# Patient Record
Sex: Female | Born: 1945
Health system: Southern US, Community
[De-identification: ages and names within clinical notes are randomized; demographics above are authoritative.]

## PROBLEM LIST (undated history)

## (undated) DIAGNOSIS — I10 Essential (primary) hypertension: Secondary | ICD-10-CM

## (undated) DIAGNOSIS — R42 Dizziness and giddiness: Secondary | ICD-10-CM

## (undated) DIAGNOSIS — K219 Gastro-esophageal reflux disease without esophagitis: Secondary | ICD-10-CM

## (undated) DIAGNOSIS — J45909 Unspecified asthma, uncomplicated: Secondary | ICD-10-CM

## (undated) DIAGNOSIS — E678 Other specified hyperalimentation: Secondary | ICD-10-CM

## (undated) DIAGNOSIS — Z8489 Family history of other specified conditions: Secondary | ICD-10-CM

## (undated) DIAGNOSIS — J449 Chronic obstructive pulmonary disease, unspecified: Secondary | ICD-10-CM

## (undated) DIAGNOSIS — M1610 Unilateral primary osteoarthritis, unspecified hip: Secondary | ICD-10-CM

## (undated) DIAGNOSIS — K900 Celiac disease: Secondary | ICD-10-CM

## (undated) DIAGNOSIS — J309 Allergic rhinitis, unspecified: Secondary | ICD-10-CM

## (undated) DIAGNOSIS — J328 Other chronic sinusitis: Secondary | ICD-10-CM

## (undated) DIAGNOSIS — M1990 Unspecified osteoarthritis, unspecified site: Secondary | ICD-10-CM

## (undated) DIAGNOSIS — M8580 Other specified disorders of bone density and structure, unspecified site: Secondary | ICD-10-CM

## (undated) DIAGNOSIS — J4489 Other specified chronic obstructive pulmonary disease: Secondary | ICD-10-CM

## (undated) HISTORY — DX: Other specified chronic obstructive pulmonary disease: J44.89

## (undated) HISTORY — DX: Other chronic sinusitis: J32.8

## (undated) HISTORY — PX: BREAST SURGERY: SHX581

## (undated) HISTORY — PX: DILATION AND CURETTAGE OF UTERUS: SHX78

## (undated) HISTORY — DX: Dizziness and giddiness: R42

## (undated) HISTORY — DX: Chronic obstructive pulmonary disease, unspecified: J44.9

## (undated) HISTORY — DX: Essential (primary) hypertension: I10

## (undated) HISTORY — DX: Other specified hyperalimentation: E67.8

## (undated) HISTORY — DX: Gastro-esophageal reflux disease without esophagitis: K21.9

## (undated) HISTORY — PX: TOTAL ABDOMINAL HYSTERECTOMY W/ BILATERAL SALPINGOOPHORECTOMY: SHX83

## (undated) HISTORY — DX: Allergic rhinitis, unspecified: J30.9

## (undated) HISTORY — PX: CATARACT EXTRACTION: SUR2

## (undated) HISTORY — DX: Other specified disorders of bone density and structure, unspecified site: M85.80

## (undated) HISTORY — DX: Unspecified asthma, uncomplicated: J45.909

## (undated) HISTORY — PX: BREAST BIOPSY: SHX20

## (undated) HISTORY — PX: EYE SURGERY: SHX253

---

## 1989-07-15 HISTORY — PX: ABDOMINAL HYSTERECTOMY: SHX81

## 1998-04-29 ENCOUNTER — Emergency Department (HOSPITAL_COMMUNITY): Admission: EM | Admit: 1998-04-29 | Discharge: 1998-04-29 | Payer: Self-pay | Admitting: Emergency Medicine

## 1998-11-05 ENCOUNTER — Emergency Department (HOSPITAL_COMMUNITY): Admission: EM | Admit: 1998-11-05 | Discharge: 1998-11-05 | Payer: Self-pay | Admitting: Emergency Medicine

## 1999-11-22 ENCOUNTER — Emergency Department (HOSPITAL_COMMUNITY): Admission: EM | Admit: 1999-11-22 | Discharge: 1999-11-22 | Payer: Self-pay | Admitting: Emergency Medicine

## 2000-01-14 ENCOUNTER — Encounter: Payer: Self-pay | Admitting: Internal Medicine

## 2000-01-14 ENCOUNTER — Ambulatory Visit (HOSPITAL_COMMUNITY): Admission: RE | Admit: 2000-01-14 | Discharge: 2000-01-14 | Payer: Self-pay | Admitting: Internal Medicine

## 2000-04-11 ENCOUNTER — Encounter: Admission: RE | Admit: 2000-04-11 | Discharge: 2000-04-11 | Payer: Self-pay | Admitting: Obstetrics and Gynecology

## 2000-04-11 ENCOUNTER — Encounter: Payer: Self-pay | Admitting: Obstetrics and Gynecology

## 2000-08-09 ENCOUNTER — Emergency Department (HOSPITAL_COMMUNITY): Admission: EM | Admit: 2000-08-09 | Discharge: 2000-08-09 | Payer: Self-pay | Admitting: Emergency Medicine

## 2000-12-03 ENCOUNTER — Ambulatory Visit (HOSPITAL_COMMUNITY): Admission: RE | Admit: 2000-12-03 | Discharge: 2000-12-04 | Payer: Self-pay | Admitting: Ophthalmology

## 2000-12-03 ENCOUNTER — Encounter: Payer: Self-pay | Admitting: Ophthalmology

## 2001-05-29 ENCOUNTER — Encounter: Admission: RE | Admit: 2001-05-29 | Discharge: 2001-05-29 | Payer: Self-pay | Admitting: Obstetrics and Gynecology

## 2001-05-29 ENCOUNTER — Encounter: Payer: Self-pay | Admitting: Obstetrics and Gynecology

## 2001-12-19 ENCOUNTER — Emergency Department (HOSPITAL_COMMUNITY): Admission: EM | Admit: 2001-12-19 | Discharge: 2001-12-19 | Payer: Self-pay | Admitting: *Deleted

## 2001-12-19 ENCOUNTER — Encounter: Payer: Self-pay | Admitting: Emergency Medicine

## 2002-08-27 ENCOUNTER — Encounter: Admission: RE | Admit: 2002-08-27 | Discharge: 2002-08-27 | Payer: Self-pay | Admitting: Obstetrics and Gynecology

## 2002-08-27 ENCOUNTER — Encounter: Payer: Self-pay | Admitting: Obstetrics and Gynecology

## 2003-06-16 ENCOUNTER — Emergency Department (HOSPITAL_COMMUNITY): Admission: EM | Admit: 2003-06-16 | Discharge: 2003-06-16 | Payer: Self-pay | Admitting: Emergency Medicine

## 2003-06-17 ENCOUNTER — Emergency Department (HOSPITAL_COMMUNITY): Admission: EM | Admit: 2003-06-17 | Discharge: 2003-06-17 | Payer: Self-pay | Admitting: Emergency Medicine

## 2003-12-16 ENCOUNTER — Encounter: Admission: RE | Admit: 2003-12-16 | Discharge: 2003-12-16 | Payer: Self-pay | Admitting: Obstetrics and Gynecology

## 2004-07-18 ENCOUNTER — Ambulatory Visit: Payer: Self-pay | Admitting: Internal Medicine

## 2004-08-01 ENCOUNTER — Ambulatory Visit: Payer: Self-pay | Admitting: Internal Medicine

## 2004-08-08 ENCOUNTER — Ambulatory Visit: Payer: Self-pay | Admitting: Internal Medicine

## 2004-08-15 ENCOUNTER — Ambulatory Visit: Payer: Self-pay | Admitting: Internal Medicine

## 2004-08-16 ENCOUNTER — Ambulatory Visit: Payer: Self-pay | Admitting: Internal Medicine

## 2004-08-28 ENCOUNTER — Ambulatory Visit: Payer: Self-pay | Admitting: Internal Medicine

## 2004-09-07 ENCOUNTER — Ambulatory Visit: Payer: Self-pay | Admitting: Internal Medicine

## 2004-09-21 ENCOUNTER — Ambulatory Visit: Payer: Self-pay | Admitting: Internal Medicine

## 2004-10-05 ENCOUNTER — Ambulatory Visit: Payer: Self-pay | Admitting: Internal Medicine

## 2004-10-12 ENCOUNTER — Ambulatory Visit: Payer: Self-pay | Admitting: Internal Medicine

## 2004-10-24 ENCOUNTER — Ambulatory Visit: Payer: Self-pay | Admitting: Internal Medicine

## 2004-11-01 ENCOUNTER — Ambulatory Visit: Payer: Self-pay | Admitting: Internal Medicine

## 2004-11-12 ENCOUNTER — Ambulatory Visit: Payer: Self-pay | Admitting: Internal Medicine

## 2004-11-14 ENCOUNTER — Ambulatory Visit: Payer: Self-pay | Admitting: Internal Medicine

## 2004-11-23 ENCOUNTER — Ambulatory Visit: Payer: Self-pay | Admitting: Internal Medicine

## 2004-11-29 ENCOUNTER — Ambulatory Visit: Payer: Self-pay | Admitting: Internal Medicine

## 2004-12-07 ENCOUNTER — Ambulatory Visit: Payer: Self-pay | Admitting: Internal Medicine

## 2004-12-20 ENCOUNTER — Ambulatory Visit: Payer: Self-pay | Admitting: Internal Medicine

## 2004-12-28 ENCOUNTER — Ambulatory Visit: Payer: Self-pay | Admitting: Internal Medicine

## 2004-12-31 ENCOUNTER — Ambulatory Visit: Payer: Self-pay | Admitting: Internal Medicine

## 2005-01-07 ENCOUNTER — Ambulatory Visit: Payer: Self-pay | Admitting: Internal Medicine

## 2005-01-09 ENCOUNTER — Encounter: Admission: RE | Admit: 2005-01-09 | Discharge: 2005-01-09 | Payer: Self-pay | Admitting: Obstetrics and Gynecology

## 2005-01-10 ENCOUNTER — Ambulatory Visit: Payer: Self-pay | Admitting: Internal Medicine

## 2005-01-29 ENCOUNTER — Ambulatory Visit: Payer: Self-pay | Admitting: Internal Medicine

## 2005-02-07 ENCOUNTER — Ambulatory Visit: Payer: Self-pay | Admitting: Internal Medicine

## 2005-02-11 ENCOUNTER — Ambulatory Visit: Payer: Self-pay | Admitting: Internal Medicine

## 2005-02-14 ENCOUNTER — Ambulatory Visit: Payer: Self-pay | Admitting: Internal Medicine

## 2005-02-22 ENCOUNTER — Emergency Department (HOSPITAL_COMMUNITY): Admission: EM | Admit: 2005-02-22 | Discharge: 2005-02-22 | Payer: Self-pay | Admitting: Emergency Medicine

## 2005-02-28 ENCOUNTER — Ambulatory Visit: Payer: Self-pay | Admitting: Internal Medicine

## 2005-03-07 ENCOUNTER — Ambulatory Visit: Payer: Self-pay | Admitting: Internal Medicine

## 2005-03-14 ENCOUNTER — Ambulatory Visit: Payer: Self-pay | Admitting: Internal Medicine

## 2005-03-22 ENCOUNTER — Ambulatory Visit: Payer: Self-pay | Admitting: Internal Medicine

## 2005-03-29 ENCOUNTER — Ambulatory Visit: Payer: Self-pay | Admitting: Internal Medicine

## 2005-04-12 ENCOUNTER — Ambulatory Visit: Payer: Self-pay | Admitting: Internal Medicine

## 2005-04-19 ENCOUNTER — Ambulatory Visit: Payer: Self-pay | Admitting: Internal Medicine

## 2005-04-23 ENCOUNTER — Ambulatory Visit: Payer: Self-pay | Admitting: Internal Medicine

## 2005-05-03 ENCOUNTER — Ambulatory Visit: Payer: Self-pay | Admitting: Internal Medicine

## 2005-05-06 ENCOUNTER — Ambulatory Visit: Payer: Self-pay | Admitting: Internal Medicine

## 2005-05-15 ENCOUNTER — Ambulatory Visit: Payer: Self-pay | Admitting: Internal Medicine

## 2005-05-22 ENCOUNTER — Emergency Department (HOSPITAL_COMMUNITY): Admission: EM | Admit: 2005-05-22 | Discharge: 2005-05-22 | Payer: Self-pay | Admitting: Emergency Medicine

## 2005-06-04 ENCOUNTER — Ambulatory Visit: Payer: Self-pay | Admitting: Internal Medicine

## 2005-06-14 ENCOUNTER — Ambulatory Visit: Payer: Self-pay | Admitting: Internal Medicine

## 2005-07-02 ENCOUNTER — Ambulatory Visit: Payer: Self-pay | Admitting: Internal Medicine

## 2005-07-29 ENCOUNTER — Ambulatory Visit: Payer: Self-pay | Admitting: Internal Medicine

## 2005-08-08 ENCOUNTER — Ambulatory Visit: Payer: Self-pay | Admitting: Internal Medicine

## 2005-08-16 ENCOUNTER — Ambulatory Visit: Payer: Self-pay | Admitting: Internal Medicine

## 2005-08-23 ENCOUNTER — Ambulatory Visit: Payer: Self-pay | Admitting: Internal Medicine

## 2005-09-20 ENCOUNTER — Ambulatory Visit: Payer: Self-pay | Admitting: Internal Medicine

## 2005-10-04 ENCOUNTER — Ambulatory Visit: Payer: Self-pay | Admitting: Internal Medicine

## 2005-10-11 ENCOUNTER — Ambulatory Visit: Payer: Self-pay | Admitting: Internal Medicine

## 2005-10-25 ENCOUNTER — Ambulatory Visit: Payer: Self-pay | Admitting: Internal Medicine

## 2005-11-08 ENCOUNTER — Ambulatory Visit: Payer: Self-pay | Admitting: Internal Medicine

## 2005-11-21 ENCOUNTER — Ambulatory Visit: Payer: Self-pay | Admitting: Internal Medicine

## 2005-12-05 ENCOUNTER — Ambulatory Visit: Payer: Self-pay | Admitting: Internal Medicine

## 2005-12-19 ENCOUNTER — Ambulatory Visit: Payer: Self-pay | Admitting: Internal Medicine

## 2005-12-27 ENCOUNTER — Ambulatory Visit: Payer: Self-pay | Admitting: Internal Medicine

## 2006-01-03 ENCOUNTER — Ambulatory Visit: Payer: Self-pay | Admitting: Internal Medicine

## 2006-01-10 ENCOUNTER — Ambulatory Visit: Payer: Self-pay | Admitting: Internal Medicine

## 2006-01-24 ENCOUNTER — Ambulatory Visit: Payer: Self-pay | Admitting: Internal Medicine

## 2006-01-30 ENCOUNTER — Encounter: Admission: RE | Admit: 2006-01-30 | Discharge: 2006-01-30 | Payer: Self-pay | Admitting: Obstetrics and Gynecology

## 2006-02-07 ENCOUNTER — Ambulatory Visit: Payer: Self-pay | Admitting: Internal Medicine

## 2006-02-14 ENCOUNTER — Ambulatory Visit: Payer: Self-pay | Admitting: Internal Medicine

## 2006-02-21 ENCOUNTER — Ambulatory Visit: Payer: Self-pay | Admitting: Internal Medicine

## 2006-02-28 ENCOUNTER — Ambulatory Visit: Payer: Self-pay | Admitting: Internal Medicine

## 2006-03-14 ENCOUNTER — Ambulatory Visit: Payer: Self-pay | Admitting: Internal Medicine

## 2006-03-19 ENCOUNTER — Ambulatory Visit: Payer: Self-pay | Admitting: Internal Medicine

## 2006-03-21 ENCOUNTER — Ambulatory Visit: Payer: Self-pay | Admitting: Internal Medicine

## 2006-04-04 ENCOUNTER — Ambulatory Visit: Payer: Self-pay | Admitting: Internal Medicine

## 2006-04-11 ENCOUNTER — Ambulatory Visit: Payer: Self-pay | Admitting: Internal Medicine

## 2006-04-25 ENCOUNTER — Ambulatory Visit: Payer: Self-pay | Admitting: Internal Medicine

## 2006-05-02 ENCOUNTER — Ambulatory Visit: Payer: Self-pay | Admitting: Internal Medicine

## 2006-05-16 ENCOUNTER — Ambulatory Visit: Payer: Self-pay | Admitting: Internal Medicine

## 2006-05-19 ENCOUNTER — Ambulatory Visit: Payer: Self-pay | Admitting: Internal Medicine

## 2006-05-22 ENCOUNTER — Ambulatory Visit: Payer: Self-pay | Admitting: Internal Medicine

## 2006-06-04 ENCOUNTER — Ambulatory Visit: Payer: Self-pay | Admitting: Internal Medicine

## 2006-06-20 ENCOUNTER — Ambulatory Visit: Payer: Self-pay | Admitting: Internal Medicine

## 2006-06-27 ENCOUNTER — Ambulatory Visit: Payer: Self-pay | Admitting: Internal Medicine

## 2006-07-04 ENCOUNTER — Ambulatory Visit: Payer: Self-pay | Admitting: Internal Medicine

## 2006-07-11 ENCOUNTER — Ambulatory Visit: Payer: Self-pay | Admitting: Internal Medicine

## 2006-07-18 ENCOUNTER — Ambulatory Visit: Payer: Self-pay | Admitting: Internal Medicine

## 2006-07-25 ENCOUNTER — Ambulatory Visit: Payer: Self-pay | Admitting: Internal Medicine

## 2006-08-12 ENCOUNTER — Ambulatory Visit: Payer: Self-pay | Admitting: Internal Medicine

## 2006-08-22 ENCOUNTER — Ambulatory Visit: Payer: Self-pay | Admitting: Internal Medicine

## 2006-08-29 ENCOUNTER — Ambulatory Visit: Payer: Self-pay | Admitting: Internal Medicine

## 2006-09-05 ENCOUNTER — Ambulatory Visit: Payer: Self-pay | Admitting: Internal Medicine

## 2006-09-12 ENCOUNTER — Ambulatory Visit: Payer: Self-pay | Admitting: Internal Medicine

## 2006-09-19 ENCOUNTER — Ambulatory Visit: Payer: Self-pay | Admitting: Internal Medicine

## 2006-09-26 ENCOUNTER — Ambulatory Visit: Payer: Self-pay | Admitting: Internal Medicine

## 2006-10-02 ENCOUNTER — Ambulatory Visit: Payer: Self-pay | Admitting: Internal Medicine

## 2006-10-10 ENCOUNTER — Ambulatory Visit: Payer: Self-pay | Admitting: Internal Medicine

## 2006-10-17 ENCOUNTER — Ambulatory Visit: Payer: Self-pay | Admitting: Internal Medicine

## 2006-10-24 ENCOUNTER — Ambulatory Visit: Payer: Self-pay | Admitting: Internal Medicine

## 2006-10-27 ENCOUNTER — Ambulatory Visit: Payer: Self-pay | Admitting: Internal Medicine

## 2006-10-31 ENCOUNTER — Ambulatory Visit: Payer: Self-pay | Admitting: Internal Medicine

## 2006-11-07 ENCOUNTER — Ambulatory Visit: Payer: Self-pay | Admitting: Internal Medicine

## 2006-11-14 ENCOUNTER — Ambulatory Visit: Payer: Self-pay | Admitting: Internal Medicine

## 2006-11-21 ENCOUNTER — Ambulatory Visit: Payer: Self-pay | Admitting: Internal Medicine

## 2006-11-28 ENCOUNTER — Ambulatory Visit: Payer: Self-pay | Admitting: Internal Medicine

## 2006-12-05 ENCOUNTER — Ambulatory Visit: Payer: Self-pay | Admitting: Internal Medicine

## 2006-12-12 ENCOUNTER — Ambulatory Visit: Payer: Self-pay | Admitting: Internal Medicine

## 2006-12-19 ENCOUNTER — Ambulatory Visit: Payer: Self-pay | Admitting: Internal Medicine

## 2006-12-26 ENCOUNTER — Ambulatory Visit: Payer: Self-pay | Admitting: Internal Medicine

## 2007-01-02 ENCOUNTER — Ambulatory Visit: Payer: Self-pay | Admitting: Internal Medicine

## 2007-01-03 ENCOUNTER — Emergency Department (HOSPITAL_COMMUNITY): Admission: EM | Admit: 2007-01-03 | Discharge: 2007-01-03 | Payer: Self-pay | Admitting: *Deleted

## 2007-01-09 ENCOUNTER — Ambulatory Visit: Payer: Self-pay | Admitting: Internal Medicine

## 2007-01-15 ENCOUNTER — Ambulatory Visit: Payer: Self-pay | Admitting: Internal Medicine

## 2007-01-19 ENCOUNTER — Ambulatory Visit: Payer: Self-pay | Admitting: Internal Medicine

## 2007-01-23 ENCOUNTER — Ambulatory Visit: Payer: Self-pay | Admitting: Internal Medicine

## 2007-01-26 ENCOUNTER — Ambulatory Visit: Payer: Self-pay | Admitting: Internal Medicine

## 2007-01-30 ENCOUNTER — Ambulatory Visit: Payer: Self-pay | Admitting: Internal Medicine

## 2007-02-06 ENCOUNTER — Ambulatory Visit: Payer: Self-pay | Admitting: Internal Medicine

## 2007-02-13 ENCOUNTER — Ambulatory Visit: Payer: Self-pay | Admitting: Internal Medicine

## 2007-02-20 ENCOUNTER — Ambulatory Visit: Payer: Self-pay | Admitting: Internal Medicine

## 2007-02-27 ENCOUNTER — Ambulatory Visit: Payer: Self-pay | Admitting: Internal Medicine

## 2007-03-06 ENCOUNTER — Encounter: Admission: RE | Admit: 2007-03-06 | Discharge: 2007-03-06 | Payer: Self-pay | Admitting: Obstetrics and Gynecology

## 2007-03-06 ENCOUNTER — Ambulatory Visit: Payer: Self-pay | Admitting: Internal Medicine

## 2007-03-13 ENCOUNTER — Ambulatory Visit: Payer: Self-pay | Admitting: Internal Medicine

## 2007-03-27 ENCOUNTER — Ambulatory Visit: Payer: Self-pay | Admitting: Internal Medicine

## 2007-04-03 ENCOUNTER — Ambulatory Visit: Payer: Self-pay | Admitting: Internal Medicine

## 2007-04-10 ENCOUNTER — Ambulatory Visit: Payer: Self-pay | Admitting: Internal Medicine

## 2007-04-17 ENCOUNTER — Ambulatory Visit: Payer: Self-pay | Admitting: Internal Medicine

## 2007-04-20 ENCOUNTER — Ambulatory Visit: Payer: Self-pay | Admitting: Internal Medicine

## 2007-04-24 ENCOUNTER — Ambulatory Visit: Payer: Self-pay | Admitting: Internal Medicine

## 2007-05-01 ENCOUNTER — Ambulatory Visit: Payer: Self-pay | Admitting: Internal Medicine

## 2007-05-05 ENCOUNTER — Ambulatory Visit: Payer: Self-pay | Admitting: Pulmonary Disease

## 2007-05-08 ENCOUNTER — Ambulatory Visit: Payer: Self-pay | Admitting: Internal Medicine

## 2007-05-15 ENCOUNTER — Ambulatory Visit: Payer: Self-pay | Admitting: Internal Medicine

## 2007-05-29 ENCOUNTER — Ambulatory Visit: Payer: Self-pay | Admitting: Internal Medicine

## 2007-06-03 ENCOUNTER — Ambulatory Visit: Payer: Self-pay | Admitting: Pulmonary Disease

## 2007-06-12 ENCOUNTER — Ambulatory Visit: Payer: Self-pay | Admitting: Internal Medicine

## 2007-06-19 ENCOUNTER — Ambulatory Visit: Payer: Self-pay | Admitting: Internal Medicine

## 2007-06-26 ENCOUNTER — Ambulatory Visit: Payer: Self-pay | Admitting: Internal Medicine

## 2007-06-29 ENCOUNTER — Ambulatory Visit: Payer: Self-pay | Admitting: Internal Medicine

## 2007-07-06 ENCOUNTER — Ambulatory Visit: Payer: Self-pay | Admitting: Internal Medicine

## 2007-07-17 ENCOUNTER — Ambulatory Visit: Payer: Self-pay | Admitting: Internal Medicine

## 2007-07-22 DIAGNOSIS — J4489 Other specified chronic obstructive pulmonary disease: Secondary | ICD-10-CM | POA: Insufficient documentation

## 2007-07-22 DIAGNOSIS — E678 Other specified hyperalimentation: Secondary | ICD-10-CM | POA: Insufficient documentation

## 2007-07-22 DIAGNOSIS — J328 Other chronic sinusitis: Secondary | ICD-10-CM | POA: Insufficient documentation

## 2007-07-22 DIAGNOSIS — J309 Allergic rhinitis, unspecified: Secondary | ICD-10-CM | POA: Insufficient documentation

## 2007-07-22 DIAGNOSIS — J449 Chronic obstructive pulmonary disease, unspecified: Secondary | ICD-10-CM | POA: Insufficient documentation

## 2007-07-22 DIAGNOSIS — L509 Urticaria, unspecified: Secondary | ICD-10-CM | POA: Insufficient documentation

## 2007-07-27 ENCOUNTER — Encounter: Payer: Self-pay | Admitting: Internal Medicine

## 2007-07-28 ENCOUNTER — Ambulatory Visit: Payer: Self-pay | Admitting: Internal Medicine

## 2007-08-14 ENCOUNTER — Ambulatory Visit: Payer: Self-pay | Admitting: Internal Medicine

## 2007-08-21 ENCOUNTER — Ambulatory Visit: Payer: Self-pay | Admitting: Internal Medicine

## 2007-08-25 ENCOUNTER — Encounter: Payer: Self-pay | Admitting: Internal Medicine

## 2007-08-28 ENCOUNTER — Ambulatory Visit: Payer: Self-pay | Admitting: Internal Medicine

## 2007-09-04 ENCOUNTER — Ambulatory Visit: Payer: Self-pay | Admitting: Internal Medicine

## 2007-09-11 ENCOUNTER — Ambulatory Visit: Payer: Self-pay | Admitting: Internal Medicine

## 2007-09-18 ENCOUNTER — Ambulatory Visit: Payer: Self-pay | Admitting: Internal Medicine

## 2007-09-25 ENCOUNTER — Ambulatory Visit: Payer: Self-pay | Admitting: Internal Medicine

## 2007-10-02 ENCOUNTER — Ambulatory Visit: Payer: Self-pay | Admitting: Internal Medicine

## 2007-10-09 ENCOUNTER — Ambulatory Visit: Payer: Self-pay | Admitting: Internal Medicine

## 2007-10-12 ENCOUNTER — Ambulatory Visit: Payer: Self-pay | Admitting: Internal Medicine

## 2007-10-16 ENCOUNTER — Ambulatory Visit: Payer: Self-pay | Admitting: Internal Medicine

## 2007-10-22 ENCOUNTER — Ambulatory Visit: Payer: Self-pay | Admitting: Internal Medicine

## 2007-10-30 ENCOUNTER — Ambulatory Visit: Payer: Self-pay | Admitting: Internal Medicine

## 2007-11-06 ENCOUNTER — Ambulatory Visit: Payer: Self-pay | Admitting: Internal Medicine

## 2007-11-13 ENCOUNTER — Ambulatory Visit: Payer: Self-pay | Admitting: Internal Medicine

## 2007-11-20 ENCOUNTER — Ambulatory Visit: Payer: Self-pay | Admitting: Internal Medicine

## 2007-11-27 ENCOUNTER — Ambulatory Visit: Payer: Self-pay | Admitting: Internal Medicine

## 2007-12-08 ENCOUNTER — Ambulatory Visit: Payer: Self-pay | Admitting: Internal Medicine

## 2007-12-17 ENCOUNTER — Ambulatory Visit: Payer: Self-pay | Admitting: Internal Medicine

## 2007-12-18 ENCOUNTER — Ambulatory Visit: Payer: Self-pay | Admitting: Internal Medicine

## 2007-12-24 ENCOUNTER — Ambulatory Visit: Payer: Self-pay | Admitting: Internal Medicine

## 2007-12-31 ENCOUNTER — Ambulatory Visit: Payer: Self-pay | Admitting: Internal Medicine

## 2008-01-08 ENCOUNTER — Ambulatory Visit: Payer: Self-pay | Admitting: Internal Medicine

## 2008-01-13 ENCOUNTER — Ambulatory Visit: Payer: Self-pay | Admitting: Internal Medicine

## 2008-01-22 ENCOUNTER — Ambulatory Visit: Payer: Self-pay | Admitting: Internal Medicine

## 2008-01-29 ENCOUNTER — Ambulatory Visit: Payer: Self-pay | Admitting: Internal Medicine

## 2008-02-03 ENCOUNTER — Ambulatory Visit: Payer: Self-pay | Admitting: Internal Medicine

## 2008-02-03 DIAGNOSIS — J019 Acute sinusitis, unspecified: Secondary | ICD-10-CM | POA: Insufficient documentation

## 2008-02-05 ENCOUNTER — Ambulatory Visit: Payer: Self-pay | Admitting: Internal Medicine

## 2008-02-12 ENCOUNTER — Ambulatory Visit: Payer: Self-pay | Admitting: Internal Medicine

## 2008-02-19 ENCOUNTER — Ambulatory Visit: Payer: Self-pay | Admitting: Internal Medicine

## 2008-02-26 ENCOUNTER — Ambulatory Visit: Payer: Self-pay | Admitting: Internal Medicine

## 2008-03-04 ENCOUNTER — Ambulatory Visit: Payer: Self-pay | Admitting: Internal Medicine

## 2008-03-11 ENCOUNTER — Ambulatory Visit: Payer: Self-pay | Admitting: Internal Medicine

## 2008-03-18 ENCOUNTER — Ambulatory Visit: Payer: Self-pay | Admitting: Internal Medicine

## 2008-03-25 ENCOUNTER — Ambulatory Visit: Payer: Self-pay | Admitting: Internal Medicine

## 2008-04-01 ENCOUNTER — Ambulatory Visit: Payer: Self-pay | Admitting: Internal Medicine

## 2008-04-08 ENCOUNTER — Ambulatory Visit: Payer: Self-pay | Admitting: Internal Medicine

## 2008-04-08 ENCOUNTER — Encounter: Admission: RE | Admit: 2008-04-08 | Discharge: 2008-04-08 | Payer: Self-pay | Admitting: Obstetrics and Gynecology

## 2008-04-15 ENCOUNTER — Ambulatory Visit: Payer: Self-pay | Admitting: Internal Medicine

## 2008-04-29 ENCOUNTER — Ambulatory Visit: Payer: Self-pay | Admitting: Internal Medicine

## 2008-05-06 ENCOUNTER — Ambulatory Visit: Payer: Self-pay | Admitting: Internal Medicine

## 2008-05-20 ENCOUNTER — Ambulatory Visit: Payer: Self-pay | Admitting: Internal Medicine

## 2008-05-27 ENCOUNTER — Ambulatory Visit: Payer: Self-pay | Admitting: Internal Medicine

## 2008-05-31 ENCOUNTER — Ambulatory Visit: Payer: Self-pay | Admitting: Internal Medicine

## 2008-06-03 ENCOUNTER — Ambulatory Visit: Payer: Self-pay | Admitting: Internal Medicine

## 2008-06-17 ENCOUNTER — Ambulatory Visit: Payer: Self-pay | Admitting: Internal Medicine

## 2008-06-24 ENCOUNTER — Ambulatory Visit: Payer: Self-pay | Admitting: Internal Medicine

## 2008-07-01 ENCOUNTER — Ambulatory Visit: Payer: Self-pay | Admitting: Internal Medicine

## 2008-07-07 ENCOUNTER — Ambulatory Visit: Payer: Self-pay | Admitting: Internal Medicine

## 2008-07-14 ENCOUNTER — Ambulatory Visit: Payer: Self-pay | Admitting: Internal Medicine

## 2008-07-22 ENCOUNTER — Ambulatory Visit: Payer: Self-pay | Admitting: Internal Medicine

## 2008-07-25 ENCOUNTER — Ambulatory Visit: Payer: Self-pay | Admitting: Internal Medicine

## 2008-07-29 ENCOUNTER — Ambulatory Visit: Payer: Self-pay | Admitting: Internal Medicine

## 2008-08-05 ENCOUNTER — Ambulatory Visit: Payer: Self-pay | Admitting: Internal Medicine

## 2008-08-12 ENCOUNTER — Ambulatory Visit: Payer: Self-pay | Admitting: Internal Medicine

## 2008-08-24 ENCOUNTER — Ambulatory Visit: Payer: Self-pay | Admitting: Internal Medicine

## 2008-09-02 ENCOUNTER — Ambulatory Visit: Payer: Self-pay | Admitting: Internal Medicine

## 2008-09-09 ENCOUNTER — Ambulatory Visit: Payer: Self-pay | Admitting: Internal Medicine

## 2008-09-16 ENCOUNTER — Ambulatory Visit: Payer: Self-pay | Admitting: Internal Medicine

## 2008-09-23 ENCOUNTER — Ambulatory Visit: Payer: Self-pay | Admitting: Internal Medicine

## 2008-09-30 ENCOUNTER — Ambulatory Visit: Payer: Self-pay | Admitting: Internal Medicine

## 2008-10-04 ENCOUNTER — Emergency Department (HOSPITAL_COMMUNITY): Admission: EM | Admit: 2008-10-04 | Discharge: 2008-10-04 | Payer: Self-pay | Admitting: Emergency Medicine

## 2008-10-07 ENCOUNTER — Ambulatory Visit: Payer: Self-pay | Admitting: Internal Medicine

## 2008-10-13 ENCOUNTER — Ambulatory Visit: Payer: Self-pay | Admitting: Internal Medicine

## 2008-10-17 ENCOUNTER — Ambulatory Visit: Payer: Self-pay | Admitting: Internal Medicine

## 2008-10-21 ENCOUNTER — Telehealth (INDEPENDENT_AMBULATORY_CARE_PROVIDER_SITE_OTHER): Payer: Self-pay | Admitting: *Deleted

## 2008-10-21 ENCOUNTER — Ambulatory Visit: Payer: Self-pay | Admitting: Internal Medicine

## 2008-10-28 ENCOUNTER — Ambulatory Visit: Payer: Self-pay | Admitting: Internal Medicine

## 2008-11-04 ENCOUNTER — Ambulatory Visit: Payer: Self-pay | Admitting: Internal Medicine

## 2008-11-11 ENCOUNTER — Ambulatory Visit: Payer: Self-pay | Admitting: Internal Medicine

## 2008-11-17 ENCOUNTER — Ambulatory Visit: Payer: Self-pay | Admitting: Internal Medicine

## 2008-11-24 ENCOUNTER — Ambulatory Visit: Payer: Self-pay | Admitting: Internal Medicine

## 2008-12-01 ENCOUNTER — Ambulatory Visit: Payer: Self-pay | Admitting: Internal Medicine

## 2008-12-16 ENCOUNTER — Ambulatory Visit: Payer: Self-pay | Admitting: Internal Medicine

## 2008-12-23 ENCOUNTER — Ambulatory Visit: Payer: Self-pay | Admitting: Internal Medicine

## 2009-01-06 ENCOUNTER — Ambulatory Visit: Payer: Self-pay | Admitting: Internal Medicine

## 2009-01-12 ENCOUNTER — Ambulatory Visit: Payer: Self-pay | Admitting: Internal Medicine

## 2009-01-19 ENCOUNTER — Ambulatory Visit: Payer: Self-pay | Admitting: Internal Medicine

## 2009-01-27 ENCOUNTER — Ambulatory Visit: Payer: Self-pay | Admitting: Internal Medicine

## 2009-02-02 ENCOUNTER — Ambulatory Visit: Payer: Self-pay | Admitting: Internal Medicine

## 2009-02-03 ENCOUNTER — Ambulatory Visit: Payer: Self-pay | Admitting: Internal Medicine

## 2009-02-10 ENCOUNTER — Ambulatory Visit: Payer: Self-pay | Admitting: Internal Medicine

## 2009-02-17 ENCOUNTER — Ambulatory Visit: Payer: Self-pay | Admitting: Internal Medicine

## 2009-03-03 ENCOUNTER — Ambulatory Visit: Payer: Self-pay | Admitting: Internal Medicine

## 2009-03-10 ENCOUNTER — Telehealth: Payer: Self-pay | Admitting: Internal Medicine

## 2009-03-10 ENCOUNTER — Ambulatory Visit: Payer: Self-pay | Admitting: Internal Medicine

## 2009-03-16 ENCOUNTER — Telehealth: Payer: Self-pay | Admitting: Internal Medicine

## 2009-03-17 ENCOUNTER — Ambulatory Visit: Payer: Self-pay | Admitting: Internal Medicine

## 2009-03-24 ENCOUNTER — Ambulatory Visit: Payer: Self-pay | Admitting: Internal Medicine

## 2009-03-31 ENCOUNTER — Ambulatory Visit: Payer: Self-pay | Admitting: Internal Medicine

## 2009-04-07 ENCOUNTER — Ambulatory Visit: Payer: Self-pay | Admitting: Internal Medicine

## 2009-04-14 ENCOUNTER — Ambulatory Visit: Payer: Self-pay | Admitting: Internal Medicine

## 2009-04-17 ENCOUNTER — Ambulatory Visit: Payer: Self-pay | Admitting: Internal Medicine

## 2009-04-21 ENCOUNTER — Ambulatory Visit: Payer: Self-pay | Admitting: Internal Medicine

## 2009-04-28 ENCOUNTER — Ambulatory Visit: Payer: Self-pay | Admitting: Internal Medicine

## 2009-05-05 ENCOUNTER — Ambulatory Visit: Payer: Self-pay | Admitting: Internal Medicine

## 2009-05-05 ENCOUNTER — Encounter: Admission: RE | Admit: 2009-05-05 | Discharge: 2009-05-05 | Payer: Self-pay | Admitting: Obstetrics and Gynecology

## 2009-05-12 ENCOUNTER — Ambulatory Visit: Payer: Self-pay | Admitting: Internal Medicine

## 2009-05-25 ENCOUNTER — Ambulatory Visit: Payer: Self-pay | Admitting: Internal Medicine

## 2009-06-02 ENCOUNTER — Ambulatory Visit: Payer: Self-pay | Admitting: Internal Medicine

## 2009-06-09 ENCOUNTER — Ambulatory Visit: Payer: Self-pay | Admitting: Internal Medicine

## 2009-06-16 ENCOUNTER — Ambulatory Visit: Payer: Self-pay | Admitting: Internal Medicine

## 2009-06-23 ENCOUNTER — Ambulatory Visit: Payer: Self-pay | Admitting: Internal Medicine

## 2009-06-30 ENCOUNTER — Ambulatory Visit: Payer: Self-pay | Admitting: Internal Medicine

## 2009-07-06 ENCOUNTER — Ambulatory Visit: Payer: Self-pay | Admitting: Internal Medicine

## 2009-07-13 ENCOUNTER — Ambulatory Visit: Payer: Self-pay | Admitting: Internal Medicine

## 2009-07-21 ENCOUNTER — Ambulatory Visit: Payer: Self-pay | Admitting: Internal Medicine

## 2009-07-24 ENCOUNTER — Ambulatory Visit: Payer: Self-pay | Admitting: Internal Medicine

## 2009-07-28 ENCOUNTER — Ambulatory Visit: Payer: Self-pay | Admitting: Internal Medicine

## 2009-08-04 ENCOUNTER — Ambulatory Visit: Payer: Self-pay | Admitting: Internal Medicine

## 2009-08-11 ENCOUNTER — Ambulatory Visit: Payer: Self-pay | Admitting: Internal Medicine

## 2009-08-12 ENCOUNTER — Emergency Department (HOSPITAL_COMMUNITY): Admission: EM | Admit: 2009-08-12 | Discharge: 2009-08-12 | Payer: Self-pay | Admitting: Emergency Medicine

## 2009-08-18 ENCOUNTER — Ambulatory Visit: Payer: Self-pay | Admitting: Internal Medicine

## 2009-08-21 ENCOUNTER — Ambulatory Visit: Payer: Self-pay | Admitting: Internal Medicine

## 2009-08-21 DIAGNOSIS — K219 Gastro-esophageal reflux disease without esophagitis: Secondary | ICD-10-CM | POA: Insufficient documentation

## 2009-08-25 ENCOUNTER — Ambulatory Visit: Payer: Self-pay | Admitting: Internal Medicine

## 2009-09-01 ENCOUNTER — Ambulatory Visit: Payer: Self-pay | Admitting: Internal Medicine

## 2009-09-18 ENCOUNTER — Ambulatory Visit: Payer: Self-pay | Admitting: Internal Medicine

## 2009-09-20 ENCOUNTER — Encounter: Admission: RE | Admit: 2009-09-20 | Discharge: 2009-09-20 | Payer: Self-pay | Admitting: Internal Medicine

## 2009-09-25 ENCOUNTER — Ambulatory Visit: Payer: Self-pay | Admitting: Internal Medicine

## 2009-10-04 ENCOUNTER — Ambulatory Visit: Payer: Self-pay | Admitting: Internal Medicine

## 2009-10-13 ENCOUNTER — Ambulatory Visit: Payer: Self-pay | Admitting: Internal Medicine

## 2009-10-23 ENCOUNTER — Ambulatory Visit: Payer: Self-pay | Admitting: Internal Medicine

## 2009-11-01 ENCOUNTER — Ambulatory Visit: Payer: Self-pay | Admitting: Internal Medicine

## 2009-11-10 ENCOUNTER — Ambulatory Visit: Payer: Self-pay | Admitting: Internal Medicine

## 2009-11-16 ENCOUNTER — Ambulatory Visit: Payer: Self-pay | Admitting: Internal Medicine

## 2009-11-24 ENCOUNTER — Ambulatory Visit: Payer: Self-pay | Admitting: Internal Medicine

## 2009-12-01 ENCOUNTER — Ambulatory Visit: Payer: Self-pay | Admitting: Internal Medicine

## 2009-12-08 ENCOUNTER — Ambulatory Visit: Payer: Self-pay | Admitting: Internal Medicine

## 2009-12-18 ENCOUNTER — Ambulatory Visit: Payer: Self-pay | Admitting: Internal Medicine

## 2009-12-19 ENCOUNTER — Ambulatory Visit: Payer: Self-pay | Admitting: Internal Medicine

## 2009-12-27 ENCOUNTER — Ambulatory Visit: Payer: Self-pay | Admitting: Internal Medicine

## 2010-01-04 ENCOUNTER — Ambulatory Visit: Payer: Self-pay | Admitting: Internal Medicine

## 2010-01-11 ENCOUNTER — Ambulatory Visit: Payer: Self-pay | Admitting: Internal Medicine

## 2010-01-19 ENCOUNTER — Ambulatory Visit: Payer: Self-pay | Admitting: Internal Medicine

## 2010-01-22 ENCOUNTER — Ambulatory Visit: Payer: Self-pay | Admitting: Internal Medicine

## 2010-02-01 ENCOUNTER — Ambulatory Visit: Payer: Self-pay | Admitting: Internal Medicine

## 2010-02-09 ENCOUNTER — Ambulatory Visit: Payer: Self-pay | Admitting: Internal Medicine

## 2010-02-16 ENCOUNTER — Ambulatory Visit: Payer: Self-pay | Admitting: Internal Medicine

## 2010-02-23 ENCOUNTER — Ambulatory Visit: Payer: Self-pay | Admitting: Internal Medicine

## 2010-03-02 ENCOUNTER — Ambulatory Visit: Payer: Self-pay | Admitting: Internal Medicine

## 2010-03-09 ENCOUNTER — Ambulatory Visit: Payer: Self-pay | Admitting: Internal Medicine

## 2010-03-16 ENCOUNTER — Ambulatory Visit: Payer: Self-pay | Admitting: Internal Medicine

## 2010-03-23 ENCOUNTER — Ambulatory Visit: Payer: Self-pay | Admitting: Internal Medicine

## 2010-03-30 ENCOUNTER — Ambulatory Visit: Payer: Self-pay | Admitting: Internal Medicine

## 2010-04-11 ENCOUNTER — Ambulatory Visit: Payer: Self-pay | Admitting: Internal Medicine

## 2010-04-20 ENCOUNTER — Ambulatory Visit: Payer: Self-pay | Admitting: Internal Medicine

## 2010-04-27 ENCOUNTER — Ambulatory Visit: Payer: Self-pay | Admitting: Internal Medicine

## 2010-05-04 ENCOUNTER — Ambulatory Visit: Payer: Self-pay | Admitting: Internal Medicine

## 2010-05-11 ENCOUNTER — Ambulatory Visit: Payer: Self-pay | Admitting: Internal Medicine

## 2010-05-18 ENCOUNTER — Ambulatory Visit: Payer: Self-pay | Admitting: Internal Medicine

## 2010-05-25 ENCOUNTER — Ambulatory Visit: Payer: Self-pay | Admitting: Internal Medicine

## 2010-06-01 ENCOUNTER — Ambulatory Visit: Payer: Self-pay | Admitting: Internal Medicine

## 2010-06-08 ENCOUNTER — Ambulatory Visit: Payer: Self-pay | Admitting: Internal Medicine

## 2010-06-15 ENCOUNTER — Ambulatory Visit: Payer: Self-pay | Admitting: Internal Medicine

## 2010-06-19 ENCOUNTER — Telehealth: Payer: Self-pay | Admitting: Internal Medicine

## 2010-06-22 ENCOUNTER — Ambulatory Visit: Payer: Self-pay | Admitting: Internal Medicine

## 2010-06-29 ENCOUNTER — Encounter
Admission: RE | Admit: 2010-06-29 | Discharge: 2010-06-29 | Payer: Self-pay | Source: Home / Self Care | Attending: Obstetrics and Gynecology | Admitting: Obstetrics and Gynecology

## 2010-06-29 ENCOUNTER — Ambulatory Visit: Payer: Self-pay | Admitting: Internal Medicine

## 2010-07-13 ENCOUNTER — Ambulatory Visit: Payer: Self-pay | Admitting: Internal Medicine

## 2010-07-24 ENCOUNTER — Ambulatory Visit: Admit: 2010-07-24 | Payer: Self-pay | Admitting: Internal Medicine

## 2010-07-30 ENCOUNTER — Ambulatory Visit: Payer: Self-pay | Admitting: Internal Medicine

## 2010-08-03 ENCOUNTER — Ambulatory Visit: Payer: Self-pay | Admitting: Internal Medicine

## 2010-08-08 ENCOUNTER — Ambulatory Visit: Payer: Self-pay | Admitting: Internal Medicine

## 2010-08-09 ENCOUNTER — Ambulatory Visit: Payer: Self-pay | Admitting: Internal Medicine

## 2010-08-13 ENCOUNTER — Emergency Department (HOSPITAL_COMMUNITY)
Admission: EM | Admit: 2010-08-13 | Discharge: 2010-08-13 | Payer: Self-pay | Source: Home / Self Care | Admitting: Emergency Medicine

## 2010-08-14 NOTE — Assessment & Plan Note (Signed)
Summary: 4 month return/mhh   Primary Provider/Referring Provider:  Kellie Shropshire  CC:  4 month follow up visit-? Sinus Infection.Marland Kitchen  History of Present Illness: Nov 17, 2008--Presents for an acute office visit.  c/o cough-yellow x 1 day, sob, wheezing, felt like running fever last pm,right ear pain today, wheezing. seen by primary 4/22, rx amoxciillin 500mg  2 by mouth two times a day x 14 d, clarithromycin two times a day for 2 weeks.  Some improvement but still coughing and intermittent wheezing. Denies chest pain, orthopnea, hemoptysis, fever, n/v/d, edema, headache.   2008/12/22- Allergic rhinitis, asthma/ copd Treated last week for broncitis that began with itching. On pred taper she opened up and got a lot of yellow brown mucus from head and chest. Now still week and  cough stil productive, but doing very much better. CXR- bronchitis changes. Reviewed. Mainly still notices sinus pressure. Note BP -just switched from Diovan to Benicar HCT. Likes zyrtec but Singulair no help.  April 17, 2009- Allergic rhinitis, asthma/ copd Two weeks of "allergy" meaning watery eyes and nose with itching. Denies postnasal drip, sore throat, cough or wheeze. No hives in a long time. Zyrtec helps but makes her drowsy. Dropped off singulair.  August 21, 2009- Allergic rhinitis, chronic sinusitis, asthma/ copd Went to UC last week for sinusitis. Given meloxicam for associated shoulder pain- used as needed. Given amoxacillin 500 three times a day x 10 days which she stopped because of upset stomach. Feels "water flowing in back of ear". We had directed to ENT in the past but she had not wanted surgery. She tolerated amoxacillin in past by combining it then with lansoprazole.  Current Medications (verified): 1)  Proair Hfa 108 (90 Base) Mcg/act  Aers (Albuterol Sulfate) .... 2 Puffs Four Times A Day As Needed 2)  Allergy Vaccine A 1:500(0.3);  / B 1:50,000, 0.3 Gh .... Once Weekly 3)  Zyrtec Allergy 10 Mg  Tabs (Cetirizine Hcl) .... Take 1 Tablet By Mouth Once A Day 4)  Estradiol 1 Mg  Tabs (Estradiol) .... Take 1 Tablet By Mouth Once A Day 5)  Benicar Hct 40-25 Mg Tabs (Olmesartan Medoxomil-Hctz) .Marland Kitchen.. 1 Once Daily 6)  Tylenol 325 Mg Tabs (Acetaminophen) .... As Needed By Mouth  Allergies (verified): 1)  ! * Shellfish  Past History:  Past Medical History: Last updated: 02/03/2008 URTICARIA (ICD-708.9) OBESITY HYPOVENTILATION SYNDROME (ICD-278.8) RHINOSINUSITIS, CHRONIC (ICD-473.8) ALLERGIC RHINITIS (ICD-477.9) ASTHMA (ICD-493.90) COPD (ICD-496)    Past Surgical History: Last updated: 12-22-2008 T A H and B S O  Family History: Last updated: 12/22/2008 Mother- CHF Father- died pancreatic cancer  Social History: Last updated: 22-Dec-2008 Patient never smoked.  Married Retired Warehouse manager work  Risk Factors: Smoking Status: never (2008/12/22)  Review of Systems      See HPI  The patient denies anorexia, fever, weight loss, weight gain, vision loss, decreased hearing, hoarseness, chest pain, syncope, dyspnea on exertion, peripheral edema, prolonged cough, headaches, hemoptysis, abdominal pain, and severe indigestion/heartburn.    Vital Signs:  Patient profile:   65 year old female Height:      62 inches Weight:      233.50 pounds BMI:     42.86 O2 Sat:      98 % on Room air Pulse rate:   82 / minute BP sitting:   122 / 78  (left arm) Cuff size:   large  Vitals Entered By: Reynaldo Minium CMA (August 21, 2009 9:47 AM)  O2 Flow:  Room air  Physical Exam  Additional Exam:  General: A/Ox3; pleasant and cooperative, NAD, overweight SKIN: no rash, lesions NODES: no lymphadenopathy HEENT: [place]/AT, EOM- WNL, Conjuctivae- clear, Chronic periorbital edema TM-WNL with nonocclusive cerumen on left., Nose- clear, Throat- clear and wnl,, turbinate edema again noted. Mucosa is healthy and pink., no drainage or polyps. NECK: Supple w/ fair ROM, JVD- none, normal carotid impulses  w/o bruits Thyroid- CHEST: coarse BS no wheeze, unlabored HEART: RRR, no m/g/r heard ABDOMEN: soft. XLK:GMWN, nl pulses, no edema  NEURO: Grossly intact to observation      Impression & Recommendations:  Problem # 1:  RHINOSINUSITIS, CHRONIC (ICD-473.8) Acute exacerbation of chronic sinusitis. Since she has paid for the amoxacillin, I will restart lansoprazole while she takes it in hopes that will permit completion without new purchase. She has a neti pot and is encouraged to use it now.  Problem # 2:  GERD (ICD-530.81)  She is actively aware of acid indigestion, which would make her less tolerant of antibiotic GI issues, and will justify treatment in its own right as well. Her updated medication list for this problem includes:    Lansoprazole 30 Mg Cpdr (Lansoprazole) .Marland Kitchen... 1 daily to prevent acid indigestion  Medications Added to Medication List This Visit: 1)  Lansoprazole 30 Mg Cpdr (Lansoprazole) .Marland Kitchen.. 1 daily to prevent acid indigestion  Other Orders: Est. Patient Level III (02725)  Patient Instructions: 1)  Please schedule a follow-up appointment in 3 months. 2)  OK to use th Mobic/ meloxicam as needed for muscle pain. A heating pad may help. 3)  Use your Neti pot 4)  Try using Prevacid/ lansoprazole to prevent heartburn/ acid indigestion. Also see if it makes it easier to restart your amoxacillin, so we can treat the sinusitis. 5)  lansoprazole script sent to drug store. Prescriptions: LANSOPRAZOLE 30 MG CPDR (LANSOPRAZOLE) 1 daily to prevent acid indigestion  #30 x prn   Entered and Authorized by:   Waymon Budge MD   Signed by:   Waymon Budge MD on 08/21/2009   Method used:   [facility]        [facility] [phone]* (retail)       [address]       [place], [place]  [address]       Ph: [phone] or [phone]       Fax: [phone]   RxID:   [id number]

## 2010-08-14 NOTE — Assessment & Plan Note (Signed)
Summary: ROV/APC   Visit Type:  Follow-up  Chief Complaint:  follow up.  History of Present Illness: Current Problems:  URTICARIA (ICD-708.9) OBESITY HYPOVENTILATION SYNDROME (ICD-278.8) RHINOSINUSITIS, CHRONIC (ICD-473.8) ALLERGIC RHINITIS (ICD-477.9) ASTHMA (ICD-493.90) COPD (ICD-496)  Mrs. Bonnie Ellison returns reporting that she has had a very good winter.  Her last office visit with me was in July of 2008.  She continues allergy vaccine, but because of local reactions has held with vial A at 1: 5000 and a vial B at 1:50,000.  I will discuss the dosing with the allergy lab.  She rarely needs albuterol.  She feels she needs to continue with fexofenadine saying that over-the-counter antihistamines have not worked well for her.  Now she complains that pollen is clogging her throat. She does not like nasal sprays,because the sensation in her throat is unpleasant. she would like to try again with Singulair.        Current Allergies (reviewed today): ! * SHELLFISH     Review of Systems      See HPI   Vital Signs:  Patient Profile:   65 Years Old Female Weight:      240.38 pounds O2 Sat:      95 % O2 treatment:    Room Air Pulse rate:   97 / minute BP sitting:   122 / 80  (left arm) Cuff size:   large  Vitals Entered By: Darra Lis RMA (October 16, 2007 10:23 AM)             Is Patient Diabetic? No Comments Medications reviewed with patient ..................................................................Marland KitchenDarra Lis RMA  October 16, 2007 10:30 AM      Physical Exam  General:     obese.   Eyes:     chronic periorbital edema Ears:     TMs intact and clear with normal canals Nose:     turbinate edema Mouth:     no deformity or lesions,Melampatti Class IV.   Lungs:     clear bilaterally to auscultation and percussion Heart:     regular rate and rhythm, S1, S2 without murmurs, rubs, gallops, or clicks     Problem # 1:  RHINOSINUSITIS, CHRONIC  (ICD-473.8) Doubt bacterial infection now, but CT has previously shown chronic changes. The following medications were removed from the medication list:    Nasonex 50 Mcg/act Susp (Mometasone furoate) .Marland Kitchen... As needed  Her updated medication list for this problem includes:    Fexofenadine Hcl 180 Mg Tabs (Fexofenadine hcl) ..... Once daily as needed  Orders: Est. Patient Level III (86578)   Problem # 2:  ALLERGIC RHINITIS (ICD-477.9) Seasonal exacerbation. I will let her stop the Nasonex because she actively dislikes nasal sprays. We will retry Singulair, continue fexofenadine and try Sudafed PE. The following medications were removed from the medication list:    Nasonex 50 Mcg/act Susp (Mometasone furoate) .Marland Kitchen... As needed  Her updated medication list for this problem includes:    Fexofenadine Hcl 180 Mg Tabs (Fexofenadine hcl) ..... Once daily as needed  Orders: Est. Patient Level III (46962)   Problem # 3:  ASTHMA (ICD-493.90)  Her updated medication list for this problem includes:    Albuterol 90 Mcg/act Aers (Albuterol) .Marland Kitchen... As needed    Singulair 10 Mg Tabs (Montelukast sodium) .Marland Kitchen... 1 daily  Orders: Est. Patient Level III (95284)   Problem # 4:  COPD (ICD-496)  Her updated medication list for this problem includes:    Albuterol 90 Mcg/act Aers (Albuterol) .Marland KitchenMarland KitchenMarland KitchenMarland Kitchen  As needed    Singulair 10 Mg Tabs (Montelukast sodium) .Marland Kitchen... 1 daily   Medications Added to Medication List This Visit: 1)  Proair Hfa 108 (90 Base) Mcg/act Aers (Albuterol sulfate) .... 2 puffs four times a day as needed 2)  Allergy Shots 1:50  .... Once weekly 3)  Benicar Hct 40-25 Mg Tabs (Olmesartan medoxomil-hctz) .... Take 1 tablet by mouth once a day 4)  Estradiol 1 Mg Tabs (Estradiol) .... Take 1 tablet by mouth once a day 5)  Singulair 10 Mg Tabs (Montelukast sodium) .Marland Kitchen.. 1 daily   Patient Instructions: 1)  Please schedule a follow-up appointment in 6 months. 2)  I will check on your allergy  vaccine strength with the allergy lab. 3)  OK to stop Nasonex 4)  Try sample Singulair 10 mg for one  7 day pack. if it seems to help get your printed prescription filled. 5)  Try adding Sudafed-PE, at least in the mornings- nonprescription    Prescriptions: PROAIR HFA 108 (90 BASE) MCG/ACT  AERS (ALBUTEROL SULFATE) 2 puffs four times a day as needed  #1 x 6   Entered by:   Reynaldo Minium CMA   Authorized by:   Waymon Budge MD   Signed by:   Reynaldo Minium CMA on 10/16/2007   Method used:   [facility]ron[facility] ...       [facility] [address] [phone]*       [address]       [place], [place]  [address]       Ph: [phone] or [phone]       Fax: [phone]   RxID:   [id number] SINGULAIR 10 MG  TABS (MONTELUKAST SODIUM) 1 daily  #30 x prn   Entered and Authorized by:   Waymon Budge MD   Signed by:   Waymon Budge MD on 10/16/2007   Method used:   Print then Give to Patient   RxID:   [id number]  ]

## 2010-08-14 NOTE — Progress Notes (Signed)
Summary: Zyrtec not working well  Phone Note Programmer, applications: T.Scott Details for Reason: Needs another antihistmine. Summary of Call: Mrs. Kinnaird came in today saying zyrtec which you currently have her on makes her sleepy & nervous. She is asking for suggestions as to what she can try. [facility] [phone] Initial call taken by: Dimas Millin,  March 10, 2009 4:49 PM  Follow-up for Phone Call        Please suggest she either take Zyrtec in late evening, or try otc Alavert/ loratadine. Let me know if that doesn't work. Follow-up by: Waymon Budge MD,  March 10, 2009 5:05 PM  Additional Follow-up for Phone Call Additional follow up Details #1::        called and spoke with pt and she is aware to take the zyrtec late in the evening or she can try alavert/loratadine per CY.  pt will call back to let CY know how she is doing on this. Marijo File CMA  March 10, 2009 5:10 PM

## 2010-08-14 NOTE — Assessment & Plan Note (Signed)
Summary: FLU SHOT/MHH   Allergies: 1)  ! * Shellfish   Other Orders: Flu Vaccine 65yrs + (36644) Administration Flu vaccine - MCR (I3474)     Flu Vaccine Consent Questions     Do you have a history of severe allergic reactions to this vaccine? no    Any prior history of allergic reactions to egg and/or gelatin? no    Do you have a sensitivity to the preservative Thimersol? no    Do you have a past history of Guillan-Barre Syndrome? no    Do you currently have an acute febrile illness? no    Have you ever had a severe reaction to latex? no    Vaccine information given and explained to patient? yes    Are you currently pregnant? no    Lot Number:[id number]   Exp Date:01/11/2010   Site Given  Right Deltoid IM Clarise Cruz (AAMA)  April 14, 2009 5:22 PM

## 2010-08-14 NOTE — Assessment & Plan Note (Signed)
Summary: sinus infection////kp   Chief Complaint:  c/o sinus drainage and headache.  History of Present Illness: Bonnie Ellison has hx of allergic rhinitis, mild asthma.  She continues allergy vaccine, but because of local reactions has held with vial A at 1: 5000 and a vial B at 1:50,000.   She rarely needs albuterol.    02/03/08 presents for sinus pain, pressure, nasal discharge, hard to get anything out. Denies chest pain, dyspnea, orthopnea, hemoptysis, fever, n/v/d, edema.       Updated Prior Medication List: PROAIR HFA 108 (90 BASE) MCG/ACT  AERS (ALBUTEROL SULFATE) 2 puffs four times a day as needed * ALLERGY VACCINE A 1:5000, 0.3/ B 1:50,000, 0.5 once weekly BENICAR HCT 40-25 MG  TABS (OLMESARTAN MEDOXOMIL-HCTZ) Take 1 tablet by mouth once a day ESTRADIOL 1 MG  TABS (ESTRADIOL) Take 1 tablet by mouth once a day NEXIUM 40 MG  PACK (ESOMEPRAZOLE MAGNESIUM) Take 1 tablet by mouth once a day SINGULAIR 10 MG  TABS (MONTELUKAST SODIUM) 1 daily  Current Allergies (reviewed today): ! * SHELLFISH  Past Medical History:    Reviewed history and no changes required:       URTICARIA (ICD-708.9)       OBESITY HYPOVENTILATION SYNDROME (ICD-278.8)       RHINOSINUSITIS, CHRONIC (ICD-473.8)       ALLERGIC RHINITIS (ICD-477.9)       ASTHMA (ICD-493.90)       COPD (ICD-496)           Family History:    Reviewed history and no changes required:  Social History:    Reviewed history and no changes required:   Risk Factors: Tobacco use:  never   Review of Systems      See HPI   Vital Signs:  Patient Profile:   65 Years Old Female Weight:      231 pounds O2 Sat:      98 % O2 treatment:    Room Air Temp:     97.3 degrees F oral Pulse rate:   90 / minute BP sitting:   124 / 80  (left arm) Cuff size:   regular  Vitals Entered By: Boone Master CNA (February 03, 2008 11:25 AM)             Is Patient Diabetic? No Comments Medications reviewed with patient Boone Master CNA  February 03, 2008 11:25 AM      Physical Exam  GENERAL:  A/Ox3; pleasant & cooperative.NAD HEENT:  [place]/AT, EOM-wnl, PERRLA, EACs-clear, TMs-wnl, NOSE-red swollen max sinus tenderness NECK:  Supple w/ fair ROM; no JVD; normal carotid impulses w/o bruits; no thyromegaly or nodules palpated; no lymphadenopathy. CHEST:  Clear to P & A; w/o, wheezes/ rales/ or rhonchi. HEART:  RRR, no m/r/g  heard ABDOMEN:  Soft & nt; nml bowel sounds; no organomegaly or masses detected. EXT: Warm bilat,  no calf pain, edema, clubbing, pulses intact Skin: no rash/lesion       Impression & Recommendations:  Problem # 1:  SINUSITIS, ACUTE (ICD-461.9) Augmentin 875mg  two times a day x 10 days with food Mucinex by mouth two times a day  Saline nasal 2 puffs four times a day rinse nasal passages Afrin nasal 2 puffs two times a day for 5 day Please contact office for sooner follow up if symptoms do not improve or worsen  follow up Dr. Maple Hudson as scheduled.  Her updated medication list for this problem includes:    Augmentin 875-125 Mg Tabs (Amoxicillin-pot  clavulanate) .Marland Kitchen... 1 by mouth two times a day Instructed on treatment. Call if symptoms persist or worsen.  Orders: Est. Patient Level III (04540)   Medications Added to Medication List This Visit: 1)  Allergy Vaccine A 1:5000, 0.3/ B 1:50,000, 0.5  .... Once weekly 2)  Nexium 40 Mg Pack (Esomeprazole magnesium) .... Take 1 tablet by mouth once a day 3)  Augmentin 875-125 Mg Tabs (Amoxicillin-pot clavulanate) .Marland Kitchen.. 1 by mouth two times a day  Complete Medication List: 1)  Proair Hfa 108 (90 Base) Mcg/act Aers (Albuterol sulfate) .... 2 puffs four times a day as needed 2)  Allergy Vaccine A 1:5000, 0.3/ B 1:50,000, 0.5  .... Once weekly 3)  Benicar Hct 40-25 Mg Tabs (Olmesartan medoxomil-hctz) .... Take 1 tablet by mouth once a day 4)  Estradiol 1 Mg Tabs (Estradiol) .... Take 1 tablet by mouth once a day 5)  Nexium 40 Mg Pack (Esomeprazole magnesium) .... Take  1 tablet by mouth once a day 6)  Singulair 10 Mg Tabs (Montelukast sodium) .Marland Kitchen.. 1 daily 7)  Augmentin 875-125 Mg Tabs (Amoxicillin-pot clavulanate) .Marland Kitchen.. 1 by mouth two times a day   Patient Instructions: 1)  Augmentin 875mg  two times a day x 10 days with food 2)  Mucinex by mouth two times a day  3)  Saline nasal 2 puffs four times a day rinse nasal passages 4)  Afrin nasal 2 puffs two times a day for 5 day 5)  Please contact office for sooner follow up if symptoms do not improve or worsen  6)  follow up Dr. Maple Hudson as scheduled.    Prescriptions: AUGMENTIN 875-125 MG  TABS (AMOXICILLIN-POT CLAVULANATE) 1 by mouth two times a day  #20 x 20   Entered and Authorized by:   Rubye Oaks NP   Signed by:   Eri Platten NP on 02/03/2008   Method used:   Electronically sent to ...       [facility] [address] [phone]*       [address]       [place], [place]  [address]       Ph: [phone] or [phone]       Fax: [phone]   RxID:   [id number]  ]

## 2010-08-14 NOTE — Assessment & Plan Note (Signed)
Summary: 3 months/apc   Primary Provider/Referring Provider:  Kellie Shropshire  CC:  3 month follow up visit.  History of Present Illness: 15-Dec-2008- Allergic rhinitis, asthma/ copd Treated last week for broncitis that began with itching. On pred taper she opened up and got a lot of yellow brown mucus from head and chest. Now still week and  cough stil productive, but doing very much better. CXR- bronchitis changes. Reviewed. Mainly still notices sinus pressure. Note BP -just switched from Diovan to Benicar HCT. Likes zyrtec but Singulair no help.  April 17, 2009- Allergic rhinitis, asthma/ copd Two weeks of "allergy" meaning watery eyes and nose with itching. Denies postnasal drip, sore throat, cough or wheeze. No hives in a long time. Zyrtec helps but makes her drowsy. Dropped off singulair.  August 21, 2009- Allergic rhinitis, chronic sinusitis, asthma/ copd Went to UC last week for sinusitis. Given meloxicam for associated shoulder pain- used as needed. Given amoxacillin 500 three times a day x 10 days which she stopped because of upset stomach. Feels "water flowing in back of ear". We had directed to ENT in the past but she had not wanted surgery.   December 19, 2009- Allergic rhinosinusitis, Chronic sinusitis, asthma/ COPD Says she has done very well througnh Winter and Spring.  Her allergy vaccine vial B was reduced to 0.1 ml. 1:50,000 to avoid local reactions. Occasional occipital headache but little sneezing or blowing. Zyrtec at bedtime. Chest ok- occasional  phlegm with morning cough- clearing throat. Denies purulent, blood, chest pain or palpitation and denies dyspnea. No hives in along time.  Asthma History    Asthma Control Assessment:    Age range: 12+ years    Symptoms: 0-2 days/week    Nighttime Awakenings: 0-2/month    Interferes w/ normal activity: no limitations    SABA use (not for EIB): 0-2 days/week    Asthma Control Assessment: Well Controlled   Preventive  Screening-Counseling & Management  Alcohol-Tobacco     Smoking Status: never  Current Medications (verified): 1)  Proair Hfa 108 (90 Base) Mcg/act  Aers (Albuterol Sulfate) .... 2 Puffs Four Times A Day As Needed 2)  Allergy Vaccine A 1:500(0.3);  / B 1:50,000, 0.3 Gh .... Once Weekly 3)  Zyrtec Allergy 10 Mg Tabs (Cetirizine Hcl) .... Take 1 Tablet By Mouth Once A Day 4)  Estradiol 1 Mg  Tabs (Estradiol) .... Take 1 Tablet By Mouth Once A Day 5)  Benicar Hct 40-25 Mg Tabs (Olmesartan Medoxomil-Hctz) .Marland Kitchen.. 1 Once Daily 6)  Tylenol 325 Mg Tabs (Acetaminophen) .... As Needed By Mouth 7)  Prilosec Otc 20 Mg Tbec (Omeprazole Magnesium) .... Take 1 By Mouth Once Daily  Allergies (verified): 1)  ! * Shellfish  Past History:  Past Medical History: Last updated: 02/03/2008 URTICARIA (ICD-708.9) OBESITY HYPOVENTILATION SYNDROME (ICD-278.8) RHINOSINUSITIS, CHRONIC (ICD-473.8) ALLERGIC RHINITIS (ICD-477.9) ASTHMA (ICD-493.90) COPD (ICD-496)    Past Surgical History: Last updated: Dec 15, 2008 T A H and B S O  Family History: Last updated: December 15, 2008 Mother- CHF Father- died pancreatic cancer  Social History: Last updated: 15-Dec-2008 Patient never smoked.  Married Retired Warehouse manager work  Risk Factors: Smoking Status: never (12/19/2009)  Review of Systems      See HPI       The patient complains of productive cough, headaches, and nasal congestion/difficulty breathing through nose.  The patient denies shortness of breath with activity, shortness of breath at rest, non-productive cough, coughing up blood, chest pain, irregular heartbeats, acid heartburn,  indigestion, loss of appetite, weight change, abdominal pain, difficulty swallowing, sore throat, tooth/dental problems, and sneezing.    Vital Signs:  Patient profile:   65 year old female Height:      62 inches Weight:      235 pounds BMI:     43.14 O2 Sat:      98 % on Room air Pulse rate:   79 / minute BP sitting:   128  / 68  (left arm) Cuff size:   large  Vitals Entered By: Reynaldo Minium CMA (December 19, 2009 2:34 PM)  O2 Flow:  Room air  Physical Exam  Additional Exam:  General: A/Ox3; pleasant and cooperative, NAD, overweight SKIN: no rash, lesions NODES: no lymphadenopathy HEENT: [place]/AT, EOM- WNL, Conjuctivae- clear, Chronic periorbital edema TM-WNL ., Nose- clear, Throat- clear and wnl,, turbinate edema again noted. Mucosa is healthy and pink., no drainage or polyps. NECK: Supple w/ fair ROM, JVD- none, normal carotid impulses w/o bruits Thyroid- CHEST:Clear to P&A HEART: RRR, no m/g/r heard ABDOMEN: soft. WJX:BJYN, nl pulses, no edema  NEURO: Grossly intact to observation      Impression & Recommendations:  Problem # 1:  RHINOSINUSITIS, CHRONIC (ICD-473.8) She had not wanted surgery. Currently not bothering her. She describes occipital  headaches which are not from sinus discomfort.  Problem # 2:  ALLERGIC RHINITIS (ICD-477.9)  Continues allergy vaccine but never able to build fully- remains very sensitive. Considering if she coud be a Xolair candidate. Her updated medication list for this problem includes:    Zyrtec Allergy 10 Mg Tabs (Cetirizine hcl) .Marland Kitchen... Take 1 tablet by mouth once a day  Problem # 3:  ASTHMA (ICD-493.90) We discussed meds and the utility of an Epipen- has never felt that she was in a situation to need it.  Medications Added to Medication List This Visit: 1)  Prilosec Otc 20 Mg Tbec (Omeprazole magnesium) .... Take 1 by mouth once daily 2)  Epipen 0.3 Mg/0.32ml Devi (Epinephrine) .... For severe allergic reaction  Other Orders: Est. Patient Level IV (82956)  Patient Instructions: 1)  Please schedule a follow-up appointment in 6 months. 2)  Script for Epipen in case of severe allergic reaction as discussed 3)  Refill for Proair Prescriptions: PROAIR HFA 108 (90 BASE) MCG/ACT  AERS (ALBUTEROL SULFATE) 2 puffs four times a day as needed  #1 x 6   Entered and  Authorized by:   Waymon Budge MD   Signed by:   Waymon Budge MD on 12/19/2009   Method used:   Print then Give to Patient   RxID:   [id number] EPIPEN 0.3 MG/0.3ML DEVI (EPINEPHRINE) For severe allergic reaction  #1 x prn   Entered and Authorized by:   Waymon Budge MD   Signed by:   Waymon Budge MD on 12/19/2009   Method used:   Print then Give to Patient   RxID:   [id number]

## 2010-08-14 NOTE — Assessment & Plan Note (Signed)
Summary: f/u 4 months///kp   Primary Provider/Referring Provider:  Kellie Shropshire  CC:  4 month follow up visit.  History of Present Illness: 05/31/08- Allergic rhinitis, asthma/copd, rhinosinusitis C/O headaches, odor from nose, head congestion. Ears ok. denies discharge, ear discomfort, cough or wheeze, fever or sorethroat. Zyrtec some help.  Nov 17, 2008--Presents for an acute office visit.  c/o cough-yellow x 1 day, sob, wheezing, felt like running fever last pm,right ear pain today, wheezing. seen by primary 4/22, rx amoxciillin 500mg  2 by mouth two times a day x 14 d, clarithromycin two times a day for 2 weeks.  Some improvement but still coughing and intermittent wheezing. Denies chest pain, orthopnea, hemoptysis, fever, n/v/d, edema, headache.   2008/11/28- Allergic rhinitis, asthma/ copd Treated last week for broncitis that began with itching. On pred taper she opened up and got a lot of yellow brown mucus from head and chest. Now still week and  cough stil productive, but doing very much better. CXR- bronchitis changes. Reviewed. Mainly still notices sinus pressure. Note BP -just switched from Diovan to Benicar HCT. Likes zyrtec but Singulair no help.  April 17, 2009- Allergic rhinitis, asthma/ copd Two weeks of "allergy" meaning watery eyes and nose with itching. Denies postnasal drip, sore throat, cough or wheeze. No hives in a long time. Zyrtec helps but makes her drowsy. Dropped off singulair.   Current Medications (verified): 1)  Proair Hfa 108 (90 Base) Mcg/act  Aers (Albuterol Sulfate) .... 2 Puffs Four Times A Day As Needed 2)  Allergy Vaccine A 1:500(0.3);  / B 1:50,000, 0.3 Gh .... Once Weekly 3)  Singulair 10 Mg  Tabs (Montelukast Sodium) .Marland Kitchen.. 1 Daily 4)  Zyrtec Allergy 10 Mg Tabs (Cetirizine Hcl) .... Take 1 Tablet By Mouth Once A Day 5)  Estradiol 1 Mg  Tabs (Estradiol) .... Take 1 Tablet By Mouth Once A Day 6)  Benicar Hct 40-25 Mg Tabs (Olmesartan Medoxomil-Hctz)  .Marland Kitchen.. 1 Once Daily 7)  Tylenol 325 Mg Tabs (Acetaminophen) .... As Needed By Mouth  Allergies (verified): 1)  ! * Shellfish  Past History:  Past Medical History: Last updated: 02/03/2008 URTICARIA (ICD-708.9) OBESITY HYPOVENTILATION SYNDROME (ICD-278.8) RHINOSINUSITIS, CHRONIC (ICD-473.8) ALLERGIC RHINITIS (ICD-477.9) ASTHMA (ICD-493.90) COPD (ICD-496)    Past Surgical History: Last updated: 2008/11/28 T A H and B S O  Family History: Last updated: 11/28/2008 Mother- CHF Father- died pancreatic cancer  Social History: Last updated: 11/28/08 Patient never smoked.  Married Retired Warehouse manager work  Risk Factors: Smoking Status: never (Nov 28, 2008)  Review of Systems      See HPI       The patient complains of indigestion, nasal congestion/difficulty breathing through nose, and sneezing.  The patient denies shortness of breath with activity, shortness of breath at rest, productive cough, non-productive cough, coughing up blood, chest pain, irregular heartbeats, acid heartburn, loss of appetite, weight change, abdominal pain, difficulty swallowing, sore throat, tooth/dental problems, headaches, ear ache, hand/feet swelling, joint stiffness or pain, rash, change in color of mucus, and fever.         Occasional heartburn  Vital Signs:  Patient profile:   65 year old female Height:      62 inches (157.48 cm) Weight:      236.25 pounds (107.39 kg) BMI:     43.37 O2 Sat:      97 % on Room air Pulse rate:   71 / minute BP sitting:   124 / 72  (left arm) Cuff size:  large  Vitals Entered By: Reynaldo Minium CMA (April 17, 2009 11:23 AM)  O2 Flow:  Room air  Physical Exam  Additional Exam:  General: A/Ox3; pleasant and cooperative, NAD, overweight SKIN: no rash, lesions NODES: no lymphadenopathy HEENT: [place]/AT, EOM- WNL, Conjuctivae- clear, Chronic periorbital edema TM-WNL, Nose- clear, Throat- clear and wnl,, turbinate edema again noted. Mucosa is healthy and pink., no  drainage or polyps. NECK: Supple w/ fair ROM, JVD- none, normal carotid impulses w/o bruits Thyroid- CHEST: coarse BS w/ few faint exp wheezing.  HEART: RRR, no m/g/r heard ABDOMEN: soft. ZOX:WRUE, nl pulses, no edema  NEURO: Grossly intact to observation      Impression & Recommendations:  Problem # 1:  ALLERGIC RHINITIS (ICD-477.9)  Seasonal exacerbation. She continues Zyrtec and allergy vaccine at reduced doses as tolerated. Will try sample antihistamine nasal spray. Her updated medication list for this problem includes:    Zyrtec Allergy 10 Mg Tabs (Cetirizine hcl) .Marland Kitchen... Take 1 tablet by mouth once a day  Problem # 2:  ASTHMA (ICD-493.90) Good control for now. She has had flu vax.  Other Orders: Est. Patient Level III (45409)  Patient Instructions: 1)  Please schedule a follow-up appointment in 4 mont 2)  Sample Astepro antihistamine nasal spray: 1-2 puffs each nostril, up to twice daily when needed.

## 2010-08-14 NOTE — Miscellaneous (Signed)
Summary: Injection Record / [facility]    Injection Record / [facility]    Imported By: Lennie Odor 03/16/2010 10:34:17  _____________________________________________________________________  External Attachment:    Type:   Image     Comment:   External Document

## 2010-08-14 NOTE — Progress Notes (Signed)
Summary: ALLERGY  Phone Note Other Incoming   Caller: T.Scott Details for Reason: Update Summary of Call: Bonnie Ellison was able to build to 1:500 at 0.3 (vial A) & 1:50,000 at 0.3 (vial B). Initial call taken by: Dimas Millin,  March 16, 2009 5:09 PM  Follow-up for Phone Call        Vaccine dosing updated on med list Follow-up by: Waymon Budge MD,  March 29, 2009 9:16 PM    New/Updated Medications: * ALLERGY VACCINE A 1:500(0.3);  / B 1:50,000, 0.3 GH once weekly

## 2010-08-14 NOTE — Progress Notes (Signed)
Summary: nos appt  Phone Note Call from Patient   Caller: [email or web address]  Call For: young Summary of Call: [facility]sc nos from 12/5 to 1/10. Initial call taken by: Darletta Moll,  June 19, 2010 9:26 AM

## 2010-08-14 NOTE — Assessment & Plan Note (Signed)
Summary: follow up/ mbw   PCP:  Kellie Shropshire  Chief Complaint:  follow up/problems with sinuses x 2-3 wks.  History of Present Illness: Current Problems:  SINUSITIS, ACUTE (ICD-461.9) URTICARIA (ICD-708.9) OBESITY HYPOVENTILATION SYNDROME (ICD-278.8) RHINOSINUSITIS, CHRONIC (ICD-473.8) ALLERGIC RHINITIS (ICD-477.9) ASTHMA (ICD-493.90) COPD (ICD-496)  From 02/03/08- TP Mrs. Fotheringham has hx of allergic rhinitis, mild asthma.  She continues allergy vaccine, but because of local reactions has held with vial A at 1: 5000 and a vial B at 1:50,000.   She rarely needs albuterol.    02/03/08 presents for sinus pain, pressure, nasal discharge, hard to get anything out. Denies chest pain, dyspnea, orthopnea, hemoptysis, fever, n/v/d, edema.   05/31/08- Allergic rhinitis, asthma/copd, rhinosinusitis C/O headaches, odor from nose, head congestion. Ears ok. denies discharge, ear discomfort, cough or wheeze, fever or sorethroat. Zyrtec some help.        Updated Prior Medication List: PROAIR HFA 108 (90 BASE) MCG/ACT  AERS (ALBUTEROL SULFATE) 2 puffs four times a day as needed * ALLERGY VACCINE A 1:5000, 0.3/ B 1:50,000, 0.5 once weekly BENICAR HCT 40-25 MG  TABS (OLMESARTAN MEDOXOMIL-HCTZ) Take 1 tablet by mouth once a day ESTRADIOL 1 MG  TABS (ESTRADIOL) Take 1 tablet by mouth once a day NEXIUM 40 MG  PACK (ESOMEPRAZOLE MAGNESIUM) Take 1 tablet by mouth once a day SINGULAIR 10 MG  TABS (MONTELUKAST SODIUM) 1 daily ZYRTEC ALLERGY 10 MG TABS (CETIRIZINE HCL) Take 1 tablet by mouth once a day  Current Allergies (reviewed today): ! * SHELLFISH  Past Medical History:    Reviewed history from 02/03/2008 and no changes required:       URTICARIA (ICD-708.9)       OBESITY HYPOVENTILATION SYNDROME (ICD-278.8)       RHINOSINUSITIS, CHRONIC (ICD-473.8)       ALLERGIC RHINITIS (ICD-477.9)       ASTHMA (ICD-493.90)       COPD (ICD-496)             Review of Systems      See HPI   Vital  Signs:  Patient Profile:   65 Years Old Female Weight:      233 pounds O2 Sat:      97 % O2 treatment:    Room Air Pulse rate:   89 / minute BP sitting:   118 / 80  (left arm) Cuff size:   regular  Vitals Entered By: Reynaldo Minium CMA (May 31, 2008 3:53 PM)                 Physical Exam  General: A/Ox3; pleasant and cooperative, NAD, overweight SKIN: no rash, lesions NODES: no lymphadenopathy HEENT: [place]/AT, EOM- WNL, Conjuctivae- clear, PERRLA, TM-WNL, Nose- clear, Throat- clear and wnl, periorbital edema and dark circles, turbinate edema, no drainage or polyps. NECK: Supple w/ fair ROM, JVD- none, normal carotid impulses w/o bruits Thyroid- normal to palpation CHEST: Clear to P&A, diminished, without wheeze or cough HEART: RRR, no m/g/r heard ABDOMEN: Soft and nl; nml bowel sounds; no organomegaly or masses noted EAV:WUJW, nl pulses, no edema  NEURO: Grossly intact to observation         Problem # 1:  RHINOSINUSITIS, CHRONIC (ICD-473.8) Presumptive sinusitis. if not responsive, may need CT. The following medications were removed from the medication list:    Augmentin 875-125 Mg Tabs (Amoxicillin-pot clavulanate) .Marland Kitchen... 1 by mouth two times a day  Her updated medication list for this problem includes:    Zyrtec Allergy 10 Mg Tabs (  Cetirizine hcl) .Marland Kitchen... Take 1 tablet by mouth once a day    Cefdinir 300 Mg Caps (Cefdinir) .Marland Kitchen... 2 daily x 10 days   Problem # 2:  ASTHMA (ICD-493.90) Assessment: Improved Continue allergy vaccine. Her updated medication list for this problem includes:    Proair Hfa 108 (90 Base) Mcg/act Aers (Albuterol sulfate) .Marland Kitchen... 2 puffs four times a day as needed    Singulair 10 Mg Tabs (Montelukast sodium) .Marland Kitchen... 1 daily   Medications Added to Medication List This Visit: 1)  Zyrtec Allergy 10 Mg Tabs (Cetirizine hcl) .... Take 1 tablet by mouth once a day 2)  Cefdinir 300 Mg Caps (Cefdinir) .... 2 daily x 10 days   Patient  Instructions: 1)  Please schedule a follow-up appointment in 6 months. 2)  Script for cefdinir antibiotic sent to your drug store 3)  Try Neti pot when your sinuses act up. 4)  Keep on with your allergy shots   Prescriptions: CEFDINIR 300 MG CAPS (CEFDINIR) 2 daily x 10 days  #20 x 0   Entered and Authorized by:   Waymon Budge MD   Signed by:   Waymon Budge MD on 05/31/2008   Method used:   [facility]        [facility]  [address] [phone]* (retail)       [address]       [place], [place]  [address]       Ph: [phone] or [phone]       Fax: [phone]   RxID:   [id number]  ]

## 2010-08-14 NOTE — Miscellaneous (Signed)
Summary: Injection Record/[facility]  Injection Record/[facility]   Imported By: Sherian Rein 12/05/2009 11:20:40  _____________________________________________________________________  External Attachment:    Type:   Image     Comment:   External Document

## 2010-08-14 NOTE — Assessment & Plan Note (Signed)
Summary: f/u 1 wk///kp   Primary Provider/Referring Provider:  Kellie Shropshire  CC:  1 wk followup.  Pt states that wheezing and chest tightness has resolved.  States that she still feels fatigued.  No other complaints today.Marland Kitchen  History of Present Illness: From 02/03/08- [name] has hx of allergic rhinitis, mild asthma.  She continues allergy vaccine, but because of local reactions has held with vial A at 1: 5000 and a vial B at 1:50,000.   She rarely needs albuterol.    02/03/08 presents for sinus pain, pressure, nasal discharge, hard to get anything out. Denies chest pain, dyspnea, orthopnea, hemoptysis, fever, n/v/d, edema.   05/31/08- Allergic rhinitis, asthma/copd, rhinosinusitis C/O headaches, odor from nose, head congestion. Ears ok. denies discharge, ear discomfort, cough or wheeze, fever or sorethroat. Zyrtec some help.  Nov 17, 2008--Presents for an acute office visit.  c/o cough-yellow x 1 day, sob, wheezing, felt like running fever last pm,right ear pain today, wheezing. seen by primary 4/22, rx amoxciillin 500mg  2 by mouth two times a day x 14 d, clarithromycin two times a day for 2 weeks.  Some improvement but still coughing and intermittent wheezing. Denies chest pain, orthopnea, hemoptysis, fever, n/v/d, edema, headache.   11/24/08- Allergic rhinitis, asthma/ copd Treated last week for broncitis that began with itching. On pred taper she opened up and got a lot of yellow brown mucus from head and chest. Now still week and  cough stil productive, but doing very much better. CXR- bronchitis changes. Reviewed. Mainly still notices sinus pressure. Note BP -just switched from Diovan to Benicar HCT. Likes zyrtec but Singulair no help.   Preventive Screening-Counseling & Management     Smoking Status: never  Current Medications (verified): 1)  Proair Hfa 108 (90 Base) Mcg/act  Aers (Albuterol Sulfate) .... 2 Puffs Four Times A Day As Needed 2)  Allergy Vaccine A 1:5000, 0.3/ B  1:50,000, 0.5 .... Once Weekly 3)  Singulair 10 Mg  Tabs (Montelukast Sodium) .Marland Kitchen.. 1 Daily 4)  Zyrtec Allergy 10 Mg Tabs (Cetirizine Hcl) .... Take 1 Tablet By Mouth Once A Day 5)  Estradiol 1 Mg  Tabs (Estradiol) .... Take 1 Tablet By Mouth Once A Day 6)  Benicar Hct 40-25 Mg Tabs (Olmesartan Medoxomil-Hctz) .Marland Kitchen.. 1 Once Daily 7)  Tylenol 325 Mg Tabs (Acetaminophen) .... As Needed By Mouth 8)  Tussionex Pennkinetic Er 8-10 Mg/23ml Lqcr (Chlorpheniramine-Hydrocodone) .Marland Kitchen.. 1 Tsp Two Times A Day As Needed Cough 9)  Prednisone 10 Mg Tabs (Prednisone) .... 4 Tabs For 2 Days, Then 3 Tabs For 2 Days, 2 Tabs For 2 Days, Then 1 Tab For 2 Days, Then Stop  Allergies (verified): 1)  ! * Shellfish  Past History:  Risk Factors:    Alcohol Use: N/A    >5 drinks/d w/in last 3 months: N/A    Caffeine Use: N/A    Diet: N/A    Exercise: N/A  Risk Factors:    Smoking Status: never (11/24/2008)    Packs/Day: N/A    Cigars/wk: N/A    Pipe Use/wk: N/A    Cans of tobacco/wk: N/A    Passive Smoke Exposure: N/A  Past medical, surgical, family and social histories (including risk factors) reviewed for relevance to current acute and chronic problems.  Past Medical History:    Reviewed history from 02/03/2008 and no changes required:    URTICARIA (ICD-708.9)    OBESITY HYPOVENTILATION SYNDROME (ICD-278.8)    RHINOSINUSITIS, CHRONIC (ICD-473.8)  ALLERGIC RHINITIS (ICD-477.9)    ASTHMA (ICD-493.90)    COPD (ICD-496)       Past Surgical History:    T A H and B S O  Family History:    Reviewed history and no changes required:       Mother- CHF       Father- died pancreatic cancer  Social History:    Reviewed history and no changes required:       Patient never smoked.        Married       Retired Warehouse manager work  Review of Systems      See HPI       The patient complains of dyspnea on exertion and prolonged cough.  The patient denies fever, weight loss, hoarseness, chest pain, syncope,  peripheral edema, headaches, hemoptysis, abdominal pain, and severe indigestion/heartburn.    Vital Signs:  Patient profile:   65 year old female Weight:      229.38 pounds BMI:     42.11 Pulse rate:   94 / minute BP sitting:   162 / 98  (left arm)  Vitals Entered By: Vernie Murders (Nov 24, 2008 10:22 AM)  O2 Sat on room air at rest %:  97  Physical Exam  Additional Exam:  General: A/Ox3; pleasant and cooperative, NAD, overweight SKIN: no rash, lesions NODES: no lymphadenopathy HEENT: /AT, EOM- WNL, Conjuctivae- clear, Chronic periorbital edema TM-WNL, Nose- clear, Throat- clear and wnl,, turbinate edema, no drainage or polyps. NECK: Supple w/ fair ROM, JVD- none, normal carotid impulses w/o bruits Thyroid- CHEST: coarse BS w/ few faint exp wheezing.  HEART: RRR, no m/g/r heard ABDOMEN:  ZOX:WRUE, nl pulses, no edema  NEURO: Grossly intact to observation      CXR  Procedure date:  11/17/2008  Findings:       CHEST - 2 VIEW   Comparison: Chest x-ray of 01/03/2007   Findings: No active lung disease. There is peribronchial thickening present.  The heart is mildly enlarged.  There are degenerative changes throughout the thoracic spine.   IMPRESSION: No pneumonia.  Mild peribronchial thickening.  Borderline cardiomegaly.   Read By:  Juline Patch,  M.D.     Released By:  Juline Patch,  M.D.   Impression & Recommendations:  Problem # 1:  RHINOSINUSITIS, CHRONIC (ICD-473.8) Improved but not fully resolved. We discussed extension of prednisone taper. She almost always has bags under her eyes and we have felt she has chronic sinus disease. Surgical discussions have been done in the past.   Problem # 2:  ASTHMA (ICD-493.90) Resolving bronchitis. Once this event is resolved she may do failrly well through the summer based on past experience.  Medications Added to Medication List This Visit: 1)  Benicar Hct 40-25 Mg Tabs (Olmesartan medoxomil-hctz) .Marland Kitchen.. 1 once  daily 2)  Prednisone 10 Mg Tabs (Prednisone) .Marland Kitchen.. 1 daily for 7 days.  Other Orders: Est. Patient Level III (45409)  Patient Instructions: 1)  Please schedule a follow-up appointment in 4 months. 2)  Continue prednisone 10 mg daily for an extra 7 days. 3)  handicapped parking. Prescriptions: PREDNISONE 10 MG TABS (PREDNISONE) 1 daily for 7 days.  #7 x 0   Entered and Authorized by:   Waymon Budge MD   Signed by:   Waymon Budge MD on 11/24/2008   Method used:   [facility]        [facility] [facility] [address] [phone]* (retail)  [address]       [place], [place]  [address]       Ph: [phone] or [phone]       Fax: [phone]   RxID:   [id number]    CXR  Procedure date:  11/17/2008  Findings:       CHEST - 2 VIEW   Comparison: Chest x-ray of 01/03/2007   Findings: No active lung disease. There is peribronchial thickening present.  The heart is mildly enlarged.  There are degenerative changes throughout the thoracic spine.   IMPRESSION: No pneumonia.  Mild peribronchial thickening.  Borderline cardiomegaly.   Read By:  Juline Patch,  M.D.     Released By:  Juline Patch,  M.D.

## 2010-08-14 NOTE — Assessment & Plan Note (Signed)
Summary: flu shot/mhh  Nurse Visit   Allergies: 1)  ! * Shellfish  Immunizations Administered:  Influenza Vaccine # 1:    Vaccine Type: Fluvax 3+    Site: left deltoid    Mfr: GlaxoSmithKline    Dose: 0.5 ml    Route: IM    Given by: Tammy Tanuj Mullens    Exp. Date: 01/12/2011    Lot #: [id number]    VIS given: 02/06/10 version given June 18, 2010.  Flu Vaccine Consent Questions:    Do you have a history of severe allergic reactions to this vaccine? no    Any prior history of allergic reactions to egg and/or gelatin? no    Do you have a sensitivity to the preservative Thimersol? no    Do you have a past history of Guillan-Barre Syndrome? no    Do you currently have an acute febrile illness? no    Have you ever had a severe reaction to latex? no    Vaccine information given and explained to patient? yes    Are you currently pregnant? no  Orders Added: 1)  Flu Vaccine 67yrs + [90658] 2)  Admin 1st Vaccine [14782]

## 2010-08-14 NOTE — Miscellaneous (Signed)
Summary: Injection Record / [facility]    Injection Record / [facility]    Imported By: Lennie Odor 12/21/2009 17:24:30  _____________________________________________________________________  External Attachment:    Type:   Image     Comment:   External Document

## 2010-08-14 NOTE — Miscellaneous (Signed)
Summary: CANCELLED APPT-NO [facility]  Clinical Lists Changes  Pt cancelled 08-21-07 appt with Dr. Maple Hudson; no [facility] made. ..................................................................Marland KitchenReynaldo Minium CMA  August 25, 2007 8:23 AM

## 2010-08-14 NOTE — Miscellaneous (Signed)
Summary: CANCELLED APPT-[facility]  Clinical Lists Changes  Pt cancelled appt 07-23-07  with Dr. Maple Hudson and [facility] for 08-21-07. ..................................................................Marland KitchenReynaldo Ellison CMA  July 27, 2007 12:05 PM

## 2010-08-14 NOTE — Miscellaneous (Signed)
Summary: Injection Record / [facility]    Injection Record / [facility]    Imported By: Lennie Odor 12/05/2009 15:02:49  _____________________________________________________________________  External Attachment:    Type:   Image     Comment:   External Document

## 2010-08-14 NOTE — Miscellaneous (Signed)
Summary: Injection Record / [facility]    Injection Record / [facility]    Imported By: Lennie Odor 05/22/2010 14:56:33  _____________________________________________________________________  External Attachment:    Type:   Image     Comment:   External Document

## 2010-08-14 NOTE — Assessment & Plan Note (Signed)
Summary: tightness in chest head and ear ache/   Primary Provider/Referring Provider:  Kellie Shropshire  CC:  cough-yellow x 1 day, sob, wheezing, felt like running fever last pm, and right ear pain today.  History of Present Illness: Current Problems:  SINUSITIS, ACUTE (ICD-461.9) URTICARIA (ICD-708.9) OBESITY HYPOVENTILATION SYNDROME (ICD-278.8) RHINOSINUSITIS, CHRONIC (ICD-473.8) ALLERGIC RHINITIS (ICD-477.9) ASTHMA (ICD-493.90) COPD (ICD-496)  From 02/03/08- [name] has hx of allergic rhinitis, mild asthma.  She continues allergy vaccine, but because of local reactions has held with vial A at 1: 5000 and a vial B at 1:50,000.   She rarely needs albuterol.    02/03/08 presents for sinus pain, pressure, nasal discharge, hard to get anything out. Denies chest pain, dyspnea, orthopnea, hemoptysis, fever, n/v/d, edema.   05/31/08- Allergic rhinitis, asthma/copd, rhinosinusitis C/O headaches, odor from nose, head congestion. Ears ok. denies discharge, ear discomfort, cough or wheeze, fever or sorethroat. Zyrtec some help.  Nov 17, 2008--Presents for an acute office visit.  c/o cough-yellow x 1 day, sob, wheezing, felt like running fever last pm,right ear pain today, wheezing. seen by primary 4/22, rx amoxciillin 500mg  2 by mouth two times a day x 14 d, clarithromycin two times a day for 2 weeks.  Some improvement but still coughing and intermittent wheezing. Denies chest pain, orthopnea, hemoptysis, fever, n/v/d, edema, headache.    Current Medications (verified): 1)  Proair Hfa 108 (90 Base) Mcg/act  Aers (Albuterol Sulfate) .... 2 Puffs Four Times A Day As Needed 2)  Allergy Vaccine A 1:5000, 0.3/ B 1:50,000, 0.5 .... Once Weekly 3)  Singulair 10 Mg  Tabs (Montelukast Sodium) .Marland Kitchen.. 1 Daily 4)  Zyrtec Allergy 10 Mg Tabs (Cetirizine Hcl) .... Take 1 Tablet By Mouth Once A Day 5)  Estradiol 1 Mg  Tabs (Estradiol) .... Take 1 Tablet By Mouth Once A Day 6)  Diovan Hct 80-12.5 Mg Tabs  (Valsartan-Hydrochlorothiazide) .... Take 1 Tablet By Mouth Once A Day 7)  Amoxicillin 500 Mg Caps (Amoxicillin) .... Take 2 Tablets Two Times A Day By Mouth 8)  Clarithromycin 500 Mg Tabs (Clarithromycin) .... Take 1 Tablet Every 12 Hours 9)  Lansoprazole 30 Mg Cpdr (Lansoprazole) .... Take 1 Tablet By Mouth Every 12 Hours 10)  Tylenol 325 Mg Tabs (Acetaminophen) .... As Needed By Mouth  Allergies (verified): 1)  ! * Shellfish  Past History:  Past Medical History:    URTICARIA (ICD-708.9)    OBESITY HYPOVENTILATION SYNDROME (ICD-278.8)    RHINOSINUSITIS, CHRONIC (ICD-473.8)    ALLERGIC RHINITIS (ICD-477.9)    ASTHMA (ICD-493.90)    COPD (ICD-496)      (02/03/2008)  Risk Factors:    Alcohol Use: N/A    >5 drinks/d w/in last 3 months: N/A    Caffeine Use: N/A    Diet: N/A    Exercise: N/A  Risk Factors:    Smoking Status: never (07/22/2007)    Packs/Day: N/A    Cigars/wk: N/A    Pipe Use/wk: N/A    Cans of tobacco/wk: N/A    Passive Smoke Exposure: N/A  Review of Systems      See HPI  Vital Signs:  Patient profile:   65 year old female Height:      62 inches Weight:      233.38 pounds BMI:     42.84 O2 Sat:      94 % Temp:     98.1 degrees F oral Pulse rate:   87 / minute BP sitting:   148 /  80  (left arm) Cuff size:   large  Vitals Entered By: Elray Buba RN (Nov 17, 2008 11:30 AM)  O2 Sat on room air at rest %:  94 CC: cough-yellow x 1 day, sob, wheezing, felt like running fever last pm,right ear pain today Is Patient Diabetic? No Comments Medications reviewed with patient  Elray Buba RN  Nov 17, 2008 11:35 AM  Xopenex 1.25mg  per neb. given. Pt. tolerated well.   Physical Exam  Additional Exam:  General: A/Ox3; pleasant and cooperative, NAD, overweight SKIN: no rash, lesions NODES: no lymphadenopathy HEENT: [place]/AT, EOM- WNL, Conjuctivae- clear,   TM-WNL, Nose- clear, Throat- clear and wnl,, turbinate edema, no drainage or polyps. NECK: Supple w/  fair ROM, JVD- none, normal carotid impulses w/o bruits Thyroid- normal to palpation CHEST: coarse BS w/ few faint exp wheezing.  HEART: RRR, no m/g/r heard ABDOMEN: Soft and nl; nml bowel sounds; no organomegaly or masses noted ZOX:WRUE, nl pulses, no edema  NEURO: Grossly intact to observation      Impression & Recommendations:  Problem # 1:  ASTHMA (ICD-493.90) Xray pending.  Deop medrol IM injection 120mg  IM today in office. Xopenex neb x 1 .  Exacerbation-= slow to resolve REC: finish antibiotics.  Prednisone taper over next week.  Mucinex DM two times a day as needed cough/congestion Tussionex 1 tsp two times a day as needed cough, may make sleepy Increase fluids.  Please contact office for sooner follow up if symptoms do not improve or worsen  follow up 1 week Dr. Maple Hudson or Ediberto Sens NP    Orders: Nebulizer Tx (45409) Depo- Medrol 80mg  (J1040) Depo- Medrol 40mg  (J1030) Admin of Therapeutic Inj  intramuscular or subcutaneous (81191) T-2 View CXR, Same Day (71020.5TC) Est. Patient Level IV (47829)  The following medications were removed from the medication list:    Cefdinir 300 Mg Caps (Cefdinir) .Marland Kitchen... 2 daily x 10 days Her updated medication list for this problem includes:    Amoxicillin 500 Mg Caps (Amoxicillin) .Marland Kitchen... Take 2 tablets two times a day by mouth    Clarithromycin 500 Mg Tabs (Clarithromycin) .Marland Kitchen... Take 1 tablet every 12 hours    Tussionex Pennkinetic Er 8-10 Mg/54ml Lqcr (Chlorpheniramine-hydrocodone) .Marland Kitchen... 1 tsp two times a day as needed cough  Medications Added to Medication List This Visit: 1)  Diovan Hct 80-12.5 Mg Tabs (Valsartan-hydrochlorothiazide) .... Take 1 tablet by mouth once a day 2)  Amoxicillin 500 Mg Caps (Amoxicillin) .... Take 2 tablets two times a day by mouth 3)  Clarithromycin 500 Mg Tabs (Clarithromycin) .... Take 1 tablet every 12 hours 4)  Lansoprazole 30 Mg Cpdr (Lansoprazole) .... Take 1 tablet by mouth every 12 hours 5)  Tylenol  325 Mg Tabs (Acetaminophen) .... As needed by mouth 6)  Tussionex Pennkinetic Er 8-10 Mg/26ml Lqcr (Chlorpheniramine-hydrocodone) .Marland Kitchen.. 1 tsp two times a day as needed cough 7)  Prednisone 10 Mg Tabs (Prednisone) .... 4 tabs for 2 days, then 3 tabs for 2 days, 2 tabs for 2 days, then 1 tab for 2 days, then stop  Complete Medication List: 1)  Proair Hfa 108 (90 Base) Mcg/act Aers (Albuterol sulfate) .... 2 puffs four times a day as needed 2)  Allergy Vaccine A 1:5000, 0.3/ B 1:50,000, 0.5  .... Once weekly 3)  Singulair 10 Mg Tabs (Montelukast sodium) .Marland Kitchen.. 1 daily 4)  Zyrtec Allergy 10 Mg Tabs (Cetirizine hcl) .... Take 1 tablet by mouth once a day 5)  Estradiol 1 Mg Tabs (  Estradiol) .... Take 1 tablet by mouth once a day 6)  Diovan Hct 80-12.5 Mg Tabs (Valsartan-hydrochlorothiazide) .... Take 1 tablet by mouth once a day 7)  Amoxicillin 500 Mg Caps (Amoxicillin) .... Take 2 tablets two times a day by mouth 8)  Clarithromycin 500 Mg Tabs (Clarithromycin) .... Take 1 tablet every 12 hours 9)  Lansoprazole 30 Mg Cpdr (Lansoprazole) .... Take 1 tablet by mouth every 12 hours 10)  Tylenol 325 Mg Tabs (Acetaminophen) .... As needed by mouth 11)  Tussionex Pennkinetic Er 8-10 Mg/65ml Lqcr (Chlorpheniramine-hydrocodone) .Marland Kitchen.. 1 tsp two times a day as needed cough 12)  Prednisone 10 Mg Tabs (Prednisone) .... 4 tabs for 2 days, then 3 tabs for 2 days, 2 tabs for 2 days, then 1 tab for 2 days, then stop  Patient Instructions: 1)  finish antibiotics.  2)  Prednisone taper over next week.  3)  Mucinex DM two times a day as needed cough/congestion 4)  Tussionex 1 tsp two times a day as needed cough, may make sleepy 5)  Increase fluids.  6)  Please contact office for sooner follow up if symptoms do not improve or worsen  7)  follow up 1 week Dr. Maple Hudson or Sully Manzi NP Prescriptions: PREDNISONE 10 MG TABS (PREDNISONE) 4 tabs for 2 days, then 3 tabs for 2 days, 2 tabs for 2 days, then 1 tab for 2 days, then stop   #20 x 0   Entered and Authorized by:   Rubye Oaks NP   Signed by:   Rubye Oaks NP on 11/17/2008   Method used:   [facility]        [facility] [address] [phone][facility] (retail)       [address]       [place], [place]  [address]       Ph: [phone] or [phone]       Fax: [phone]   RxID:   [id number] Bonnie Ellison ER 8-10 MG/5ML LQCR (CHLORPHENIRAMINE-HYDROCODONE) 1 tsp two times a day as needed cough  #4oz x 0   Entered and Authorized by:   Rubye Oaks NP   Signed by:   Charlestine Rookstool NP on 11/17/2008   Method used:   Print then Give to Patient   RxID:   [id number]    Medication Administration  Injection # 1:    Medication: Depo- Medrol 80mg     Diagnosis: ASTHMA (ICD-493.90)    Route: IM    Site: LUOQ gluteus    Exp Date: 03/11    Lot #: [id number]    Mfr: Teva    Given by: Elray Buba RN (Nov 17, 2008 12:09 PM)  Injection # 2:    Medication: Depo- Medrol 40mg     Diagnosis: ASTHMA (ICD-493.90)    Route: IM    Site: LUOQ gluteus    Exp Date: 03/11    Lot #: [id number]    Mfr: Teva  Orders Added: 1)  Nebulizer Tx [15176] 2)  Depo- Medrol 80mg  [J1040] 3)  Depo- Medrol 40mg  [J1030] 4)  Admin of Therapeutic Inj  intramuscular or subcutaneous [96372] 5)  T-2 View CXR, Same Day [71020.5TC] 6)  Est. Patient Level IV [16073]

## 2010-08-14 NOTE — Progress Notes (Signed)
Summary: cough  Phone Note Call from Patient   Caller: Patient Call For: young Summary of Call: pt c/o non-productive cough x 3 days. has used singulair, zyrtec and nasonex (but says her sinus are clear). no fever/ chills or other complaints[facility] [address] cell [phone] or home #  Initial call taken by: Tivis Ringer,  October 21, 2008 12:18 PM  Follow-up for Phone Call        Called spoke with pt.  Pt states she has no signs or symptoms of infection, just dry cough x 3 days.  Pt has not tried any OTC cough syrups.  Advised to try OTC Delsym as directed on bottle.  Pt is TCB if no better.  Follow-up by: Cloyde Reams RN,  October 21, 2008 12:42 PM

## 2010-08-16 NOTE — Miscellaneous (Signed)
Summary: Injection record  Injection record   Imported By: Lester Robertson 07/27/2010 11:57:22  _____________________________________________________________________  External Attachment:    Type:   Image     Comment:   External Document

## 2010-08-17 DIAGNOSIS — J301 Allergic rhinitis due to pollen: Secondary | ICD-10-CM

## 2010-08-24 DIAGNOSIS — J301 Allergic rhinitis due to pollen: Secondary | ICD-10-CM

## 2010-08-27 ENCOUNTER — Ambulatory Visit (INDEPENDENT_AMBULATORY_CARE_PROVIDER_SITE_OTHER): Payer: BC Managed Care – PPO | Admitting: Internal Medicine

## 2010-08-27 ENCOUNTER — Encounter: Payer: Self-pay | Admitting: Internal Medicine

## 2010-08-27 DIAGNOSIS — J328 Other chronic sinusitis: Secondary | ICD-10-CM

## 2010-08-27 DIAGNOSIS — J309 Allergic rhinitis, unspecified: Secondary | ICD-10-CM

## 2010-08-27 DIAGNOSIS — K219 Gastro-esophageal reflux disease without esophagitis: Secondary | ICD-10-CM

## 2010-08-27 DIAGNOSIS — J45909 Unspecified asthma, uncomplicated: Secondary | ICD-10-CM

## 2010-08-31 ENCOUNTER — Telehealth (INDEPENDENT_AMBULATORY_CARE_PROVIDER_SITE_OTHER): Payer: Self-pay | Admitting: *Deleted

## 2010-09-05 NOTE — Assessment & Plan Note (Signed)
Summary: rov/jd   Primary Provider/Referring Provider:  Kellie Shropshire  CC:  Follow up. Pt states breathing is doing well overall.  Prod cough with clear mucus.  Denies SOB, wheezing, and chest tightness.  scratchy thraoat and sinus pressure - onset last night..  History of Present Illness: August 21, 2009- Allergic rhinitis, chronic sinusitis, asthma/ copd Went to UC last week for sinusitis. Given meloxicam for associated shoulder pain- used as needed. Given amoxacillin 500 three times a day x 10 days which she stopped because of upset stomach. Feels "water flowing in back of ear". We had directed to ENT in the past but she had not wanted surgery.   December 19, 2009- Allergic rhinosinusitis, Chronic sinusitis, asthma/ COPD Says she has done very well througnh Winter and Spring.  Her allergy vaccine vial B was reduced to 0.1 ml. 1:50,000 to avoid local reactions. Occasional occipital headache but little sneezing or blowing. Zyrtec at bedtime. Chest ok- occasional  phlegm with morning cough- clearing throat. Denies purulent, blood, chest pain or palpitation and denies dyspnea. No hives in along time.  August 27, 2010- Allergic rhinosinusitis, Chronic sinusitis, asthma/ COPD Nurse-CC: Follow up. Pt states breathing is doing well overall.  Prod cough with clear mucus.  Denies SOB, wheezing, chest tightness.  scratchy throat and sinus pressure - onset last night. Allergy vaccine- Dropped vaccine back to 1:50,000 which she tolerates w/o local reaction.  Has needed Proair more, but less than once/ week and usually then not more than once daily. Not using a maintenance inhaler. Wakes at night clear liquid in throat- denies heart burn. Irregular w/ Prilosec.      Preventive Screening-Counseling & Management  Alcohol-Tobacco     Smoking Status: never  Current Medications (verified): 1)  Proair Hfa 108 (90 Base) Mcg/act  Aers (Albuterol Sulfate) .... 2 Puffs Four Times A Day As Needed 2)   Allergy Vaccine A 1:500(0.3);  / B 1:50,000, 0.3 Gh .... Once Weekly 3)  Zyrtec Allergy 10 Mg Tabs (Cetirizine Hcl) .... Take 1 Tablet By Mouth Once A Day As Needed 4)  Estradiol 1 Mg  Tabs (Estradiol) .... Take 1 Tablet By Mouth Once A Day 5)  Benicar Hct 40-25 Mg Tabs (Olmesartan Medoxomil-Hctz) .Marland Kitchen.. 1 Once Daily 6)  Tylenol 325 Mg Tabs (Acetaminophen) .... As Needed By Mouth 7)  Prilosec Otc 20 Mg Tbec (Omeprazole Magnesium) .... Take 1 By Mouth Once Daily 8)  Epipen 0.3 Mg/0.75ml Devi (Epinephrine) .... For Severe Allergic Reaction  Allergies (verified): 1)  ! * Shellfish  Past History:  Past Surgical History: Last updated: 2008/12/24 T A H and B S O  Family History: Last updated: 12-24-2008 Mother- CHF Father- died pancreatic cancer  Social History: Last updated: 2008-12-24 Patient never smoked.  Married Retired Warehouse manager work  Risk Factors: Smoking Status: never (08/27/2010)  Past Medical History: URTICARIA (ICD-708.9) OBESITY HYPOVENTILATION SYNDROME (ICD-278.8) RHINOSINUSITIS, CHRONIC (ICD-473.8) ALLERGIC RHINITIS (ICD-477.9) ASTHMA (ICD-493.90) COPD (ICD-496) GERD    Review of Systems      See HPI       The patient complains of shortness of breath with activity and productive cough.  The patient denies shortness of breath at rest, non-productive cough, coughing up blood, chest pain, irregular heartbeats, acid heartburn, indigestion, loss of appetite, weight change, abdominal pain, difficulty swallowing, sore throat, tooth/dental problems, headaches, nasal congestion/difficulty breathing through nose, and sneezing.    Vital Signs:  Patient profile:   65 year old female Height:  63 inches Weight:      228.13 pounds BMI:     40.56 O2 Sat:      97 % on Room air Pulse rate:   91 / minute BP sitting:   122 / 74  (right arm) Cuff size:   regular  Vitals Entered By: Gweneth Dimitri RN (August 27, 2010 10:25 AM)  O2 Flow:  Room air CC: Follow up. Pt  states breathing is doing well overall.  Prod cough with clear mucus.  Denies SOB, wheezing, chest tightness.  scratchy thraoat and sinus pressure - onset last night. Comments Medications reviewed with patient Daytime contact number verified with patient. Gweneth Dimitri RN  August 27, 2010 10:25 AM    Physical Exam  Additional Exam:  General: A/Ox3; pleasant and cooperative, NAD, overweight SKIN: no rash, lesions NODES: no lymphadenopathy HEENT: [place]/AT, EOM- WNL, Conjuctivae- clear, Chronic periorbital edema TM-WNL ., Nose- clear, Throat- clear and wnl,, turbinate edema again noted. Mucosa is healthy and pink., no drainage or polyps. NECK: Supple w/ fair ROM, JVD- none, normal carotid impulses w/o bruits Thyroid- CHEST:Clear to P&A. Dry cough HEART: RRR, no m/g/r heard ABDOMEN: soft. UYQ:IHKV, nl pulses, no edema  NEURO: Grossly intact to observation      Impression & Recommendations:  Problem # 1:  RHINOSINUSITIS, CHRONIC (ICD-473.8) She describes itching in throat and drainage. At least some of this is chronic sinutistis and allergy .We continue to emphasize saline rinse, antihistamines, nasal steroid.   Problem # 2:  ASTHMA (ICD-493.90) Fairly good control with infrequent use of her rescue inhaler. She isn't reaching threshold for use of a maintenance drug now.   Problem # 3:  GERD (ICD-530.81)  I cautioned and educated on reflux vs postnasal drip. We discussed choice of antihistamines and she will try change to Allegra/.  Her updated medication list for this problem includes:    Prilosec Otc 20 Mg Tbec (Omeprazole magnesium) .Marland Kitchen... Take 1 by mouth once daily  Medications Added to Medication List This Visit: 1)  Zyrtec Allergy 10 Mg Tabs (Cetirizine hcl) .... Take 1 tablet by mouth once a day as needed 2)  Fexofenadine Hcl 180 Mg Tabs (Fexofenadine hcl) .Marland Kitchen.. 1 daily as needed for allergy  Other Orders: Est. Patient Level III (42595)  Patient Instructions: 1)  Please  schedule a follow-up appointment in 4 months. 2)  Try using Allegra 180/ fexofenadine 180 instead of Zyrtec as an antihistamine.  3)  Continue allergy vaccine. I can discuss your dosing with the allergy Lab.

## 2010-09-05 NOTE — Miscellaneous (Signed)
Summary: Injection Financial risk analyst   Imported By: Sherian Rein 08/29/2010 14:59:13  _____________________________________________________________________  External Attachment:    Type:   Image     Comment:   External Document

## 2010-09-05 NOTE — Progress Notes (Signed)
Summary: prescription for cough and chest congestion  Phone Note Call from Patient Call back at Home Phone [phone]   Caller: Patient Call For: YOUNG Summary of Call: Patient phoned wanted to know [facility] [address] [facility] and [facility]. She is coughing up a yellowish phlegm. Patient can be reached at [phone] Initial call taken by: Vedia Coffer,  August 31, 2010 11:22 AM  Follow-up for Phone Call        called and spoke with pt  and she is requesting cough meds and abx to clear this chest congestion with yellow sputum up and help with her sore chest----pt denied any fever/chills.  please advise. Randell Loop [facility]  August 31, 2010 12:55 PM   allergies:   shellfish  Additional Follow-up for Phone Call Additional follow up Details #1::        rx sent to pharmacy ok per Dr Maple Hudson.  Left msg for pt .Marland KitchenKandice Hams [facility]  August 31, 2010 2:46 PM  Additional Follow-up by: Kandice Hams CMA,  August 31, 2010 2:46 PM    New/Updated Medications: HYDROMET 5-1.5 MG/5ML SYRP (HYDROCODONE-HOMATROPINE) 1 tsp every 6 hours as needed for cough BIAXIN 500 MG TABS (CLARITHROMYCIN) 1 by mouth two times a day after meals Prescriptions: BIAXIN 500 MG TABS (CLARITHROMYCIN) 1 by mouth two times a day after meals  #14 x 0   Entered by:   Kandice Hams CMA   Authorized by:   Waymon Budge MD   Signed by:   Kandice Hams CMA on 08/31/2010   Method used:   [facility]ed to ...       [facility] [address] [phone]* (retail)       [address]       [place], [place]  [address]       Ph: [phone] or [phone]       Fax: [phone]   RxID:   [id number] HYDROMET 5-1.5 MG/5ML SYRP (HYDROCODONE-HOMATROPINE) 1 tsp every 6 hours as needed for cough  #227ml x 0   Entered by:   Kandice Hams CMA   Authorized by:   Waymon Budge MD   Signed by:   Kandice Hams CMA on 08/31/2010   Method used:   Telephoned to ...       [facility] [address] [phone]* (retail)       [address]       [place], [place]  [address]       Ph: [phone] or [phone]       Fax: [phone]   RxID:   [id number]

## 2010-09-07 ENCOUNTER — Ambulatory Visit (INDEPENDENT_AMBULATORY_CARE_PROVIDER_SITE_OTHER): Payer: BC Managed Care – PPO

## 2010-09-07 DIAGNOSIS — J301 Allergic rhinitis due to pollen: Secondary | ICD-10-CM

## 2010-09-14 ENCOUNTER — Encounter: Payer: Self-pay | Admitting: Internal Medicine

## 2010-09-14 ENCOUNTER — Ambulatory Visit (INDEPENDENT_AMBULATORY_CARE_PROVIDER_SITE_OTHER): Payer: BC Managed Care – PPO

## 2010-09-14 DIAGNOSIS — J301 Allergic rhinitis due to pollen: Secondary | ICD-10-CM

## 2010-09-14 DIAGNOSIS — J302 Other seasonal allergic rhinitis: Secondary | ICD-10-CM | POA: Insufficient documentation

## 2010-09-14 DIAGNOSIS — J3089 Other allergic rhinitis: Secondary | ICD-10-CM | POA: Insufficient documentation

## 2010-09-20 NOTE — Assessment & Plan Note (Signed)
Summary: allergy/cb  Nurse Visit   Allergies: 1)  ! * Shellfish  Orders Added: 1)  Allergy Injection (1) [04540]

## 2010-09-21 ENCOUNTER — Ambulatory Visit (INDEPENDENT_AMBULATORY_CARE_PROVIDER_SITE_OTHER): Payer: BC Managed Care – PPO

## 2010-09-21 ENCOUNTER — Encounter: Payer: Self-pay | Admitting: Internal Medicine

## 2010-09-21 DIAGNOSIS — J301 Allergic rhinitis due to pollen: Secondary | ICD-10-CM

## 2010-09-25 NOTE — Assessment & Plan Note (Signed)
Summary: allergy/cb  Nurse Visit   Allergies: 1)  ! * Shellfish  Orders Added: 1)  Allergy Injection (1) [95115] 

## 2010-09-28 ENCOUNTER — Ambulatory Visit (INDEPENDENT_AMBULATORY_CARE_PROVIDER_SITE_OTHER): Payer: BC Managed Care – PPO

## 2010-09-28 ENCOUNTER — Encounter: Payer: Self-pay | Admitting: Internal Medicine

## 2010-09-28 DIAGNOSIS — J301 Allergic rhinitis due to pollen: Secondary | ICD-10-CM

## 2010-10-02 NOTE — Assessment & Plan Note (Signed)
Summary: allergy/cb  Nurse Visit   Allergies: 1)  ! * Shellfish  Orders Added: 1)  Allergy Injection (1) [95115] 

## 2010-10-05 ENCOUNTER — Ambulatory Visit (INDEPENDENT_AMBULATORY_CARE_PROVIDER_SITE_OTHER): Payer: BC Managed Care – PPO

## 2010-10-05 DIAGNOSIS — J301 Allergic rhinitis due to pollen: Secondary | ICD-10-CM

## 2010-10-10 ENCOUNTER — Ambulatory Visit (INDEPENDENT_AMBULATORY_CARE_PROVIDER_SITE_OTHER): Payer: BC Managed Care – PPO

## 2010-10-10 DIAGNOSIS — J301 Allergic rhinitis due to pollen: Secondary | ICD-10-CM

## 2010-10-17 ENCOUNTER — Ambulatory Visit (INDEPENDENT_AMBULATORY_CARE_PROVIDER_SITE_OTHER): Payer: BC Managed Care – PPO

## 2010-10-17 DIAGNOSIS — J301 Allergic rhinitis due to pollen: Secondary | ICD-10-CM

## 2010-10-29 ENCOUNTER — Ambulatory Visit (INDEPENDENT_AMBULATORY_CARE_PROVIDER_SITE_OTHER): Payer: BC Managed Care – PPO

## 2010-10-29 DIAGNOSIS — J309 Allergic rhinitis, unspecified: Secondary | ICD-10-CM

## 2010-11-09 ENCOUNTER — Ambulatory Visit (INDEPENDENT_AMBULATORY_CARE_PROVIDER_SITE_OTHER): Payer: BC Managed Care – PPO

## 2010-11-09 DIAGNOSIS — J309 Allergic rhinitis, unspecified: Secondary | ICD-10-CM

## 2010-11-16 ENCOUNTER — Ambulatory Visit (INDEPENDENT_AMBULATORY_CARE_PROVIDER_SITE_OTHER): Payer: BC Managed Care – PPO

## 2010-11-16 DIAGNOSIS — J309 Allergic rhinitis, unspecified: Secondary | ICD-10-CM

## 2010-11-23 ENCOUNTER — Ambulatory Visit (INDEPENDENT_AMBULATORY_CARE_PROVIDER_SITE_OTHER): Payer: BC Managed Care – PPO

## 2010-11-23 DIAGNOSIS — J309 Allergic rhinitis, unspecified: Secondary | ICD-10-CM

## 2010-11-27 NOTE — Assessment & Plan Note (Signed)
 HEALTHCARE                             PULMONARY OFFICE NOTE   Bonnie Ellison, Bonnie Ellison                    MRN:          [id number]  DATE:01/30/2007                            DOB:          [birth year]    PROBLEMS:  1. Asthma/chronic obstructive pulmonary disease.  2. Allergic rhinitis.  3. Chronic rhinosinusitis.  4. Obesity hypoventilation syndrome.  5. Urticaria.   HISTORY:  She feels that she is doing fairly well now, but had to go to  the emergency room for a flare of asthma and chest congestion a few  weeks ago. She was discharged from there on prednisone. They did a chest  x-ray. She said that she got sick about midnight. Allegra D works well  for her, but it is too expensive, and she is out of albuterol. I have  talked with her again about medication compliance. I think that she  tends to wait and hope that she will be okay rather than sticking with  maintenance medications.   MEDICATIONS:  1. Allergy vaccine continues at highest tolerated dose.  2. Benicar HCT.  3. Estradiol.  4. Albuterol rescue inhaler.  5. Nasonex.   ALLERGIES:  Drug intolerant to no medicines, but SHELLFISH allergy is  present.   OBJECTIVE:  Weight 236 pounds, blood pressure 118/80, pulse 86, room air  saturation 99%. She is less dark under the eyes and less edematous under  the eyes than I have seen her at times. Nasal congestion is not really  significant now. There is no post-nasal drainage or adenopathy. Heart  sounds are regular without murmur. Lung fields sound clear.   IMPRESSION:  Chronic allergic rhinitis, asthma/chronic obstructive  pulmonary disease, history of urticaria needing to avoid ACE inhibitors.   PLAN:  1. Try Loratadine as available over-the-counter.  2. We refilled albuterol as a rescue inhaler and added Asmanex 1 puff      b.i.d. with instructions.  3. Schedule return 6 months, earlier p.r.n.     Clinton D. Maple Hudson, MD, Tonny Bollman, FACP  Electronically Signed    CDY/MedQ  DD: 02/07/2007  DT: 02/09/2007  Job #: [id number]   cc:   Merlene Laughter. Renae Gloss, M.D.

## 2010-11-30 NOTE — Assessment & Plan Note (Signed)
[facility]                               PULMONARY OFFICE NOTE   Bonnie Ellison, Bonnie Ellison                    MRN:          [id number]  DATE:05/22/2006                            DOB:          08-02-1945    PROBLEM:  1. Asthma/chronic obstructive pulmonary disease.  2. Allergic rhinitis.  3. Chronic rhinosinusitis.  4. Obesity-hyperventilation syndrome.  5. Urticaria.   HISTORY:  For the past week, she has had some soreness or ache at the right  side of the neck with frontal headache, ears ringing but no sore throat,  fever or nodes.  She has noted a little rash on her ear and felt mildly  unsteady or queasy.  Chest has felt okay.  There has been no sneezing,  obvious drainage, purulent discharge or adenopathy.  She continues allergy  vaccine here, with Vial A at 1:50, Vial B at 1:50,000.  Higher doses cause  itching but she has tolerated this well.   MEDICATIONS:  1. Allergy vaccine.  2. Allegra 180-D 24 hour.  3. Benicar HCT.  4. Estradiol.  5. Aciphex.  6. Mobic.  7. Nasonex.  8. Albuterol HFA rescue inhaler.   ALLERGIES:  No drug intolerance but allergic to SHELLFISH.   OBJECTIVE:  Weight 226 pounds, blood pressure 116/90, pulse regular 84, room  air saturation 96%.  There are a couple of areas of a little bit of  thickening on the right earlobe without ulceration or vesicle, otherwise I  do not see anything on the skin and I would not notice this if she had not  pointed it out.  Canal seems clear.  Her chronic periorbital edema does not  look as bad as it has at times.  There is some nasal mucosa edema, no  conjunctival injection and no obvious postnasal drainage.  Her chest is  quiet and unlabored.  Heart sounds are regular and normal.   IMPRESSION:  1. Chronic allergic rhinitis and rhinosinusitis.  2. Stable asthma with COPD.  3. Right head and neck pain and possible rash, uncertain etiology.  It      really does not look like  shingles at this point, although we discussed      the possibility.  I suggest that she treat it as musculoskeletal pain,      watching for clues that would suggest a sinusitis, shingles, or      something else developing.   PLAN:  1. Flu vaccine.  2. Nasal nebulizer with Depo-Medrol 80 mg IM.  3. Nasonex refilled.  4. Augmentin 875 mg b.i.d. for 7 days to hold in case symptoms of a more      obvious sinus infection develop.  5. Schedule return 6 months but earlier p.r.n.     Clinton D. Maple Hudson, MD, Tonny Bollman, FACP  Electronically Signed    CDY/MedQ  DD: 05/25/2006  DT: 05/25/2006  Job #: [id number]   cc:   Merlene Laughter. Renae Gloss, M.D.

## 2010-11-30 NOTE — Assessment & Plan Note (Signed)
[facility]                             PULMONARY OFFICE NOTE   Bonnie Ellison, Bonnie Ellison                    MRN:          [id number]  DATE:07/25/2006                            DOB:          [birth year]    PULMONARY/ALLERGY FOLLOWUP   PROBLEM:  1. Asthma/chronic obstructive pulmonary disease.  2. Allergic rhinitis.  3. Chronic rhinosinusitis.  4. Obesity hypoventilation syndrome.  5. Urticaria.   HISTORY:  She is currently doing very well.  She had a cough and  remembers the cough syrup helped with this with a cold in November, but  that has completely resolved.  Dr. Terri Piedra has given her a steroid cream  for eczema on her right ear.  She has some sense of pressure inside the  right ear without difficulty hearing.  She is not having sinus pressure,  headache, cough, or wheeze.   MEDICATIONS:  1. Allergy vaccine continues.  2. Allegra 180 mg - D every 24 hours.  3. Benicar hydrochlorothiazide.  4. Estradiol.  5. AcipHex.  6. P.r.n. use of Mobic, Nasonex, and an albuterol inhaler.   No medication intolerance, but allergic to SHELLFISH.   OBJECTIVE:  Weight 228 pounds, BP 130/84, pulse regular 67, room air  saturation 97%.  She is obese.  She has chronic periorbital edema bilaterally, but it is  less pronounced than usual.  LUNGS:  Sound clear.  There is no post-nasal drainage.  HEART:  Sounds are regular without murmur.  No adenopathy.   IMPRESSION:  1. Chronic allergic rhinitis and rhinosinusitis.  2. Asthma with chronic obstructive pulmonary disease component is      stable (never smoked).  3. Eczema.  4. New problem with eustachian dysfunction.   PLAN:  She will continue allergy vaccine and her routine meds.  We  discussed regular use of Allegra 180-D.  Sample Nasonex to use up at 2  sprays each nostril daily.  AB Otic ear drops 2 in affected ear b.i.d.  p.r.n.     Clinton D. Maple Hudson, MD, Tonny Bollman, FACP  Electronically Signed    CDY/MedQ  DD: 07/27/2006  DT: 07/27/2006  Job #: [id number]   cc:   Merlene Laughter. Renae Gloss, M.D.

## 2010-11-30 NOTE — Op Note (Signed)
[place]  Patient:    Bonnie Ellison, Bonnie Ellison                    MRN: [id number] Proc. Date: 12/03/00 Adm. Date:  [id number] Disc. Date: [id number] Attending:  Ernesto Rutherford                           Operative Report  PREOPERATIVE DIAGNOSIS:  Macular hole, stage 3 - OD.  POSTOPERATIVE DIAGNOSIS:  Macular hole, stage 3 - OD.  PROCEDURES: 1. Posterior vitrectomy, membrane peel - internal limiting membrane using    indocyanine green (ICG) guidance. 2. Injection of vitreous substitute - SF6 20% - OD.  SURGEON:  Ernesto Rutherford, M.D.  ANESTHESIA:  General endotracheal anesthesia.  INDICATION FOR PROCEDURE:  The patient is a 65 year old woman with significant vision loss hampering her activities of daily living on the basis of idiopathic stage 3 macular hole.  This is an attempt to close the macular hole to allow for the body healing processes to allow for visual acuity improvement.  Patient understands the risks of anesthesia, including the rare occurrence of death, loss of the eye, including  hemorrhage, infection, scarring, need for further surgery, no change in vision, loss of vision, and progression of disease despite intervention.  Appropriate signed consent was obtained.  DESCRIPTION OF PROCEDURE:  The patient was taken to the operating room.  In the operating room, general endotracheal anesthesia was administered without difficulty.  The right periocular region was sterilely prepped and draped in usual ophthalmic fashion.  Lid speculum applied.  Conjunctival peritomies fashioned temporally and superonasally.  A 4 mm infusion was secured 3.5 mm posterior to the limbus in the inferotemporal quadrant.  Placement in the vitreous cavity verified visually.  Superior sclerotomies then fashioned. Wild microscope placed in position with Biom attachment.  Core vitrectomy was then begun.  Iatrogenic posterior hyaloid removal was carried out with  active suction ______ to the optic nerve.  Vitreous skirt was then elevated and trimmed 360 degrees.  Fluid/air exchange was then completed.  Diluted ICG was then injected over the posterior pole in a limited fashion and then removed immediately.  Excellent ______ of the ICG was then disclosed and now an air/fluid exchange then completed.  Under fluid, the ICG was engaged with a Rice pick and elevated in a circumferential tear fashion with DORC forceps. Excellent removal of the membrane was then carried out.  Edges of the hole were freed and freely mobile.  Fluid/air exchange was then completed and an air/SF6 20% exchange was then completed.  Superior sclerotomies were then closed with 7-0 Vicryl suture.  The infusion removed and similarly closed with 7-0 Vicryl suture.  Intraocular pressure was assessed and found to be adequate.  Conjunctiva then closed with 7-0 Vicryl suture.  Subconjunctival injection of antibiotics were applied.  The patient tolerated the procedure well without complication. DD:  01/07/01 TD:  01/07/01 Job: [id number]

## 2010-12-05 ENCOUNTER — Ambulatory Visit (INDEPENDENT_AMBULATORY_CARE_PROVIDER_SITE_OTHER): Payer: BC Managed Care – PPO

## 2010-12-05 DIAGNOSIS — J309 Allergic rhinitis, unspecified: Secondary | ICD-10-CM

## 2010-12-11 ENCOUNTER — Telehealth: Payer: Self-pay | Admitting: Internal Medicine

## 2010-12-11 MED ORDER — PREDNISONE 10 MG PO TABS: ORAL_TABLET | ORAL | Status: DC

## 2010-12-11 MED ORDER — MONTELUKAST SODIUM 10 MG PO TABS: 10.0000 mg | ORAL_TABLET | Freq: Every day | ORAL | Status: DC

## 2010-12-11 NOTE — Telephone Encounter (Signed)
Spoke with pt.  She states that she has been coughing and wheezing since last night- cough was prod with clear sputum. Albuterol helps, but she is needing to use this every 4 hours. Also c/o increased SOB with exertion. No fever or other c/o's. She states would be willing to see TP if CDY wants her to. She also asks if she should start her singulair back.  Pls advise thanks! Allergies not on file

## 2010-12-11 NOTE — Telephone Encounter (Signed)
Per CY-have pt restart Singulair 10mg  1 daily and Prednisone 10mg  #20 take 4 x 2 days, 3 x 2 days, 2 x 2 days, 1 x 2 days no refills. Pt has appt with CY 12-12-10 at 945am.    [facility]. Pt aware of Rx's sent.

## 2010-12-12 ENCOUNTER — Ambulatory Visit (INDEPENDENT_AMBULATORY_CARE_PROVIDER_SITE_OTHER): Payer: BC Managed Care – PPO | Admitting: Internal Medicine

## 2010-12-12 ENCOUNTER — Encounter: Payer: Self-pay | Admitting: Internal Medicine

## 2010-12-12 DIAGNOSIS — J449 Chronic obstructive pulmonary disease, unspecified: Secondary | ICD-10-CM

## 2010-12-12 DIAGNOSIS — J301 Allergic rhinitis due to pollen: Secondary | ICD-10-CM

## 2010-12-12 MED ORDER — METHYLPREDNISOLONE ACETATE 80 MG/ML IJ SUSP
80.0000 mg | Freq: Once | INTRAMUSCULAR | Status: AC
  Administered 2010-12-12: 80 mg via INTRAMUSCULAR

## 2010-12-12 MED ORDER — PHENYLEPHRINE HCL 1 % NA SOLN
3.0000 [drp] | Freq: Once | NASAL | Status: AC
  Administered 2010-12-12: 3 [drp] via NASAL

## 2010-12-12 NOTE — Patient Instructions (Signed)
Nasal neb neo  Depo 80         For dx allergic rhinitis   Take your prednisone taper as directed  Call for antibiotics if needed.

## 2010-12-12 NOTE — Progress Notes (Signed)
  Subjective:    Patient ID: Bonnie Ellison, female    DOB: 08/15/1945, 65 y.o.   MRN: [id number]  HPI 11/15/10- 54 yo with chronic sinusitis, rhinitis, chronic obstructive asthma, obesity hypoventilation Last here Aug 27, 2010- note reviewed. Acute visit- starting 2-3 days ago has been wheezing and blowing nasal congestion. She had been outdoors at park, then gone where shellfish were being cooked outdoors. She added back Singulair yesterday and will start prednisone taper we called in yesterday afternoon. Today a little better, throat itches, head still stopped up, eyes itch.   Review of Systems Constitutional:   No weight loss, night sweats,  Fevers, chills, fatigue, lassitude. HEENT:   No headaches,  Difficulty swallowing,  Tooth/dental problems,  Sore throat,               CV:  No chest pain,  Orthopnea, PND, swelling in lower extremities, anasarca, dizziness, palpitations  GI  No heartburn, indigestion, abdominal pain, nausea, vomiting, diarrhea, change in bowel habits, loss of appetite  Resp: No shortness of breath with exertion or at rest.  No excess mucus, no productive cough,  No non-productive cough,  No coughing up of blood.  No change in color of mucus.  No wheezing today.    Skin: no rash or lesions.  GU: no dysuria, change in color of urine, no urgency or frequency.  No flank pain.  MS:  No joint pain or swelling.  No decreased range of motion.  No back pain.  Psych:  No change in mood or affect. No depression or anxiety.  No memory loss.      Objective:   Physical Exam General- Alert, Oriented, Affect-appropriate, Distress- none acute  Obese, rubbing her nose  Skin- rash-none, lesions- none, excoriation- none  Lymphadenopathy- none  Head- atraumatic  Eyes- Gross vision intact, PERRLA, conjunctivae clear secretions   Chronic periorbital edema/ bags  Ears- Hearing, canals- normal Nose- Clear, No-  Septal dev, mucus, polyps, erosion, perforation   Mucosa not  especialy pale  Throat- Mallampati II , mucosa clear , drainage- none, tonsils- atrophic  Neck- flexible , trachea midline, no stridor , thyroid nl, carotid no bruit  Chest - symmetrical excursion , unlabored     Heart/CV- RRR , no murmur , no gallop  , no rub, nl s1 s2                     - JVD- none , edema- none, stasis changes- none, varices- none     Lung- clear to P&A, wheeze- none, cough- none , dullness-none, rub- none     Chest wall-  Abd- tender-no, distended-no, bowel sounds-present, HSM- no  Br/ Gen/ Rectal- Not done, not indicated  Extrem- cyanosis- none, clubbing, none, atrophy- none, strength- nl  Neuro- grossly intact to observation         Assessment & Plan:

## 2010-12-12 NOTE — Assessment & Plan Note (Addendum)
Doubt bacterial infection, though that may develop. We will give neb and depo, then have her take the prednisone taper and we will call in an antibiotic later if needed.

## 2010-12-12 NOTE — Assessment & Plan Note (Signed)
Singulair may be sufficient for now.

## 2010-12-14 ENCOUNTER — Ambulatory Visit (INDEPENDENT_AMBULATORY_CARE_PROVIDER_SITE_OTHER): Payer: BC Managed Care – PPO

## 2010-12-14 ENCOUNTER — Encounter: Payer: Self-pay | Admitting: Internal Medicine

## 2010-12-14 DIAGNOSIS — J309 Allergic rhinitis, unspecified: Secondary | ICD-10-CM

## 2010-12-15 ENCOUNTER — Encounter: Payer: Self-pay | Admitting: Internal Medicine

## 2010-12-17 ENCOUNTER — Telehealth: Payer: Self-pay | Admitting: Internal Medicine

## 2010-12-17 NOTE — Telephone Encounter (Signed)
Spoke with the patient and she states she has been having trouble sleeping and has been feeling very jittery since taking prednisone. She states she took 2 tabs yesterday and is set to take 2 today and then 1 tab x 2 days then stop. The pt states she feels completely better from OV. She states cough and wheezing and congestion is gone. Pt wants to know instead of taking 2 tabs today could she take 1/2 tab x nest 2 days then stop or can she just stop prednisone all together?  Pt also states at last OV CY told her he would give her a list of names of OTC eye drops that she can use for itchy eyes and she left without this list. Please advise. Carron Curie, CMA Allergies  Allergen Reactions  . Shellfish Allergy     throat swelling, hives

## 2010-12-17 NOTE — Telephone Encounter (Signed)
Patient phoned stated she is returning a call to triage she can be reached at [phone].Bonnie Ellison

## 2010-12-17 NOTE — Telephone Encounter (Signed)
Per CY-take 1/2 tablet x 2 days then stop.

## 2010-12-17 NOTE — Telephone Encounter (Signed)
Pls advise on the OTC eye drops pt can use for itchy eyes.  Thanks!

## 2010-12-17 NOTE — Telephone Encounter (Signed)
[email or web address]. Jennifer Castillo, CMA   

## 2010-12-19 NOTE — Telephone Encounter (Signed)
Called, spoke with pt.  She is aware to take prednisone 1/2 talbet x 2 days then stop and try visine ac.  She verbalized understanding and wcb if anything further is needed.

## 2010-12-19 NOTE — Telephone Encounter (Signed)
Visine AC

## 2010-12-21 ENCOUNTER — Ambulatory Visit (INDEPENDENT_AMBULATORY_CARE_PROVIDER_SITE_OTHER): Payer: BC Managed Care – PPO

## 2010-12-21 DIAGNOSIS — J309 Allergic rhinitis, unspecified: Secondary | ICD-10-CM

## 2010-12-24 ENCOUNTER — Ambulatory Visit: Payer: BC Managed Care – PPO | Admitting: Internal Medicine

## 2010-12-28 ENCOUNTER — Ambulatory Visit (INDEPENDENT_AMBULATORY_CARE_PROVIDER_SITE_OTHER): Payer: BC Managed Care – PPO

## 2010-12-28 DIAGNOSIS — J309 Allergic rhinitis, unspecified: Secondary | ICD-10-CM

## 2011-01-04 ENCOUNTER — Ambulatory Visit (INDEPENDENT_AMBULATORY_CARE_PROVIDER_SITE_OTHER): Payer: BC Managed Care – PPO

## 2011-01-04 DIAGNOSIS — J309 Allergic rhinitis, unspecified: Secondary | ICD-10-CM

## 2011-01-10 ENCOUNTER — Ambulatory Visit (INDEPENDENT_AMBULATORY_CARE_PROVIDER_SITE_OTHER): Payer: BC Managed Care – PPO

## 2011-01-10 DIAGNOSIS — J309 Allergic rhinitis, unspecified: Secondary | ICD-10-CM

## 2011-01-18 ENCOUNTER — Ambulatory Visit (INDEPENDENT_AMBULATORY_CARE_PROVIDER_SITE_OTHER): Payer: BC Managed Care – PPO

## 2011-01-18 DIAGNOSIS — J309 Allergic rhinitis, unspecified: Secondary | ICD-10-CM

## 2011-01-23 ENCOUNTER — Ambulatory Visit: Payer: BC Managed Care – PPO | Admitting: Internal Medicine

## 2011-01-25 ENCOUNTER — Ambulatory Visit (INDEPENDENT_AMBULATORY_CARE_PROVIDER_SITE_OTHER): Payer: BC Managed Care – PPO

## 2011-01-25 DIAGNOSIS — J309 Allergic rhinitis, unspecified: Secondary | ICD-10-CM

## 2011-01-29 ENCOUNTER — Ambulatory Visit (INDEPENDENT_AMBULATORY_CARE_PROVIDER_SITE_OTHER): Payer: BC Managed Care – PPO

## 2011-01-29 DIAGNOSIS — J309 Allergic rhinitis, unspecified: Secondary | ICD-10-CM

## 2011-01-31 ENCOUNTER — Ambulatory Visit (INDEPENDENT_AMBULATORY_CARE_PROVIDER_SITE_OTHER): Payer: BC Managed Care – PPO

## 2011-01-31 DIAGNOSIS — J309 Allergic rhinitis, unspecified: Secondary | ICD-10-CM

## 2011-02-04 ENCOUNTER — Telehealth: Payer: Self-pay | Admitting: Internal Medicine

## 2011-02-04 NOTE — Telephone Encounter (Signed)
Pt to be here Wednesday at 11am for 1115am appt.

## 2011-02-06 ENCOUNTER — Ambulatory Visit (INDEPENDENT_AMBULATORY_CARE_PROVIDER_SITE_OTHER): Payer: BC Managed Care – PPO | Admitting: Internal Medicine

## 2011-02-06 ENCOUNTER — Encounter: Payer: Self-pay | Admitting: Internal Medicine

## 2011-02-06 ENCOUNTER — Ambulatory Visit (INDEPENDENT_AMBULATORY_CARE_PROVIDER_SITE_OTHER): Payer: BC Managed Care – PPO

## 2011-02-06 VITALS — BP 118/74 | HR 81 | Ht 62.0 in | Wt 236.2 lb

## 2011-02-06 DIAGNOSIS — J309 Allergic rhinitis, unspecified: Secondary | ICD-10-CM

## 2011-02-06 DIAGNOSIS — J4489 Other specified chronic obstructive pulmonary disease: Secondary | ICD-10-CM

## 2011-02-06 DIAGNOSIS — J449 Chronic obstructive pulmonary disease, unspecified: Secondary | ICD-10-CM

## 2011-02-06 DIAGNOSIS — J019 Acute sinusitis, unspecified: Secondary | ICD-10-CM

## 2011-02-06 MED ORDER — AMOXICILLIN-POT CLAVULANATE 875-125 MG PO TABS: 1.0000 | ORAL_TABLET | Freq: Two times a day (BID) | ORAL | Status: AC

## 2011-02-06 MED ORDER — PHENYLEPHRINE HCL 1 % NA SOLN
2.0000 [drp] | Freq: Once | NASAL | Status: AC
  Administered 2011-02-06: 2 [drp] via NASAL

## 2011-02-06 MED ORDER — METHYLPREDNISOLONE ACETATE 80 MG/ML IJ SUSP
80.0000 mg | Freq: Once | INTRAMUSCULAR | Status: AC
  Administered 2011-02-06: 80 mg via INTRAMUSCULAR

## 2011-02-06 NOTE — Assessment & Plan Note (Signed)
She is describing pain along left cheek and angle of jaw that raise possibilities of parotitis, TMJ pain. Will treat today as for a sinus infection and ask her to watch status in case she needs dental or ENT attention.

## 2011-02-06 NOTE — Progress Notes (Signed)
Subjective:    Patient ID: Bonnie Ellison, female    DOB: 1946-04-03, 65 y.o.   MRN: [id number]  HPI  Subjective:    Patient ID: Bonnie Ellison, female    DOB: March 13, 1946, 65 y.o.   MRN: [id number]  HPI 11/15/10- 65 yo with chronic sinusitis, rhinitis, chronic obstructive asthma, obesity hypoventilation Last here Aug 27, 2010- note reviewed. Acute visit- starting 2-3 days ago has been wheezing and blowing nasal congestion. She had been outdoors at park, then gone where shellfish were being cooked outdoors. She added back Singulair yesterday and will start prednisone taper we called in yesterday afternoon. Today a little better, throat itches, head still stopped up, eyes itch.  02/06/11-  65 yo with chronic sinusitis, rhinitis, chronic obstructive asthma, obesity hypoventilation Acute- Blames weather and in and out of heat for onset of building sinus infection. 1 week- Feels tender left upper jaw and tender to touch cheek, teeth. Nothing blowing out of left nostril. No fever. Has Neti pot- reminded to use it.  Allergy vaccine continues vial A 1:50, vial B 1:50,000 GH   Review of Systems Constitutional:   No weight loss, night sweats,  Fevers, chills, fatigue, lassitude. HEENT:   No headaches,  Difficulty swallowing,  Tooth/dental problems,  Sore throat,               CV:  No chest pain,  Orthopnea, PND, swelling in lower extremities, anasarca, dizziness, palpitations  GI  No heartburn, indigestion, abdominal pain, nausea, vomiting, diarrhea, change in bowel habits, loss of appetite  Resp: No shortness of breath with exertion or at rest.  No excess mucus, no productive cough,  No non-productive cough,  No coughing up of blood.  No change in color of mucus.  No wheezing today.    Skin: no rash or lesions.  GU: no dysuria, change in color of urine, no urgency or frequency.  No flank pain.  MS:  No joint pain or swelling.  No decreased range of motion.  No back pain.  Psych:  No  change in mood or affect. No depression or anxiety.  No memory loss.      Objective:   Physical Exam General- Alert, Oriented, Affect-appropriate, Distress- none acute  Obese, rubbing her nose  Skin- rash-none, lesions- none, excoriation- none  Lymphadenopathy- none  Head- atraumatic  Eyes- Gross vision intact, PERRLA, conjunctivae clear secretions   Chronic periorbital edema/ bags  Ears- Hearing, canals- normal Nose- Left nare edematous with clear bubbly mucus, No-  Septal dev, mucus, polyps, erosion, perforation. Cheek is not hot  Throat- Mallampati II , mucosa clear , drainage- none, tonsils- atrophic  Neck- flexible , trachea midline, no stridor , thyroid nl, carotid no bruit  Chest - symmetrical excursion , unlabored     Heart/CV- RRR , no murmur , no gallop  , no rub, nl s1 s2                     - JVD- none , edema- none, stasis changes- none, varices- none     Lung- clear to P&A, wheeze- none, cough- none , dullness-none, rub- none     Chest wall-  Abd- tender-no, distended-no, bowel sounds-present, HSM- no  Br/ Gen/ Rectal- Not done, not indicated  Extrem- cyanosis- none, clubbing, none, atrophy- none, strength- nl  Neuro- grossly intact to observation         Assessment & Plan:     Review of Systems  Objective:   Physical Exam        Assessment & Plan:

## 2011-02-06 NOTE — Patient Instructions (Addendum)
Script augmentin sent   Depo 80  Neb nasal neo

## 2011-02-09 NOTE — Assessment & Plan Note (Signed)
Not having significant lower respiratory problems so far with this illness.

## 2011-02-12 ENCOUNTER — Encounter: Payer: Self-pay | Admitting: Internal Medicine

## 2011-02-15 ENCOUNTER — Ambulatory Visit: Payer: BC Managed Care – PPO | Admitting: Internal Medicine

## 2011-02-15 ENCOUNTER — Ambulatory Visit (INDEPENDENT_AMBULATORY_CARE_PROVIDER_SITE_OTHER): Payer: BC Managed Care – PPO

## 2011-02-15 DIAGNOSIS — J309 Allergic rhinitis, unspecified: Secondary | ICD-10-CM

## 2011-02-21 ENCOUNTER — Ambulatory Visit (INDEPENDENT_AMBULATORY_CARE_PROVIDER_SITE_OTHER): Payer: BC Managed Care – PPO

## 2011-02-21 DIAGNOSIS — J309 Allergic rhinitis, unspecified: Secondary | ICD-10-CM

## 2011-02-28 ENCOUNTER — Ambulatory Visit (INDEPENDENT_AMBULATORY_CARE_PROVIDER_SITE_OTHER): Payer: BC Managed Care – PPO

## 2011-02-28 DIAGNOSIS — J309 Allergic rhinitis, unspecified: Secondary | ICD-10-CM

## 2011-03-06 ENCOUNTER — Telehealth: Payer: Self-pay | Admitting: Internal Medicine

## 2011-03-06 MED ORDER — CEFDINIR 300 MG PO CAPS: 300.0000 mg | ORAL_CAPSULE | Freq: Two times a day (BID) | ORAL | Status: AC

## 2011-03-06 NOTE — Telephone Encounter (Signed)
Pt states she only took Augmentin x 3 days due to severe nausea. She still c/o sinus drainage, facial pain/pressure and sore throat. She is also c/o both ears feeling full. Pls advise. Allergies  Allergen Reactions  . Shellfish Allergy     throat swelling, hives

## 2011-03-06 NOTE — Telephone Encounter (Signed)
Per CDY-d/c Augmentin try Cefdinir 300mg  #20 1 bid x 10 days 0 refills. Pt aware.

## 2011-03-06 NOTE — Telephone Encounter (Signed)
PT C/O SINUS CONGESTION- PRESSURE IN FACE/ CLEAR DRAINAGE IN THROAT X 3 WKS. DENIES FEVER/ COUGH.  PT STATES THAT SHE ONLY TOOK HALF OF THE ABX PRESCRIBED BY DR YOUNG AS THEY CAUSED NAUSEA. PLEASE ADVISE. (PT WAS SEEN 3 WKS AGO FOR SAME). [facility]

## 2011-03-08 ENCOUNTER — Ambulatory Visit (INDEPENDENT_AMBULATORY_CARE_PROVIDER_SITE_OTHER): Payer: BC Managed Care – PPO

## 2011-03-08 DIAGNOSIS — J309 Allergic rhinitis, unspecified: Secondary | ICD-10-CM

## 2011-03-15 ENCOUNTER — Ambulatory Visit (INDEPENDENT_AMBULATORY_CARE_PROVIDER_SITE_OTHER): Payer: BC Managed Care – PPO

## 2011-03-15 DIAGNOSIS — J309 Allergic rhinitis, unspecified: Secondary | ICD-10-CM

## 2011-03-20 ENCOUNTER — Ambulatory Visit (INDEPENDENT_AMBULATORY_CARE_PROVIDER_SITE_OTHER): Payer: BC Managed Care – PPO

## 2011-03-20 DIAGNOSIS — J309 Allergic rhinitis, unspecified: Secondary | ICD-10-CM

## 2011-03-25 ENCOUNTER — Encounter: Payer: Self-pay | Admitting: Internal Medicine

## 2011-03-29 ENCOUNTER — Ambulatory Visit (INDEPENDENT_AMBULATORY_CARE_PROVIDER_SITE_OTHER): Payer: BC Managed Care – PPO

## 2011-03-29 DIAGNOSIS — J309 Allergic rhinitis, unspecified: Secondary | ICD-10-CM

## 2011-04-05 ENCOUNTER — Ambulatory Visit (INDEPENDENT_AMBULATORY_CARE_PROVIDER_SITE_OTHER): Payer: BC Managed Care – PPO

## 2011-04-05 DIAGNOSIS — J309 Allergic rhinitis, unspecified: Secondary | ICD-10-CM

## 2011-04-12 ENCOUNTER — Ambulatory Visit (INDEPENDENT_AMBULATORY_CARE_PROVIDER_SITE_OTHER): Payer: BC Managed Care – PPO

## 2011-04-12 DIAGNOSIS — J309 Allergic rhinitis, unspecified: Secondary | ICD-10-CM

## 2011-04-19 ENCOUNTER — Ambulatory Visit (INDEPENDENT_AMBULATORY_CARE_PROVIDER_SITE_OTHER): Payer: BC Managed Care – PPO

## 2011-04-19 DIAGNOSIS — J309 Allergic rhinitis, unspecified: Secondary | ICD-10-CM

## 2011-04-26 ENCOUNTER — Ambulatory Visit (INDEPENDENT_AMBULATORY_CARE_PROVIDER_SITE_OTHER): Payer: BC Managed Care – PPO

## 2011-04-26 DIAGNOSIS — J309 Allergic rhinitis, unspecified: Secondary | ICD-10-CM

## 2011-05-01 LAB — URINALYSIS, ROUTINE W REFLEX MICROSCOPIC
Bilirubin Urine: NEGATIVE
Glucose, UA: NEGATIVE
Hgb urine dipstick: NEGATIVE
Ketones, ur: NEGATIVE
Nitrite: NEGATIVE
Protein, ur: NEGATIVE
Specific Gravity, Urine: 1.012
Urobilinogen, UA: 0.2
pH: 6

## 2011-05-01 LAB — BASIC METABOLIC PANEL
BUN: 15
CO2: 28
Calcium: 9.5
Chloride: 104
Creatinine, Ser: 0.74
GFR calc Af Amer: >60
GFR calc non Af Amer: >60
Glucose, Bld: 116 — ABNORMAL HIGH
Potassium: 3 — ABNORMAL LOW
Sodium: 141

## 2011-05-01 LAB — CBC
HCT: 40.4
Hemoglobin: 13.6
MCHC: 33.6
MCV: 82.8
Platelets: 217
RBC: 4.88
RDW: 14.9 — ABNORMAL HIGH
WBC: 7.7

## 2011-05-01 LAB — DIFFERENTIAL
Basophils Absolute: 0
Basophils Relative: 0
Eosinophils Absolute: 0.2
Eosinophils Relative: 2
Lymphocytes Relative: 27
Lymphs Abs: 2.1
Monocytes Absolute: 0.6
Monocytes Relative: 8
Neutro Abs: 4.9
Neutrophils Relative %: 63

## 2011-05-01 LAB — D-DIMER, QUANTITATIVE: D-Dimer, Quant: 0.24

## 2011-05-03 ENCOUNTER — Ambulatory Visit (INDEPENDENT_AMBULATORY_CARE_PROVIDER_SITE_OTHER): Payer: BC Managed Care – PPO

## 2011-05-03 DIAGNOSIS — J309 Allergic rhinitis, unspecified: Secondary | ICD-10-CM

## 2011-05-10 ENCOUNTER — Ambulatory Visit (INDEPENDENT_AMBULATORY_CARE_PROVIDER_SITE_OTHER): Payer: BC Managed Care – PPO

## 2011-05-10 DIAGNOSIS — J309 Allergic rhinitis, unspecified: Secondary | ICD-10-CM

## 2011-05-17 ENCOUNTER — Ambulatory Visit (INDEPENDENT_AMBULATORY_CARE_PROVIDER_SITE_OTHER): Payer: BC Managed Care – PPO

## 2011-05-17 DIAGNOSIS — J309 Allergic rhinitis, unspecified: Secondary | ICD-10-CM

## 2011-05-24 ENCOUNTER — Ambulatory Visit (INDEPENDENT_AMBULATORY_CARE_PROVIDER_SITE_OTHER): Payer: BC Managed Care – PPO

## 2011-05-24 DIAGNOSIS — J309 Allergic rhinitis, unspecified: Secondary | ICD-10-CM

## 2011-05-27 ENCOUNTER — Ambulatory Visit (INDEPENDENT_AMBULATORY_CARE_PROVIDER_SITE_OTHER): Payer: BC Managed Care – PPO

## 2011-05-27 DIAGNOSIS — Z23 Encounter for immunization: Secondary | ICD-10-CM

## 2011-05-29 ENCOUNTER — Encounter: Payer: Self-pay | Admitting: Internal Medicine

## 2011-05-31 ENCOUNTER — Ambulatory Visit (INDEPENDENT_AMBULATORY_CARE_PROVIDER_SITE_OTHER): Payer: BC Managed Care – PPO

## 2011-05-31 DIAGNOSIS — J309 Allergic rhinitis, unspecified: Secondary | ICD-10-CM

## 2011-06-13 ENCOUNTER — Telehealth: Payer: Self-pay | Admitting: Internal Medicine

## 2011-06-13 MED ORDER — AZITHROMYCIN 250 MG PO TABS: ORAL_TABLET | ORAL | Status: AC

## 2011-06-13 NOTE — Telephone Encounter (Signed)
Patient is calling back requesting breathing treatment.

## 2011-06-13 NOTE — Telephone Encounter (Signed)
Pt aware of cdy recs. Pt states she will try the zpak to se eif it helps. Rx has been sent [facility] per pt request.

## 2011-06-13 NOTE — Telephone Encounter (Signed)
Spoke with pt. She is c/o prod cough with minimal clear sputum, sore throat, "head stopped up" and chills/sweats x 2 days.  She denies any wheeze, CP, SOB. Taking mucinex otc w/o relief. No openings in the sched this wk with CDY. Please advise, thanks! Allergies  Allergen Reactions  . Shellfish Allergy     throat swelling, hives

## 2011-06-13 NOTE — Telephone Encounter (Signed)
Per CY-offer Zpak #1 take as directed no refills.  

## 2011-06-21 ENCOUNTER — Ambulatory Visit (INDEPENDENT_AMBULATORY_CARE_PROVIDER_SITE_OTHER): Payer: BC Managed Care – PPO

## 2011-06-21 DIAGNOSIS — J309 Allergic rhinitis, unspecified: Secondary | ICD-10-CM

## 2011-06-28 ENCOUNTER — Telehealth: Payer: Self-pay | Admitting: Internal Medicine

## 2011-06-28 ENCOUNTER — Ambulatory Visit (INDEPENDENT_AMBULATORY_CARE_PROVIDER_SITE_OTHER): Payer: BC Managed Care – PPO | Admitting: Internal Medicine

## 2011-06-28 ENCOUNTER — Ambulatory Visit (INDEPENDENT_AMBULATORY_CARE_PROVIDER_SITE_OTHER): Payer: BC Managed Care – PPO

## 2011-06-28 ENCOUNTER — Encounter: Payer: Self-pay | Admitting: Internal Medicine

## 2011-06-28 DIAGNOSIS — J309 Allergic rhinitis, unspecified: Secondary | ICD-10-CM

## 2011-06-28 DIAGNOSIS — J449 Chronic obstructive pulmonary disease, unspecified: Secondary | ICD-10-CM

## 2011-06-28 MED ORDER — PREDNISONE 10 MG PO TABS: ORAL_TABLET | ORAL | Status: AC

## 2011-06-28 MED ORDER — METHYLPREDNISOLONE ACETATE 80 MG/ML IJ SUSP
80.0000 mg | Freq: Once | INTRAMUSCULAR | Status: AC
  Administered 2011-06-28: 80 mg via INTRAMUSCULAR

## 2011-06-28 MED ORDER — LEVALBUTEROL HCL 0.63 MG/3ML IN NEBU
0.6300 mg | INHALATION_SOLUTION | Freq: Once | RESPIRATORY_TRACT | Status: AC
  Administered 2011-06-28: 0.63 mg via RESPIRATORY_TRACT

## 2011-06-28 NOTE — Telephone Encounter (Signed)
Per CY-okay to work in with someone today.

## 2011-06-28 NOTE — Telephone Encounter (Addendum)
Spoke with Bonnie Ellison and she stated to go ahead and add the Bonnie Ellison on to CY schedule for today at 3:30.  Called and spoke with Bonnie Ellison and she is on her way to the office now.

## 2011-06-28 NOTE — Patient Instructions (Signed)
Neb xop 0.63  Depo 80  Script to hold for prednisone taper

## 2011-06-28 NOTE — Telephone Encounter (Signed)
Called and spoke with pt and she is requesting to be seen today. Pt stated that she has been wheezing all night long. She does use the rescue inhaler and this does clear up for a while but then comes back.  Pt stated that she is coughing with clear sputum.  No fever at this time. CY please advise.  Thanks   Allergies  Allergen Reactions  . Shellfish Allergy     throat swelling, hives

## 2011-06-28 NOTE — Telephone Encounter (Signed)
Spoke with Leigh-will have patient come on in to the office to be seen by CY today at 330pm.

## 2011-06-28 NOTE — Progress Notes (Signed)
Patient ID: Bonnie Ellison, female    DOB: Mar 11, 1946, 65 y.o.   MRN: [id number]  HPI 11/15/10- 16 yo with chronic sinusitis, rhinitis, chronic obstructive asthma, obesity hypoventilation Last here Aug 27, 2010- note reviewed. Acute visit- starting 2-3 days ago has been wheezing and blowing nasal congestion. She had been outdoors at park, then gone where shellfish were being cooked outdoors. She added back Singulair yesterday and will start prednisone taper we called in yesterday afternoon. Today a little better, throat itches, head still stopped up, eyes itch.  02/06/11-  53 yo with chronic sinusitis, rhinitis, chronic obstructive asthma, obesity hypoventilation Acute- Blames weather and in and out of heat for onset of building sinus infection. 1 week- Feels tender left upper jaw and tender to touch cheek, teeth. Nothing blowing out of left nostril. No fever. Has Neti pot- reminded to use it.  Allergy vaccine continues vial A 1:50, vial B 1:50,000 GH  06/28/11-  65 yo with chronic sinusitis, rhinitis, chronic obstructive asthma, obesity hypoventilation Has had flu vaccine. Says near the kitchen at a [employer] last night where the fumes made chest tight and made her face itch. She stayed to eat her meal and says the food was not the problem. Leaving the restaurant and stepping out into the very cold air made her tighter. She does not think she has a cold.  Review of Systems Constitutional:   No-   weight loss, night sweats, fevers, chills, fatigue, lassitude. HEENT:   No-  headaches, difficulty swallowing, tooth/dental problems, sore throat,       No-  sneezing, itching, ear ache, nasal congestion, post nasal drip,  CV:  No-   chest pain, orthopnea, PND, swelling in lower extremities, anasarca,                                  dizziness, palpitations Resp: Increased shortness of breath with exertion or at rest.              No-   productive cough,  No non-productive cough,  No-  coughing up of blood.              No-   change in color of mucus.  + wheezing.   Skin: No-   rash or lesions. GI:  No-   heartburn, indigestion, abdominal pain, nausea, vomiting, diarrhea,                 change in bowel habits, loss of appetite GU:  MS:  No-   joint pain or swelling.  No- decreased range of motion.  No- back pain. Neuro-     nothing unusual Psych:  No- change in mood or affect. No depression or anxiety.  No memory loss.      Objective:   Physical Exam General- Alert, Oriented, Affect-appropriate, Distress- none acute Skin- rash-none, lesions- none, excoriation- none Lymphadenopathy- none Head- atraumatic            Eyes- Gross vision intact, PERRLA, conjunctivae clear secretions. Chronic periorbital edema            Ears- Hearing, canals-normal            Nose- Clear, no-Septal dev, mucus, polyps, erosion, perforation             Throat- Mallampati II , mucosa clear , drainage- none, tonsils- atrophic Neck- flexible , trachea midline, no stridor , thyroid nl,  carotid no bruit Chest - symmetrical excursion , unlabored           Heart/CV- RRR , no murmur , no gallop  , no rub, nl s1 s2                           - JVD- none , edema- none, stasis changes- none, varices- none           Lung- diminished breath sounds, unlabored, trace wheeze, cough- none , dullness-none, rub- none           Chest wall-  Abd- tender-no, distended-no, bowel sounds-present, HSM- no Br/ Gen/ Rectal- Not done, not indicated Extrem- cyanosis- none, clubbing, none, atrophy- none, strength- nl Neuro- grossly intact to observation

## 2011-06-30 NOTE — Assessment & Plan Note (Signed)
Acute asthma exacerbation due to the extrinsic factors-fumes at the restaurant followed by exposure to cold air. With the weekend here, we discussed steroid therapy. Plan- nebulizer treatment, depomedrol. Prednisone taper to hold.

## 2011-07-01 ENCOUNTER — Ambulatory Visit: Payer: BC Managed Care – PPO | Admitting: Gynecology

## 2011-07-04 ENCOUNTER — Ambulatory Visit (INDEPENDENT_AMBULATORY_CARE_PROVIDER_SITE_OTHER): Payer: BC Managed Care – PPO

## 2011-07-04 DIAGNOSIS — J309 Allergic rhinitis, unspecified: Secondary | ICD-10-CM

## 2011-07-11 ENCOUNTER — Ambulatory Visit (INDEPENDENT_AMBULATORY_CARE_PROVIDER_SITE_OTHER): Payer: BC Managed Care – PPO | Admitting: Gynecology

## 2011-07-11 ENCOUNTER — Ambulatory Visit (INDEPENDENT_AMBULATORY_CARE_PROVIDER_SITE_OTHER): Payer: BC Managed Care – PPO

## 2011-07-11 ENCOUNTER — Encounter: Payer: Self-pay | Admitting: Gynecology

## 2011-07-11 VITALS — BP 130/84 | Ht 62.0 in | Wt 231.0 lb

## 2011-07-11 DIAGNOSIS — N951 Menopausal and female climacteric states: Secondary | ICD-10-CM

## 2011-07-11 DIAGNOSIS — N952 Postmenopausal atrophic vaginitis: Secondary | ICD-10-CM

## 2011-07-11 DIAGNOSIS — Z79890 Hormone replacement therapy (postmenopausal): Secondary | ICD-10-CM

## 2011-07-11 DIAGNOSIS — K219 Gastro-esophageal reflux disease without esophagitis: Secondary | ICD-10-CM | POA: Insufficient documentation

## 2011-07-11 DIAGNOSIS — J309 Allergic rhinitis, unspecified: Secondary | ICD-10-CM

## 2011-07-11 DIAGNOSIS — Z01419 Encounter for gynecological examination (general) (routine) without abnormal findings: Secondary | ICD-10-CM

## 2011-07-11 MED ORDER — ESTRADIOL 0.5 MG PO TABS: 0.5000 mg | ORAL_TABLET | Freq: Every day | ORAL | Status: DC

## 2011-07-11 NOTE — Progress Notes (Signed)
Bonnie Ellison [birth year] [id number]        65 y.o.  New patient for annual exam.  Former patient of Dr. Leota Sauers. Had been on Estrace 1 mg HRT since her hysterectomy 20 years ago for leiomyoma and bleeding. She ran out of her Estrace 2 months ago and has been having significant hot flashes and night sweats.  Past medical history,surgical history, medications, allergies, family history and social history were all reviewed and documented in the EPIC chart. ROS:  Was performed and pertinent positives and negatives are included in the history.  Exam: chaperone present Filed Vitals:   07/11/11 1100  BP: 130/84   General appearance  Normal Skin grossly normal Head/Neck normal with no cervical or supraclavicular adenopathy thyroid normal Lungs  clear Cardiac RR, without RMG Abdominal  soft, nontender, without masses, organomegaly or hernia Breasts  examined lying and sitting without masses, retractions, discharge or axillary adenopathy. Pelvic  Ext/BUS/vagina  normal with mild atrophic changes  Adnexa  Without masses or tenderness    Anus and perineum  normal   Rectovaginal  normal sphincter tone without palpated masses or tenderness.    Assessment/Plan:  65 y.o. female for annual exam.    1. HRT. Patient is status post TAH/BSO in the past and has been on Estrace 1 mg. She's having significant symptoms off of her HRT and wants to reinitiate. I reviewed the WHI study with her to include the increased risk of stroke, heart attack, DVT as well as possible increased breast cancer risk. I reviewed the ACOG and NAMS statements for lowest dose for shortest period of time. I reviewed oral versus transdermal absorption and the first pass effect. After lengthy discussion she wants to continue on oral estrogen replacement and accepts the risks. I did suggest we try a lower dose at 0.5 mg and see how she does with this and I wrote for this with one year refill. 2. Breast health. SBE monthly  reviewed. She is due for her mammogram now and actually has it scheduled and will follow up for this. 3. Pap smear. I reviewed current Pap smear screening guidelines. Her last Pap smear was normal in 2011and she has multiple normal reports in her chart. I reviewed less frequent screening options versus stopping altogether as she is status post hysterectomy for benign indication and turning 66 next year. I did not do a Pap smear today and we'll rediscuss this on an annual basis. 4. Bone health. She had a bone density through her primary physician's office 2 years ago and reported to be normal. She will continue to follow up with them in reference to this.  Increase calcium vitamin D reviewed 5. Colonoscopy. She had a colonoscopy 5 years ago with recommended repeat it a 10 year interval. 6. Health maintenance. [facility] sees [facility] and no blood work was done today as it is all done through their office.    Dara Lords MD, 11:34 AM 07/11/2011

## 2011-07-11 NOTE — Patient Instructions (Signed)
Start Estrace 0.5 mg daily as we discussed. Report any issues with this to me.

## 2011-07-19 ENCOUNTER — Ambulatory Visit (INDEPENDENT_AMBULATORY_CARE_PROVIDER_SITE_OTHER): Payer: BC Managed Care – PPO

## 2011-07-19 DIAGNOSIS — J309 Allergic rhinitis, unspecified: Secondary | ICD-10-CM

## 2011-07-22 ENCOUNTER — Encounter: Payer: Self-pay | Admitting: Internal Medicine

## 2011-07-22 ENCOUNTER — Other Ambulatory Visit: Payer: Self-pay | Admitting: Gynecology

## 2011-07-22 DIAGNOSIS — Z1231 Encounter for screening mammogram for malignant neoplasm of breast: Secondary | ICD-10-CM

## 2011-07-23 ENCOUNTER — Encounter: Payer: Self-pay | Admitting: Gynecology

## 2011-07-24 ENCOUNTER — Ambulatory Visit (INDEPENDENT_AMBULATORY_CARE_PROVIDER_SITE_OTHER): Payer: BC Managed Care – PPO

## 2011-07-24 DIAGNOSIS — J309 Allergic rhinitis, unspecified: Secondary | ICD-10-CM

## 2011-07-26 ENCOUNTER — Ambulatory Visit: Payer: BC Managed Care – PPO

## 2011-08-01 ENCOUNTER — Ambulatory Visit (INDEPENDENT_AMBULATORY_CARE_PROVIDER_SITE_OTHER): Payer: BC Managed Care – PPO

## 2011-08-01 DIAGNOSIS — J309 Allergic rhinitis, unspecified: Secondary | ICD-10-CM

## 2011-08-07 ENCOUNTER — Ambulatory Visit (INDEPENDENT_AMBULATORY_CARE_PROVIDER_SITE_OTHER): Payer: BC Managed Care – PPO

## 2011-08-07 DIAGNOSIS — J309 Allergic rhinitis, unspecified: Secondary | ICD-10-CM

## 2011-08-09 ENCOUNTER — Ambulatory Visit: Payer: BC Managed Care – PPO | Admitting: Internal Medicine

## 2011-08-09 ENCOUNTER — Ambulatory Visit: Payer: BC Managed Care – PPO

## 2011-08-16 ENCOUNTER — Ambulatory Visit (INDEPENDENT_AMBULATORY_CARE_PROVIDER_SITE_OTHER): Payer: BC Managed Care – PPO

## 2011-08-16 DIAGNOSIS — J309 Allergic rhinitis, unspecified: Secondary | ICD-10-CM

## 2011-08-20 ENCOUNTER — Ambulatory Visit (INDEPENDENT_AMBULATORY_CARE_PROVIDER_SITE_OTHER): Payer: BC Managed Care – PPO

## 2011-08-20 ENCOUNTER — Ambulatory Visit
Admission: RE | Admit: 2011-08-20 | Discharge: 2011-08-20 | Disposition: A | Discharge status: Home / Self Care | Payer: BC Managed Care – PPO | Source: Ambulatory Visit | Attending: Gynecology | Admitting: Gynecology

## 2011-08-20 DIAGNOSIS — J309 Allergic rhinitis, unspecified: Secondary | ICD-10-CM

## 2011-08-20 DIAGNOSIS — Z1231 Encounter for screening mammogram for malignant neoplasm of breast: Secondary | ICD-10-CM

## 2011-08-23 ENCOUNTER — Ambulatory Visit (INDEPENDENT_AMBULATORY_CARE_PROVIDER_SITE_OTHER): Payer: BC Managed Care – PPO

## 2011-08-23 DIAGNOSIS — J309 Allergic rhinitis, unspecified: Secondary | ICD-10-CM

## 2011-08-30 ENCOUNTER — Encounter: Payer: Self-pay | Admitting: Internal Medicine

## 2011-08-30 ENCOUNTER — Ambulatory Visit (INDEPENDENT_AMBULATORY_CARE_PROVIDER_SITE_OTHER): Payer: BC Managed Care – PPO | Admitting: Internal Medicine

## 2011-08-30 ENCOUNTER — Ambulatory Visit (INDEPENDENT_AMBULATORY_CARE_PROVIDER_SITE_OTHER): Payer: BC Managed Care – PPO

## 2011-08-30 VITALS — BP 124/74 | HR 78 | Ht 63.0 in | Wt 232.2 lb

## 2011-08-30 DIAGNOSIS — L509 Urticaria, unspecified: Secondary | ICD-10-CM

## 2011-08-30 DIAGNOSIS — J301 Allergic rhinitis due to pollen: Secondary | ICD-10-CM

## 2011-08-30 DIAGNOSIS — J309 Allergic rhinitis, unspecified: Secondary | ICD-10-CM

## 2011-08-30 MED ORDER — ALBUTEROL SULFATE HFA 108 (90 BASE) MCG/ACT IN AERS: 2.0000 | INHALATION_SPRAY | Freq: Four times a day (QID) | RESPIRATORY_TRACT | Status: DC | PRN

## 2011-08-30 NOTE — Patient Instructions (Signed)
Ok to try generic Allegra "fexofenadine" as an antihistamine if needed. You can test how much you need it by skipping doses. You can keep some in your purse, and take it if you first start noticing an episode starting, to see how quickly the antihistamine works. You can take it ahead of time, if you are going into a situation that might trigger your swelling.   Proair rescue inhaler script. You can give this to your pharmacy and ask they put it on hold until you need it.

## 2011-08-30 NOTE — Progress Notes (Signed)
Patient ID: Bonnie Ellison, female    DOB: July 02, 1946, 66 y.o.   MRN: [id number]  HPI 11/15/10- 11 yo with chronic sinusitis, rhinitis, chronic obstructive asthma, obesity hypoventilation Last here Aug 27, 2010- note reviewed. Acute visit- starting 2-3 days ago has been wheezing and blowing nasal congestion. She had been outdoors at park, then gone where shellfish were being cooked outdoors. She added back Singulair yesterday and will start prednisone taper we called in yesterday afternoon. Today a little better, throat itches, head still stopped up, eyes itch.  02/06/11-  61 yo with chronic sinusitis, rhinitis, chronic obstructive asthma, obesity hypoventilation Acute- Blames weather and in and out of heat for onset of building sinus infection. 1 week- Feels tender left upper jaw and tender to touch cheek, teeth. Nothing blowing out of left nostril. No fever. Has Neti pot- reminded to use it.  Allergy vaccine continues vial A 1:50, vial B 1:50,000 GH  06/28/11-  66 yo with chronic sinusitis, rhinitis, chronic obstructive asthma, obesity hypoventilation Has had flu vaccine. Says ate near the kitchen at a [employer] last night where the fumes made chest tight and made her face itch. She stayed to eat her meal and says the food was not the problem. Leaving the restaurant and stepping out into the very cold air made her tighter. She does not think she has a cold.  08/30/11-65 yoF never smoker with chronic sinusitis, rhinitis, chronic obstructive asthma, obesity hypoventilation She has done well since last here in December. Uses daily Allegra to prevent swelling of left tongue and cheek. Denies wheeze, sinus pain, purulent drainage or hives. She continues allergy vaccine without problems but has never been able to progress beyond 1-50,000.  Review of Systems-see HPI Constitutional:   No-   weight loss, night sweats, fevers, chills, fatigue, lassitude. HEENT:   No-  headaches, difficulty  swallowing, tooth/dental problems, sore throat,       No-  sneezing, itching, ear ache, nasal congestion, post nasal drip,  CV:  No-   chest pain, orthopnea, PND, swelling in lower extremities, anasarca, dizziness, palpitations Resp: Increased shortness of breath with exertion or at rest.              No-   productive cough,  No non-productive cough,  No- coughing up of blood.              No-   change in color of mucus.  + wheezing.   Skin: No-   rash or lesions. No hives. GI:  No-   heartburn, indigestion, abdominal pain, nausea, vomiting, diarrhea,                 change in bowel habits, loss of appetite GU:  MS:  No-   joint pain or swelling.  No- decreased range of motion.  No- back pain. Neuro-     nothing unusual Psych:  No- change in mood or affect. No depression or anxiety.  No memory loss.      Objective:   Physical Exam General- Alert, Oriented, Affect-appropriate, Distress- none acute, overweight Skin- rash-none, lesions- none, excoriation- none Lymphadenopathy- none Head- atraumatic            Eyes- Gross vision intact, PERRLA, conjunctivae clear secretions. Chronic periorbital edema            Ears- Hearing, canals-normal            Nose- Clear, no-Septal dev, mucus, polyps, erosion, perforation  Throat- Mallampati II , mucosa clear , drainage- none, tonsils- atrophic, clear w/o swelling. Much dental repair. Neck- flexible , trachea midline, no stridor , thyroid nl, carotid no bruit Chest - symmetrical excursion , unlabored           Heart/CV- RRR , no murmur , no gallop  , no rub, nl s1 s2                           - JVD- none , edema- none, stasis changes- none, varices- none           Lung- diminished breath sounds, unlabored, trace wheeze, cough- none , dullness-none, rub- none           Chest wall-  Abd- Br/ Gen/ Rectal- Not done, not indicated Extrem- cyanosis- none, clubbing, none, atrophy- none, strength- nl Neuro- grossly intact to  observation

## 2011-09-02 NOTE — Assessment & Plan Note (Signed)
No recent hives. She is describing possible angioedema of left tongue and cheek with no pattern. Antihistamines seem sufficient. There has been no respiratory distress.

## 2011-09-02 NOTE — Assessment & Plan Note (Signed)
She is intensely atopic. She has felt allergy vaccine helped at this concentration. We have discussed antihistamines. She is describing what may be angioedema of left tongue and cheek as new process.

## 2011-09-06 ENCOUNTER — Ambulatory Visit (INDEPENDENT_AMBULATORY_CARE_PROVIDER_SITE_OTHER): Payer: BC Managed Care – PPO

## 2011-09-06 DIAGNOSIS — J309 Allergic rhinitis, unspecified: Secondary | ICD-10-CM

## 2011-09-12 ENCOUNTER — Encounter: Payer: Self-pay | Admitting: Internal Medicine

## 2011-09-13 ENCOUNTER — Ambulatory Visit (INDEPENDENT_AMBULATORY_CARE_PROVIDER_SITE_OTHER): Payer: BC Managed Care – PPO

## 2011-09-13 DIAGNOSIS — J309 Allergic rhinitis, unspecified: Secondary | ICD-10-CM

## 2011-09-15 ENCOUNTER — Emergency Department (INDEPENDENT_AMBULATORY_CARE_PROVIDER_SITE_OTHER)
Admission: EM | Admit: 2011-09-15 | Discharge: 2011-09-15 | Disposition: A | Discharge status: Home Health | Payer: BC Managed Care – PPO | Source: Home / Self Care | Attending: Family Medicine | Admitting: Family Medicine

## 2011-09-15 ENCOUNTER — Encounter (HOSPITAL_COMMUNITY): Payer: Self-pay

## 2011-09-15 DIAGNOSIS — J069 Acute upper respiratory infection, unspecified: Secondary | ICD-10-CM

## 2011-09-15 MED ORDER — IPRATROPIUM BROMIDE 0.06 % NA SOLN: 2.0000 | Freq: Four times a day (QID) | NASAL | Status: DC

## 2011-09-15 MED ORDER — HYDROCOD POLST-CHLORPHEN POLST 10-8 MG/5ML PO LQCR: 5.0000 mL | Freq: Two times a day (BID) | ORAL | Status: DC

## 2011-09-15 NOTE — ED Notes (Signed)
Pt states she has congestion, sore throat, fever, headache, nausea all started yesterday

## 2011-09-15 NOTE — ED Provider Notes (Signed)
History     CSN: [id number]  Arrival date & time 09/15/11  1704   First MD Initiated Contact with Patient 09/15/11 1714      Chief Complaint  Patient presents with  . Nasal Congestion    congestion, headache, fever, sore throat, nausea    (Consider location/radiation/quality/duration/timing/severity/associated sxs/prior treatment) Patient is a 66 y.o. female presenting with cough. The history is provided by the patient.  Cough This is a new problem. The current episode started yesterday. The problem occurs constantly. The problem has been gradually worsening. The cough is non-productive. There has been no fever. Associated symptoms include rhinorrhea and sore throat. She is not a smoker.    Past Medical History  Diagnosis Date  . Other hyperalimentation   . Urticaria   . Other chronic sinusitis   . Allergic rhinitis, cause unspecified   . Unspecified asthma   . Chronic airway obstruction, not elsewhere classified   . GERD (gastroesophageal reflux disease)   . Hypertension   . Acid reflux     Past Surgical History  Procedure Date  . Total abdominal hysterectomy w/ bilateral salpingoophorectomy   . Cesarean section   . Eye surgery     DETACHED RETINA RIGHT EYE...   . Cataract extraction     RIGHT EYE  . Abdominal hysterectomy 1991    ABDOMINAL HYSTERECTOMY  leiomyomata    Family History  Problem Relation Age of Onset  . Heart failure Mother   . Heart disease Mother   . Pancreatic cancer Father   . Cancer Father 2    PANCREATIC (DECEASED)  . Diabetes Brother     DEACESED.Marland Kitchen HAD A KIDNEY TRANSPLANT  . Stroke Brother 26    DECEASED    History  Substance Use Topics  . Smoking status: Never Smoker   . Smokeless tobacco: Never Used  . Alcohol Use: No    OB History    Grav Para Term Preterm Abortions TAB SAB Ect Mult Living   2 2 2       2       Review of Systems  Constitutional: Negative.   HENT: Positive for congestion, sore throat, rhinorrhea and  postnasal drip.   Respiratory: Positive for cough.     Allergies  Shellfish allergy  Home Medications   Current Outpatient Rx  Name Route Sig Dispense Refill  . ACETAMINOPHEN 325 MG PO TABS Oral Take 650 mg by mouth every 6 (six) hours as needed.      . ALBUTEROL SULFATE HFA 108 (90 BASE) MCG/ACT IN AERS Inhalation Inhale 2 puffs into the lungs every 6 (six) hours as needed for wheezing or shortness of breath. 1 Inhaler prn  . HYDROCOD POLST-CPM POLST ER 10-8 MG/5ML PO LQCR Oral Take 5 mLs by mouth every 12 (twelve) hours. 115 mL 0  . EPINEPHRINE 0.3 MG/0.3ML IJ DEVI Intramuscular Inject 0.3 mg into the muscle once.      Marland Kitchen ESTRADIOL 0.5 MG PO TABS Oral Take 1 tablet (0.5 mg total) by mouth daily. 30 tablet 11  . FEXOFENADINE HCL 180 MG PO TABS Oral Take 180 mg by mouth daily.      Marland Kitchen HYDROCODONE-HOMATROPINE 5-1.5 MG/5ML PO SYRP Oral Take 5 mLs by mouth every 6 (six) hours as needed.      . IPRATROPIUM BROMIDE 0.06 % NA SOLN Nasal Place 2 sprays into the nose 4 (four) times daily. 15 mL 1  . OLMESARTAN MEDOXOMIL-HCTZ 40-25 MG PO TABS Oral Take 1 tablet by mouth daily.      Marland Kitchen  OMEPRAZOLE 40 MG PO CPDR Oral Take 40 mg by mouth daily as needed.      Marland Kitchen PREDNISONE 10 MG PO TABS  4 X 2 DAYS, 3 X 2 DAYS, 2 X 2 DAYS, 1 X 2 DAYS  20 tablet 0    BP 141/74  Pulse 107  Temp(Src) 98.4 F (36.9 C) (Oral)  Resp 20  SpO2 99%  LMP 05/10/1990  Physical Exam  Nursing note and vitals reviewed. Constitutional: She is oriented to person, place, and time. She appears well-developed and well-nourished.  HENT:  Head: Normocephalic.  Right Ear: External ear normal.  Left Ear: External ear normal.  Nose: Mucosal edema and rhinorrhea present.  Mouth/Throat: Oropharynx is clear and moist.  Eyes: Conjunctivae and EOM are normal. Pupils are equal, round, and reactive to light.  Neck: Normal range of motion. Neck supple.  Cardiovascular: Normal rate, normal heart sounds and intact distal pulses.     Pulmonary/Chest: Effort normal and breath sounds normal.  Neurological: She is alert and oriented to person, place, and time.  Skin: Skin is warm and dry.    ED Course  Procedures (including critical care time)  Labs Reviewed - No data to display No results found.   1. URI (upper respiratory infection)       MDM          Barkley Bruns, MD 09/15/11 [phone]

## 2011-09-20 ENCOUNTER — Ambulatory Visit (INDEPENDENT_AMBULATORY_CARE_PROVIDER_SITE_OTHER): Payer: BC Managed Care – PPO

## 2011-09-20 DIAGNOSIS — J309 Allergic rhinitis, unspecified: Secondary | ICD-10-CM

## 2011-09-26 ENCOUNTER — Ambulatory Visit (INDEPENDENT_AMBULATORY_CARE_PROVIDER_SITE_OTHER): Payer: BC Managed Care – PPO

## 2011-09-26 DIAGNOSIS — J309 Allergic rhinitis, unspecified: Secondary | ICD-10-CM

## 2011-10-04 ENCOUNTER — Ambulatory Visit (INDEPENDENT_AMBULATORY_CARE_PROVIDER_SITE_OTHER): Payer: BC Managed Care – PPO

## 2011-10-04 DIAGNOSIS — J309 Allergic rhinitis, unspecified: Secondary | ICD-10-CM

## 2011-10-14 ENCOUNTER — Ambulatory Visit (INDEPENDENT_AMBULATORY_CARE_PROVIDER_SITE_OTHER): Payer: BC Managed Care – PPO

## 2011-10-14 DIAGNOSIS — J309 Allergic rhinitis, unspecified: Secondary | ICD-10-CM

## 2011-10-24 ENCOUNTER — Ambulatory Visit (INDEPENDENT_AMBULATORY_CARE_PROVIDER_SITE_OTHER): Payer: BC Managed Care – PPO

## 2011-10-24 DIAGNOSIS — J309 Allergic rhinitis, unspecified: Secondary | ICD-10-CM

## 2011-10-28 ENCOUNTER — Encounter: Payer: Self-pay | Admitting: Internal Medicine

## 2011-11-01 ENCOUNTER — Ambulatory Visit (INDEPENDENT_AMBULATORY_CARE_PROVIDER_SITE_OTHER): Payer: BC Managed Care – PPO

## 2011-11-01 DIAGNOSIS — J309 Allergic rhinitis, unspecified: Secondary | ICD-10-CM

## 2011-11-08 ENCOUNTER — Ambulatory Visit (INDEPENDENT_AMBULATORY_CARE_PROVIDER_SITE_OTHER): Payer: BC Managed Care – PPO

## 2011-11-08 DIAGNOSIS — J309 Allergic rhinitis, unspecified: Secondary | ICD-10-CM

## 2011-11-22 ENCOUNTER — Ambulatory Visit (INDEPENDENT_AMBULATORY_CARE_PROVIDER_SITE_OTHER): Payer: BC Managed Care – PPO

## 2011-11-22 DIAGNOSIS — J309 Allergic rhinitis, unspecified: Secondary | ICD-10-CM

## 2011-11-29 ENCOUNTER — Ambulatory Visit (INDEPENDENT_AMBULATORY_CARE_PROVIDER_SITE_OTHER): Payer: Medicare Other

## 2011-11-29 DIAGNOSIS — J309 Allergic rhinitis, unspecified: Secondary | ICD-10-CM

## 2011-12-05 ENCOUNTER — Ambulatory Visit (INDEPENDENT_AMBULATORY_CARE_PROVIDER_SITE_OTHER): Payer: Medicare Other

## 2011-12-05 DIAGNOSIS — J309 Allergic rhinitis, unspecified: Secondary | ICD-10-CM

## 2011-12-13 ENCOUNTER — Ambulatory Visit (INDEPENDENT_AMBULATORY_CARE_PROVIDER_SITE_OTHER): Payer: Medicare Other

## 2011-12-13 DIAGNOSIS — J309 Allergic rhinitis, unspecified: Secondary | ICD-10-CM

## 2011-12-19 ENCOUNTER — Ambulatory Visit (INDEPENDENT_AMBULATORY_CARE_PROVIDER_SITE_OTHER): Payer: Medicare Other

## 2011-12-19 DIAGNOSIS — J309 Allergic rhinitis, unspecified: Secondary | ICD-10-CM

## 2011-12-24 ENCOUNTER — Encounter: Payer: Self-pay | Admitting: Internal Medicine

## 2011-12-27 ENCOUNTER — Ambulatory Visit (INDEPENDENT_AMBULATORY_CARE_PROVIDER_SITE_OTHER): Payer: Medicare Other

## 2011-12-27 DIAGNOSIS — J309 Allergic rhinitis, unspecified: Secondary | ICD-10-CM

## 2011-12-30 ENCOUNTER — Other Ambulatory Visit: Payer: Self-pay | Admitting: Internal Medicine

## 2012-01-01 ENCOUNTER — Ambulatory Visit (INDEPENDENT_AMBULATORY_CARE_PROVIDER_SITE_OTHER): Payer: Medicare Other

## 2012-01-01 DIAGNOSIS — J309 Allergic rhinitis, unspecified: Secondary | ICD-10-CM

## 2012-01-10 ENCOUNTER — Ambulatory Visit (INDEPENDENT_AMBULATORY_CARE_PROVIDER_SITE_OTHER): Payer: Medicare Other

## 2012-01-10 DIAGNOSIS — J309 Allergic rhinitis, unspecified: Secondary | ICD-10-CM

## 2012-01-17 ENCOUNTER — Ambulatory Visit (INDEPENDENT_AMBULATORY_CARE_PROVIDER_SITE_OTHER): Payer: Medicare Other

## 2012-01-17 DIAGNOSIS — J309 Allergic rhinitis, unspecified: Secondary | ICD-10-CM

## 2012-01-30 ENCOUNTER — Ambulatory Visit (INDEPENDENT_AMBULATORY_CARE_PROVIDER_SITE_OTHER): Payer: Medicare Other

## 2012-01-30 DIAGNOSIS — J309 Allergic rhinitis, unspecified: Secondary | ICD-10-CM

## 2012-02-07 ENCOUNTER — Ambulatory Visit (INDEPENDENT_AMBULATORY_CARE_PROVIDER_SITE_OTHER): Payer: Medicare Other

## 2012-02-07 DIAGNOSIS — J309 Allergic rhinitis, unspecified: Secondary | ICD-10-CM

## 2012-02-14 ENCOUNTER — Ambulatory Visit (INDEPENDENT_AMBULATORY_CARE_PROVIDER_SITE_OTHER): Payer: Medicare Other

## 2012-02-14 DIAGNOSIS — J309 Allergic rhinitis, unspecified: Secondary | ICD-10-CM

## 2012-02-21 ENCOUNTER — Ambulatory Visit (INDEPENDENT_AMBULATORY_CARE_PROVIDER_SITE_OTHER): Payer: Medicare Other

## 2012-02-21 DIAGNOSIS — J309 Allergic rhinitis, unspecified: Secondary | ICD-10-CM

## 2012-02-25 ENCOUNTER — Encounter: Payer: Self-pay | Admitting: Internal Medicine

## 2012-02-28 ENCOUNTER — Ambulatory Visit (INDEPENDENT_AMBULATORY_CARE_PROVIDER_SITE_OTHER): Payer: Medicare Other

## 2012-02-28 ENCOUNTER — Ambulatory Visit: Payer: BC Managed Care – PPO | Admitting: Internal Medicine

## 2012-02-28 DIAGNOSIS — J309 Allergic rhinitis, unspecified: Secondary | ICD-10-CM

## 2012-03-02 ENCOUNTER — Ambulatory Visit (INDEPENDENT_AMBULATORY_CARE_PROVIDER_SITE_OTHER): Payer: Medicare Other

## 2012-03-02 DIAGNOSIS — J309 Allergic rhinitis, unspecified: Secondary | ICD-10-CM

## 2012-03-06 ENCOUNTER — Ambulatory Visit (INDEPENDENT_AMBULATORY_CARE_PROVIDER_SITE_OTHER): Payer: Medicare Other

## 2012-03-06 DIAGNOSIS — J309 Allergic rhinitis, unspecified: Secondary | ICD-10-CM

## 2012-03-13 ENCOUNTER — Ambulatory Visit (INDEPENDENT_AMBULATORY_CARE_PROVIDER_SITE_OTHER): Payer: Medicare Other

## 2012-03-13 DIAGNOSIS — J309 Allergic rhinitis, unspecified: Secondary | ICD-10-CM

## 2012-03-20 ENCOUNTER — Ambulatory Visit (INDEPENDENT_AMBULATORY_CARE_PROVIDER_SITE_OTHER): Payer: Medicare Other

## 2012-03-20 DIAGNOSIS — J309 Allergic rhinitis, unspecified: Secondary | ICD-10-CM

## 2012-03-27 ENCOUNTER — Ambulatory Visit (INDEPENDENT_AMBULATORY_CARE_PROVIDER_SITE_OTHER): Payer: Medicare Other

## 2012-03-27 DIAGNOSIS — J309 Allergic rhinitis, unspecified: Secondary | ICD-10-CM

## 2012-04-03 ENCOUNTER — Ambulatory Visit (INDEPENDENT_AMBULATORY_CARE_PROVIDER_SITE_OTHER): Payer: Medicare Other

## 2012-04-03 DIAGNOSIS — J309 Allergic rhinitis, unspecified: Secondary | ICD-10-CM

## 2012-04-10 ENCOUNTER — Encounter: Payer: Self-pay | Admitting: Internal Medicine

## 2012-04-10 ENCOUNTER — Ambulatory Visit (INDEPENDENT_AMBULATORY_CARE_PROVIDER_SITE_OTHER): Payer: Medicare Other

## 2012-04-10 ENCOUNTER — Ambulatory Visit (INDEPENDENT_AMBULATORY_CARE_PROVIDER_SITE_OTHER): Payer: Medicare Other | Admitting: Internal Medicine

## 2012-04-10 VITALS — BP 128/86 | HR 80 | Ht 62.0 in | Wt 222.4 lb

## 2012-04-10 DIAGNOSIS — J302 Other seasonal allergic rhinitis: Secondary | ICD-10-CM

## 2012-04-10 DIAGNOSIS — J45998 Other asthma: Secondary | ICD-10-CM

## 2012-04-10 DIAGNOSIS — J309 Allergic rhinitis, unspecified: Secondary | ICD-10-CM

## 2012-04-10 DIAGNOSIS — J45909 Unspecified asthma, uncomplicated: Secondary | ICD-10-CM

## 2012-04-10 DIAGNOSIS — Z23 Encounter for immunization: Secondary | ICD-10-CM

## 2012-04-10 NOTE — Progress Notes (Signed)
Patient ID: Bonnie Ellison, female    DOB: Apr 04, 1946, 66 y.o.   MRN: [id number]  HPI 11/15/10- 70 yo with chronic sinusitis, rhinitis, chronic obstructive asthma, obesity hypoventilation Last here Aug 27, 2010- note reviewed. Acute visit- starting 2-3 days ago has been wheezing and blowing nasal congestion. She had been outdoors at park, then gone where shellfish were being cooked outdoors. She added back Singulair yesterday and will start prednisone taper we called in yesterday afternoon. Today a little better, throat itches, head still stopped up, eyes itch.  02/06/11-  6 yo with chronic sinusitis, rhinitis, chronic obstructive asthma, obesity hypoventilation Acute- Blames weather and in and out of heat for onset of building sinus infection. 1 week- Feels tender left upper jaw and tender to touch cheek, teeth. Nothing blowing out of left nostril. No fever. Has Neti pot- reminded to use it.  Allergy vaccine continues vial A 1:50, vial B 1:50,000 GH  06/28/11-  40 yo with chronic sinusitis, rhinitis, chronic obstructive asthma, obesity hypoventilation Has had flu vaccine. Says ate near the kitchen at a [employer] last night where the fumes made chest tight and made her face itch. She stayed to eat her meal and says the food was not the problem. Leaving the restaurant and stepping out into the very cold air made her tighter. She does not think she has a cold.  08/30/11-65 yoF never smoker with chronic sinusitis, rhinitis, chronic obstructive asthma, obesity hypoventilation She has done well since last here in December. Uses daily Allegra to prevent swelling of left tongue and cheek. Denies wheeze, sinus pain, purulent drainage or hives. She continues allergy vaccine without problems but has never been able to progress beyond 1-50,000.  04/10/12- 65 yoF never smoker with chronic sinusitis, rhinitis, chronic obstructive asthma, obesity hypoventilation Pt states doing well, still on vaccine  1:50,000  GHholding dose.. Denies co's.  She has no reactions at this concentration and volume of allergy vaccine, which is the highest she has tolerated. Had food allergy testing by Dr. Renae Gloss and reports allergy(actually elevated antibodies0 including wheat and almonds. She thinks these might make her lips itch, but is unsure. We discussed the difference between presence of antibodies to foods on blood test versus actual physiologic pattern of symptomatic allergic response. I advised to watch closely those foods she tested positive for and avoid any foods that cause her symptoms. NB- next visit update PFT/ AP/ ? This  Review of Systems-see HPI Constitutional:   No-   weight loss, night sweats, fevers, chills, fatigue, lassitude. HEENT:   No-  headaches, difficulty swallowing, tooth/dental problems, sore throat,       No-  sneezing, itching, ear ache, nasal congestion, post nasal drip,  CV:  No-   chest pain, orthopnea, PND, swelling in lower extremities, anasarca, dizziness, palpitations Resp: Increased shortness of breath with exertion or at rest.              No-   productive cough,  No non-productive cough,  No- coughing up of blood.              No-   change in color of mucus.  Little recent wheezing.   Skin: No-   rash or lesions. No hives. GI:  No-   heartburn, indigestion, abdominal pain, nausea, vomiting,  GU:  MS:  No-   joint pain or swelling.   Neuro-     nothing unusual Psych:  No- change in mood or affect. No depression  or anxiety.  No memory loss. Objective:   Physical Exam General- Alert, Oriented, Affect-appropriate, Distress- none acute, overweight Skin- rash-none, lesions- none, excoriation- none Lymphadenopathy- none Head- atraumatic            Eyes- Gross vision intact, PERRLA, conjunctivae clear secretions. Chronic periorbital edema            Ears- Hearing, canals-normal            Nose- Clear, no-Septal dev, mucus, polyps, erosion, perforation             Throat-  Mallampati II , mucosa clear , drainage- none, tonsils- atrophic, clear w/o swelling. Much dental repair. Neck- flexible , trachea midline, no stridor , thyroid nl, carotid no bruit Chest - symmetrical excursion , unlabored           Heart/CV- RRR , no murmur , no gallop  , no rub, nl s1 s2                           - JVD- none , edema- none, stasis changes- none, varices- none           Lung- unusually clear today, cough- none , dullness-none, rub- none           Chest wall-  Abd- Br/ Gen/ Rectal- Not done, not indicated Extrem- cyanosis- none, clubbing, none, atrophy- none, strength- nl Neuro- grossly intact to observation

## 2012-04-10 NOTE — Patient Instructions (Addendum)
We can continue allergy vaccine  Flu vax

## 2012-04-17 ENCOUNTER — Ambulatory Visit (INDEPENDENT_AMBULATORY_CARE_PROVIDER_SITE_OTHER): Payer: Medicare Other

## 2012-04-17 DIAGNOSIS — J309 Allergic rhinitis, unspecified: Secondary | ICD-10-CM

## 2012-04-19 NOTE — Assessment & Plan Note (Signed)
She remains quite sensitive but is currently in good control. Plan-flu vaccine

## 2012-04-19 NOTE — Assessment & Plan Note (Signed)
She is doing well now without recent exacerbation.

## 2012-04-23 ENCOUNTER — Encounter: Payer: Self-pay | Admitting: Internal Medicine

## 2012-04-24 ENCOUNTER — Ambulatory Visit (INDEPENDENT_AMBULATORY_CARE_PROVIDER_SITE_OTHER): Payer: Medicare Other

## 2012-04-24 DIAGNOSIS — J309 Allergic rhinitis, unspecified: Secondary | ICD-10-CM

## 2012-05-01 ENCOUNTER — Ambulatory Visit (INDEPENDENT_AMBULATORY_CARE_PROVIDER_SITE_OTHER): Payer: Medicare Other

## 2012-05-01 DIAGNOSIS — J309 Allergic rhinitis, unspecified: Secondary | ICD-10-CM

## 2012-05-08 ENCOUNTER — Ambulatory Visit (INDEPENDENT_AMBULATORY_CARE_PROVIDER_SITE_OTHER): Payer: Medicare Other

## 2012-05-08 DIAGNOSIS — J309 Allergic rhinitis, unspecified: Secondary | ICD-10-CM

## 2012-05-15 ENCOUNTER — Ambulatory Visit (INDEPENDENT_AMBULATORY_CARE_PROVIDER_SITE_OTHER): Payer: Medicare Other

## 2012-05-15 DIAGNOSIS — J309 Allergic rhinitis, unspecified: Secondary | ICD-10-CM

## 2012-05-22 ENCOUNTER — Ambulatory Visit (INDEPENDENT_AMBULATORY_CARE_PROVIDER_SITE_OTHER): Payer: Medicare Other

## 2012-05-22 DIAGNOSIS — J309 Allergic rhinitis, unspecified: Secondary | ICD-10-CM

## 2012-05-29 ENCOUNTER — Ambulatory Visit (INDEPENDENT_AMBULATORY_CARE_PROVIDER_SITE_OTHER): Payer: Medicare Other

## 2012-05-29 DIAGNOSIS — J309 Allergic rhinitis, unspecified: Secondary | ICD-10-CM

## 2012-06-01 ENCOUNTER — Ambulatory Visit (INDEPENDENT_AMBULATORY_CARE_PROVIDER_SITE_OTHER): Payer: Medicare Other

## 2012-06-01 DIAGNOSIS — J309 Allergic rhinitis, unspecified: Secondary | ICD-10-CM

## 2012-06-08 ENCOUNTER — Ambulatory Visit (INDEPENDENT_AMBULATORY_CARE_PROVIDER_SITE_OTHER): Payer: Medicare Other

## 2012-06-08 DIAGNOSIS — J309 Allergic rhinitis, unspecified: Secondary | ICD-10-CM

## 2012-06-19 ENCOUNTER — Ambulatory Visit (INDEPENDENT_AMBULATORY_CARE_PROVIDER_SITE_OTHER): Payer: Medicare Other

## 2012-06-19 ENCOUNTER — Encounter: Payer: Self-pay | Admitting: Internal Medicine

## 2012-06-19 DIAGNOSIS — J309 Allergic rhinitis, unspecified: Secondary | ICD-10-CM

## 2012-06-26 ENCOUNTER — Ambulatory Visit (INDEPENDENT_AMBULATORY_CARE_PROVIDER_SITE_OTHER): Payer: Medicare Other

## 2012-06-26 DIAGNOSIS — J309 Allergic rhinitis, unspecified: Secondary | ICD-10-CM

## 2012-07-03 ENCOUNTER — Ambulatory Visit (INDEPENDENT_AMBULATORY_CARE_PROVIDER_SITE_OTHER): Payer: Medicare Other

## 2012-07-03 DIAGNOSIS — J309 Allergic rhinitis, unspecified: Secondary | ICD-10-CM

## 2012-07-13 ENCOUNTER — Ambulatory Visit (INDEPENDENT_AMBULATORY_CARE_PROVIDER_SITE_OTHER): Payer: Medicare Other

## 2012-07-13 DIAGNOSIS — J309 Allergic rhinitis, unspecified: Secondary | ICD-10-CM

## 2012-07-24 ENCOUNTER — Ambulatory Visit (INDEPENDENT_AMBULATORY_CARE_PROVIDER_SITE_OTHER): Payer: Medicare Other

## 2012-07-24 DIAGNOSIS — J309 Allergic rhinitis, unspecified: Secondary | ICD-10-CM

## 2012-07-30 ENCOUNTER — Encounter: Payer: Self-pay | Admitting: Internal Medicine

## 2012-07-31 ENCOUNTER — Ambulatory Visit (INDEPENDENT_AMBULATORY_CARE_PROVIDER_SITE_OTHER): Payer: Medicare Other

## 2012-07-31 DIAGNOSIS — J309 Allergic rhinitis, unspecified: Secondary | ICD-10-CM

## 2012-08-07 ENCOUNTER — Ambulatory Visit (INDEPENDENT_AMBULATORY_CARE_PROVIDER_SITE_OTHER): Payer: Medicare Other

## 2012-08-07 DIAGNOSIS — J309 Allergic rhinitis, unspecified: Secondary | ICD-10-CM

## 2012-08-10 ENCOUNTER — Ambulatory Visit (INDEPENDENT_AMBULATORY_CARE_PROVIDER_SITE_OTHER): Payer: Medicare Other | Admitting: Gynecology

## 2012-08-10 ENCOUNTER — Encounter: Payer: Self-pay | Admitting: Gynecology

## 2012-08-10 VITALS — BP 130/84 | Ht 62.0 in | Wt 210.0 lb

## 2012-08-10 DIAGNOSIS — Z79890 Hormone replacement therapy (postmenopausal): Secondary | ICD-10-CM

## 2012-08-10 DIAGNOSIS — N952 Postmenopausal atrophic vaginitis: Secondary | ICD-10-CM

## 2012-08-10 DIAGNOSIS — N898 Other specified noninflammatory disorders of vagina: Secondary | ICD-10-CM

## 2012-08-10 DIAGNOSIS — L293 Anogenital pruritus, unspecified: Secondary | ICD-10-CM

## 2012-08-10 DIAGNOSIS — N760 Acute vaginitis: Secondary | ICD-10-CM

## 2012-08-10 DIAGNOSIS — A499 Bacterial infection, unspecified: Secondary | ICD-10-CM

## 2012-08-10 DIAGNOSIS — L292 Pruritus vulvae: Secondary | ICD-10-CM

## 2012-08-10 LAB — WET PREP FOR TRICH, YEAST, CLUE
Clue Cells Wet Prep HPF POC: NONE SEEN
Trich, Wet Prep: NONE SEEN
Yeast Wet Prep HPF POC: NONE SEEN

## 2012-08-10 MED ORDER — ESTRADIOL 0.5 MG PO TABS: 0.5000 mg | ORAL_TABLET | Freq: Every day | ORAL | Status: DC

## 2012-08-10 MED ORDER — CLINDAMYCIN PHOSPHATE 2 % VA CREA: 1.0000 | TOPICAL_CREAM | Freq: Every day | VAGINAL | Status: DC

## 2012-08-10 NOTE — Progress Notes (Signed)
Bonnie Ellison May 25, 1946 [id number]        67 y.o.  [id number] for follow up exam. Several issues noted below. Past medical history,surgical history, medications, allergies, family history and social history were all reviewed and documented in the EPIC chart. ROS:  Was performed and pertinent positives and negatives are included in the history.  Exam: Kim assistant Filed Vitals:   08/10/12 0958  BP: 130/84  Height: 5\' 2"  (1.575 m)  Weight: 210 lb (95.255 kg)   General appearance  Normal Skin grossly normal Head/Neck normal with no cervical or supraclavicular adenopathy thyroid normal Lungs  clear Cardiac RR, without RMG Abdominal  soft, nontender, without masses, organomegaly or hernia Breasts  examined lying and sitting without masses, retractions, discharge or axillary adenopathy. Pelvic  Ext/BUS/vagina  With mild atrophic changes. White discharge noted.  Adnexa  Without masses or tenderness    Anus and perineum  normal   Rectovaginal  normal sphincter tone without palpated masses or tenderness.    Assessment/Plan:  67 y.o. [id number] female for annual exam.   1. Vulvar pruritus/white discharge. Patient noted some itching on the outside of her vagina. Exam does show a thick white discharge. Wet prep is consistent with bacterial vaginosis. We'll treat with Cleocin vaginal cream nightly x7 days. Follow up if symptoms persist or recur. 2. ERT. Patient continues on estradiol 0.5 mg. She still having some hot flashes but better than when she tried to stop it. I again reviewed the issues of HRT and the risks benefits to include the WHI study with stroke heart attack DVT and breast cancer. Lowest dose for shortness period of time recommendations by ACOG/NAMS discussed. Patient wants to continue she understands accepts risks I refilled her estradiol 0.5 mg times one year. 3. Mammogram 08/2011. Patient due in February I reminded her to schedule this. SBE monthly reviewed. 4. DEXA reportedly 2  years ago and normal. I do not have a copy of this report as it is arranged through her primary physician's office. I recommended repeated 5 year interval.  Increase calcium vitamin D reviewed. 5. Pap smear 2011. No Pap smear done today. I reviewed current screening guidelines. She is age 67 and status post hysterectomy for benign indications. We both agree on stop screening at this point and she is comfortable with this. 6. Colonoscopy 4 years ago. We'll follow up with their recommended interval. 7. Health maintenance. No blood work done as it is all done through her primary physician's office. Follow up in one year, sooner as needed.    Dara Lords MD, 11:19 AM 08/10/2012

## 2012-08-10 NOTE — Patient Instructions (Signed)
Use vaginal cream nightly for 7 days.  Follow up if symptoms persisit. Follow up for annual exam in one year

## 2012-08-11 LAB — URINALYSIS W MICROSCOPIC + REFLEX CULTURE
Bilirubin Urine: NEGATIVE
Casts: NONE SEEN
Crystals: NONE SEEN
Glucose, UA: NEGATIVE mg/dL
Hgb urine dipstick: NEGATIVE
Ketones, ur: NEGATIVE mg/dL
Leukocytes, UA: NEGATIVE
Nitrite: NEGATIVE
Protein, ur: NEGATIVE mg/dL
Specific Gravity, Urine: 1.011 (ref 1.005–1.030)
Urobilinogen, UA: 0.2 mg/dL (ref 0.0–1.0)
pH: 7.5 (ref 5.0–8.0)

## 2012-08-14 ENCOUNTER — Ambulatory Visit (INDEPENDENT_AMBULATORY_CARE_PROVIDER_SITE_OTHER): Payer: Medicare Other

## 2012-08-14 DIAGNOSIS — J309 Allergic rhinitis, unspecified: Secondary | ICD-10-CM

## 2012-08-21 ENCOUNTER — Ambulatory Visit (INDEPENDENT_AMBULATORY_CARE_PROVIDER_SITE_OTHER): Payer: Medicare Other

## 2012-08-21 DIAGNOSIS — J309 Allergic rhinitis, unspecified: Secondary | ICD-10-CM

## 2012-08-28 ENCOUNTER — Ambulatory Visit: Payer: Medicare Other

## 2012-09-01 ENCOUNTER — Other Ambulatory Visit: Payer: Self-pay | Admitting: Gynecology

## 2012-09-01 DIAGNOSIS — Z1231 Encounter for screening mammogram for malignant neoplasm of breast: Secondary | ICD-10-CM

## 2012-09-04 ENCOUNTER — Ambulatory Visit (INDEPENDENT_AMBULATORY_CARE_PROVIDER_SITE_OTHER): Payer: Medicare Other

## 2012-09-04 DIAGNOSIS — J309 Allergic rhinitis, unspecified: Secondary | ICD-10-CM

## 2012-09-11 ENCOUNTER — Ambulatory Visit (INDEPENDENT_AMBULATORY_CARE_PROVIDER_SITE_OTHER): Payer: Medicare Other

## 2012-09-11 DIAGNOSIS — J309 Allergic rhinitis, unspecified: Secondary | ICD-10-CM

## 2012-09-14 ENCOUNTER — Telehealth: Payer: Self-pay | Admitting: *Deleted

## 2012-09-14 MED ORDER — FLUCONAZOLE 150 MG PO TABS: 150.0000 mg | ORAL_TABLET | Freq: Once | ORAL | Status: DC

## 2012-09-14 NOTE — Telephone Encounter (Signed)
Recommend trial of Diflucan 150 mg x1 dose.

## 2012-09-14 NOTE — Telephone Encounter (Signed)
Pt informed, rx sent 

## 2012-09-14 NOTE — Telephone Encounter (Signed)
Pt was given cleocin cream on 08/10/12 she took all medication, for about 2 weeks now c/o vaginal itching and burning at times. She asked if any can could be given? Please advise

## 2012-09-18 ENCOUNTER — Ambulatory Visit: Payer: Medicare Other

## 2012-09-24 ENCOUNTER — Ambulatory Visit (INDEPENDENT_AMBULATORY_CARE_PROVIDER_SITE_OTHER): Payer: Medicare Other

## 2012-09-24 DIAGNOSIS — J309 Allergic rhinitis, unspecified: Secondary | ICD-10-CM

## 2012-09-25 ENCOUNTER — Ambulatory Visit (INDEPENDENT_AMBULATORY_CARE_PROVIDER_SITE_OTHER): Payer: Medicare Other

## 2012-09-25 DIAGNOSIS — J309 Allergic rhinitis, unspecified: Secondary | ICD-10-CM

## 2012-09-29 ENCOUNTER — Ambulatory Visit: Payer: Medicare Other

## 2012-10-01 ENCOUNTER — Ambulatory Visit (INDEPENDENT_AMBULATORY_CARE_PROVIDER_SITE_OTHER): Payer: Medicare Other

## 2012-10-01 DIAGNOSIS — J309 Allergic rhinitis, unspecified: Secondary | ICD-10-CM

## 2012-10-02 ENCOUNTER — Ambulatory Visit
Admission: RE | Admit: 2012-10-02 | Discharge: 2012-10-02 | Disposition: A | Discharge status: Home / Self Care | Payer: Medicare Other | Source: Ambulatory Visit | Attending: Gynecology | Admitting: Gynecology

## 2012-10-02 DIAGNOSIS — Z1231 Encounter for screening mammogram for malignant neoplasm of breast: Secondary | ICD-10-CM

## 2012-10-07 ENCOUNTER — Encounter: Payer: Self-pay | Admitting: Internal Medicine

## 2012-10-08 ENCOUNTER — Ambulatory Visit: Payer: Medicare Other

## 2012-10-09 ENCOUNTER — Ambulatory Visit (INDEPENDENT_AMBULATORY_CARE_PROVIDER_SITE_OTHER): Payer: Medicare Other | Admitting: Internal Medicine

## 2012-10-09 ENCOUNTER — Ambulatory Visit (INDEPENDENT_AMBULATORY_CARE_PROVIDER_SITE_OTHER): Payer: Medicare Other

## 2012-10-09 ENCOUNTER — Encounter: Payer: Self-pay | Admitting: Internal Medicine

## 2012-10-09 VITALS — BP 132/92 | HR 92 | Ht 62.5 in | Wt 221.2 lb

## 2012-10-09 DIAGNOSIS — L509 Urticaria, unspecified: Secondary | ICD-10-CM

## 2012-10-09 DIAGNOSIS — J309 Allergic rhinitis, unspecified: Secondary | ICD-10-CM

## 2012-10-09 DIAGNOSIS — J328 Other chronic sinusitis: Secondary | ICD-10-CM

## 2012-10-09 DIAGNOSIS — J45909 Unspecified asthma, uncomplicated: Secondary | ICD-10-CM

## 2012-10-09 DIAGNOSIS — J302 Other seasonal allergic rhinitis: Secondary | ICD-10-CM

## 2012-10-09 DIAGNOSIS — J45998 Other asthma: Secondary | ICD-10-CM

## 2012-10-09 MED ORDER — AZELASTINE-FLUTICASONE 137-50 MCG/ACT NA SUSP: 1.0000 | Freq: Every day | NASAL | Status: DC

## 2012-10-09 NOTE — Progress Notes (Signed)
Patient ID: Migdalia Dk, female    DOB: 02-03-1946, 67 y.o.   MRN: [id number]  HPI 11/15/10- 25 yo with chronic sinusitis, rhinitis, chronic obstructive asthma, obesity hypoventilation Last here Aug 27, 2010- note reviewed. Acute visit- starting 2-3 days ago has been wheezing and blowing nasal congestion. She had been outdoors at park, then gone where shellfish were being cooked outdoors. She added back Singulair yesterday and will start prednisone taper we called in yesterday afternoon. Today a little better, throat itches, head still stopped up, eyes itch.  02/06/11-  85 yo with chronic sinusitis, rhinitis, chronic obstructive asthma, obesity hypoventilation Acute- Blames weather and in and out of heat for onset of building sinus infection. 1 week- Feels tender left upper jaw and tender to touch cheek, teeth. Nothing blowing out of left nostril. No fever. Has Neti pot- reminded to use it.  Allergy vaccine continues vial A 1:50, vial B 1:50,000 GH  06/28/11-  62 yo with chronic sinusitis, rhinitis, chronic obstructive asthma, obesity hypoventilation Has had flu vaccine. Says ate near the kitchen at a [employer] last night where the fumes made chest tight and made her face itch. She stayed to eat her meal and says the food was not the problem. Leaving the restaurant and stepping out into the very cold air made her tighter. She does not think she has a cold.  08/30/11-65 yoF never smoker with chronic sinusitis, rhinitis, chronic obstructive asthma, obesity hypoventilation She has done well since last here in December. Uses daily Allegra to prevent swelling of left tongue and cheek. Denies wheeze, sinus pain, purulent drainage or hives. She continues allergy vaccine without problems but has never been able to progress beyond 1-50,000.  04/10/12- 65 yoF never smoker with chronic sinusitis, rhinitis, chronic obstructive asthma, obesity hypoventilation Pt states doing well, still on vaccine  1:50,000  GHholding dose.. Denies co's.  She has no reactions at this concentration and volume of allergy vaccine, which is the highest she has tolerated. Had food allergy testing by Dr. Renae Gloss and reports allergy(actually elevated antibodies including wheat and almonds. She thinks these might make her lips itch, but is unsure. We discussed the difference between presence of antibodies to foods on blood test versus actual physiologic pattern of symptomatic allergic response. I advised to watch closely those foods she tested positive for and avoid any foods that cause her symptoms. NB- next visit update PFT/ AP/ ? This  10/09/12- 65 yoF never smoker with chronic sinusitis, rhinitis, chronic obstructive asthma, obesity hypoventilation Follows for : pt states doing well , still on vaccine 1:50,000 . Denies any complaints Admits occasional sinus headache/frontal pressure and sometimes some pain in upper teeth. Little nasal discharge. Never able to stay on vaccine stronger than 1:50,000 because of local reactions. No systemic reaction. She feels the vaccine helps her.  Review of Systems-see HPI Constitutional:   No-   weight loss, night sweats, fevers, chills, fatigue, lassitude. HEENT:   + headaches,  No-difficulty swallowing, tooth/dental problems, sore throat,       No-  sneezing, itching, ear ache, +nasal congestion, post nasal drip,  CV:  No-   chest pain, orthopnea, PND, swelling in lower extremities, anasarca, dizziness, palpitations Resp: Increased shortness of breath with exertion or at rest.              No-   productive cough,  No non-productive cough,  No- coughing up of blood.  No-   change in color of mucus.  Little recent wheezing.   Skin: No-   rash or lesions. No hives. GI:  No-   heartburn, indigestion, abdominal pain, nausea, vomiting,  GU:  MS:  No-   joint pain or swelling.   Neuro-     nothing unusual Psych:  No- change in mood or affect. No depression or anxiety.   No memory loss. Objective:   Physical Exam General- Alert, Oriented, Affect-appropriate, Distress- none acute, overweight Skin- rash-none, lesions- none, excoriation- none Lymphadenopathy- none Head- atraumatic            Eyes- Gross vision intact, PERRLA, conjunctivae clear secretions. +Chronic periorbital edema            Ears- Hearing, canals-normal            Nose- +turbinate edema, no-Septal dev, mucus, polyps, erosion, perforation             Throat- Mallampati II , mucosa clear , drainage- none, tonsils- atrophic, clear w/o swelling. Much dental repair. Neck- flexible , trachea midline, no stridor , thyroid nl, carotid no bruit Chest - symmetrical excursion , unlabored           Heart/CV- RRR , no murmur , no gallop  , no rub, nl s1 s2                           - JVD- none , edema- none, stasis changes- none, varices- none           Lung-  clear , cough- none , dullness-none, rub- none           Chest wall-  Abd- Br/ Gen/ Rectal- Not done, not indicated Extrem- cyanosis- none, clubbing, none, atrophy- none, strength- nl Neuro- grossly intact to observation

## 2012-10-09 NOTE — Patient Instructions (Addendum)
We can continue allergy vaccine at 1:50,000  [facility]  Sample Dymista nasal spray    1-2 puffs each nostril once daily at bedtime    Suggest you add a decongestant med each morning- Sudafed-PE otc   You can take this with allegra, or by itself

## 2012-10-16 ENCOUNTER — Ambulatory Visit (INDEPENDENT_AMBULATORY_CARE_PROVIDER_SITE_OTHER): Payer: Medicare Other

## 2012-10-16 DIAGNOSIS — J309 Allergic rhinitis, unspecified: Secondary | ICD-10-CM

## 2012-10-17 NOTE — Assessment & Plan Note (Signed)
Well controlled no recent hives.

## 2012-10-17 NOTE — Assessment & Plan Note (Signed)
No recent acute infection. Seasonal allergy can make this worse for her. Plan-sample Dymista nasal spray, add Sudafed-PE and continue allergy vaccine.

## 2012-10-17 NOTE — Assessment & Plan Note (Signed)
Good control

## 2012-10-17 NOTE — Assessment & Plan Note (Signed)
No recent acute infection. Seasonal allergy can make this worse for her. Plan-sample Dymista nasal spray, add Sudafed-PE and continue allergy vaccine. 

## 2012-10-23 ENCOUNTER — Ambulatory Visit (INDEPENDENT_AMBULATORY_CARE_PROVIDER_SITE_OTHER): Payer: Medicare Other

## 2012-10-23 DIAGNOSIS — J309 Allergic rhinitis, unspecified: Secondary | ICD-10-CM

## 2012-10-29 ENCOUNTER — Ambulatory Visit (INDEPENDENT_AMBULATORY_CARE_PROVIDER_SITE_OTHER): Payer: Medicare Other

## 2012-10-29 DIAGNOSIS — J309 Allergic rhinitis, unspecified: Secondary | ICD-10-CM

## 2012-11-06 ENCOUNTER — Ambulatory Visit (INDEPENDENT_AMBULATORY_CARE_PROVIDER_SITE_OTHER): Payer: Medicare Other

## 2012-11-06 DIAGNOSIS — J309 Allergic rhinitis, unspecified: Secondary | ICD-10-CM

## 2012-11-13 ENCOUNTER — Ambulatory Visit (INDEPENDENT_AMBULATORY_CARE_PROVIDER_SITE_OTHER): Payer: Medicare Other

## 2012-11-13 DIAGNOSIS — J309 Allergic rhinitis, unspecified: Secondary | ICD-10-CM

## 2012-11-20 ENCOUNTER — Ambulatory Visit (INDEPENDENT_AMBULATORY_CARE_PROVIDER_SITE_OTHER): Payer: Medicare Other

## 2012-11-20 DIAGNOSIS — J309 Allergic rhinitis, unspecified: Secondary | ICD-10-CM

## 2012-11-27 ENCOUNTER — Ambulatory Visit (INDEPENDENT_AMBULATORY_CARE_PROVIDER_SITE_OTHER): Payer: Medicare Other

## 2012-11-27 DIAGNOSIS — J309 Allergic rhinitis, unspecified: Secondary | ICD-10-CM

## 2012-11-30 ENCOUNTER — Encounter: Payer: Self-pay | Admitting: Internal Medicine

## 2012-12-04 ENCOUNTER — Ambulatory Visit (INDEPENDENT_AMBULATORY_CARE_PROVIDER_SITE_OTHER): Payer: Medicare Other

## 2012-12-04 DIAGNOSIS — J309 Allergic rhinitis, unspecified: Secondary | ICD-10-CM

## 2012-12-10 ENCOUNTER — Ambulatory Visit (INDEPENDENT_AMBULATORY_CARE_PROVIDER_SITE_OTHER): Payer: Medicare Other

## 2012-12-10 DIAGNOSIS — J309 Allergic rhinitis, unspecified: Secondary | ICD-10-CM

## 2012-12-11 ENCOUNTER — Ambulatory Visit (INDEPENDENT_AMBULATORY_CARE_PROVIDER_SITE_OTHER): Payer: Medicare Other

## 2012-12-11 DIAGNOSIS — J309 Allergic rhinitis, unspecified: Secondary | ICD-10-CM

## 2012-12-18 ENCOUNTER — Ambulatory Visit (INDEPENDENT_AMBULATORY_CARE_PROVIDER_SITE_OTHER): Payer: Medicare Other

## 2012-12-18 DIAGNOSIS — J309 Allergic rhinitis, unspecified: Secondary | ICD-10-CM

## 2012-12-25 ENCOUNTER — Ambulatory Visit (INDEPENDENT_AMBULATORY_CARE_PROVIDER_SITE_OTHER): Payer: Medicare Other

## 2012-12-25 DIAGNOSIS — J309 Allergic rhinitis, unspecified: Secondary | ICD-10-CM

## 2012-12-31 ENCOUNTER — Ambulatory Visit (INDEPENDENT_AMBULATORY_CARE_PROVIDER_SITE_OTHER): Payer: Medicare Other

## 2012-12-31 DIAGNOSIS — J309 Allergic rhinitis, unspecified: Secondary | ICD-10-CM

## 2013-01-01 ENCOUNTER — Ambulatory Visit: Payer: Medicare Other

## 2013-01-08 ENCOUNTER — Ambulatory Visit (INDEPENDENT_AMBULATORY_CARE_PROVIDER_SITE_OTHER): Payer: Medicare Other

## 2013-01-08 DIAGNOSIS — J309 Allergic rhinitis, unspecified: Secondary | ICD-10-CM

## 2013-01-14 ENCOUNTER — Ambulatory Visit (INDEPENDENT_AMBULATORY_CARE_PROVIDER_SITE_OTHER): Payer: Medicare Other

## 2013-01-14 DIAGNOSIS — J309 Allergic rhinitis, unspecified: Secondary | ICD-10-CM

## 2013-01-15 ENCOUNTER — Ambulatory Visit (INDEPENDENT_AMBULATORY_CARE_PROVIDER_SITE_OTHER): Payer: Medicare Other

## 2013-01-15 DIAGNOSIS — J309 Allergic rhinitis, unspecified: Secondary | ICD-10-CM

## 2013-01-22 ENCOUNTER — Ambulatory Visit (INDEPENDENT_AMBULATORY_CARE_PROVIDER_SITE_OTHER): Payer: Medicare Other

## 2013-01-22 DIAGNOSIS — J309 Allergic rhinitis, unspecified: Secondary | ICD-10-CM

## 2013-01-29 ENCOUNTER — Ambulatory Visit (INDEPENDENT_AMBULATORY_CARE_PROVIDER_SITE_OTHER): Payer: Medicare Other

## 2013-01-29 DIAGNOSIS — J309 Allergic rhinitis, unspecified: Secondary | ICD-10-CM

## 2013-02-05 ENCOUNTER — Ambulatory Visit (INDEPENDENT_AMBULATORY_CARE_PROVIDER_SITE_OTHER): Payer: Medicare Other

## 2013-02-05 DIAGNOSIS — J309 Allergic rhinitis, unspecified: Secondary | ICD-10-CM

## 2013-02-11 ENCOUNTER — Ambulatory Visit (INDEPENDENT_AMBULATORY_CARE_PROVIDER_SITE_OTHER): Payer: Medicare Other

## 2013-02-11 DIAGNOSIS — J309 Allergic rhinitis, unspecified: Secondary | ICD-10-CM

## 2013-02-12 ENCOUNTER — Ambulatory Visit: Payer: Medicare Other

## 2013-02-19 ENCOUNTER — Ambulatory Visit (INDEPENDENT_AMBULATORY_CARE_PROVIDER_SITE_OTHER): Payer: Medicare Other

## 2013-02-19 DIAGNOSIS — J309 Allergic rhinitis, unspecified: Secondary | ICD-10-CM

## 2013-02-26 ENCOUNTER — Ambulatory Visit (INDEPENDENT_AMBULATORY_CARE_PROVIDER_SITE_OTHER): Payer: Medicare Other

## 2013-02-26 DIAGNOSIS — J309 Allergic rhinitis, unspecified: Secondary | ICD-10-CM

## 2013-03-05 ENCOUNTER — Ambulatory Visit (INDEPENDENT_AMBULATORY_CARE_PROVIDER_SITE_OTHER): Payer: Medicare Other

## 2013-03-05 DIAGNOSIS — J309 Allergic rhinitis, unspecified: Secondary | ICD-10-CM

## 2013-03-07 ENCOUNTER — Other Ambulatory Visit: Payer: Self-pay | Admitting: Internal Medicine

## 2013-03-12 ENCOUNTER — Ambulatory Visit: Payer: Medicare Other

## 2013-03-16 ENCOUNTER — Ambulatory Visit (INDEPENDENT_AMBULATORY_CARE_PROVIDER_SITE_OTHER): Payer: Medicare Other

## 2013-03-16 ENCOUNTER — Telehealth: Payer: Self-pay | Admitting: Internal Medicine

## 2013-03-16 DIAGNOSIS — J309 Allergic rhinitis, unspecified: Secondary | ICD-10-CM

## 2013-03-16 MED ORDER — AMOXICILLIN-POT CLAVULANATE 875-125 MG PO TABS: 1.0000 | ORAL_TABLET | Freq: Two times a day (BID) | ORAL | Status: DC

## 2013-03-16 NOTE — Telephone Encounter (Signed)
Pt returned triage's call.  Holly D Pryor ° °

## 2013-03-16 NOTE — Telephone Encounter (Signed)
[email or web address] x1 

## 2013-03-16 NOTE — Telephone Encounter (Signed)
k

## 2013-03-16 NOTE — Telephone Encounter (Signed)
i called and spoke with pt and is aware recs. rx has been sent

## 2013-03-16 NOTE — Telephone Encounter (Signed)
Per CY-Give Augmentin 875 mg # 14 take 1 po BID no refills and Sudafed PE OTC.

## 2013-03-16 NOTE — Telephone Encounter (Signed)
I spoke with pt. She c/o PND, dry cough, slight chest congestion, facial pressure, HA., cheeks hurt, nasal congestion x Sunday. She has taken allegra to help. Please advise Dr. Maple Hudson thanks Last OV 10/09/12 Pending none Allergies  Allergen Reactions  . Shellfish Allergy     throat swelling, hives  . Wheat Bran Itching

## 2013-03-17 ENCOUNTER — Ambulatory Visit: Payer: Medicare Other

## 2013-03-26 ENCOUNTER — Ambulatory Visit (INDEPENDENT_AMBULATORY_CARE_PROVIDER_SITE_OTHER): Payer: Medicare Other

## 2013-03-26 DIAGNOSIS — J309 Allergic rhinitis, unspecified: Secondary | ICD-10-CM

## 2013-04-02 ENCOUNTER — Ambulatory Visit (INDEPENDENT_AMBULATORY_CARE_PROVIDER_SITE_OTHER): Payer: Medicare Other

## 2013-04-02 DIAGNOSIS — J309 Allergic rhinitis, unspecified: Secondary | ICD-10-CM

## 2013-04-06 ENCOUNTER — Telehealth: Payer: Self-pay | Admitting: *Deleted

## 2013-04-06 MED ORDER — FLUCONAZOLE 150 MG PO TABS: 150.0000 mg | ORAL_TABLET | Freq: Once | ORAL | Status: DC

## 2013-04-06 NOTE — Telephone Encounter (Signed)
rx sent, pt informed.  

## 2013-04-06 NOTE — Telephone Encounter (Signed)
Pt has been on amoxicillin given by other physician, pt now has yeast infection itching. OV offered by declined, she is requesting diflucan pills. Please advise

## 2013-04-06 NOTE — Telephone Encounter (Signed)
Diflucan 150 mg x1 okay 

## 2013-04-09 ENCOUNTER — Ambulatory Visit (INDEPENDENT_AMBULATORY_CARE_PROVIDER_SITE_OTHER): Payer: Medicare Other

## 2013-04-09 DIAGNOSIS — J309 Allergic rhinitis, unspecified: Secondary | ICD-10-CM

## 2013-04-16 ENCOUNTER — Encounter: Payer: Self-pay | Admitting: Women's Health

## 2013-04-16 ENCOUNTER — Ambulatory Visit (INDEPENDENT_AMBULATORY_CARE_PROVIDER_SITE_OTHER): Payer: Medicare Other | Admitting: Women's Health

## 2013-04-16 ENCOUNTER — Ambulatory Visit (INDEPENDENT_AMBULATORY_CARE_PROVIDER_SITE_OTHER): Payer: Medicare Other

## 2013-04-16 DIAGNOSIS — B373 Candidiasis of vulva and vagina: Secondary | ICD-10-CM

## 2013-04-16 DIAGNOSIS — N899 Noninflammatory disorder of vagina, unspecified: Secondary | ICD-10-CM

## 2013-04-16 DIAGNOSIS — J309 Allergic rhinitis, unspecified: Secondary | ICD-10-CM

## 2013-04-16 DIAGNOSIS — N898 Other specified noninflammatory disorders of vagina: Secondary | ICD-10-CM

## 2013-04-16 LAB — WET PREP FOR TRICH, YEAST, CLUE
Clue Cells Wet Prep HPF POC: NONE SEEN
Trich, Wet Prep: NONE SEEN

## 2013-04-16 MED ORDER — TERCONAZOLE 0.4 % VA CREA: 1.0000 | TOPICAL_CREAM | Freq: Every day | VAGINAL | Status: DC

## 2013-04-16 NOTE — Progress Notes (Signed)
Patient ID: Migdalia Dk, female   DOB: [birth year], 67 y.o.   MRN: [id number] Presents with complaint of persistent yeast. Was treated with one Diflucan per primary care after antibiotic. States continues with itching and vaginal irritation. Denies urinary symptoms. Postmenopausal/TAH with BSO.  Exam: Appears well. External genitalia erythematous at introitus, speculum exam scant amount of a white discharge wet prep positive for yeast.   Yeast  Plan: Terazol 7 one applicator at bedtime x7, applied externally as well. Instructed to call if no relief of symptoms.

## 2013-04-16 NOTE — Patient Instructions (Addendum)
Monilial Vaginitis  Vaginitis in a soreness, swelling and redness (inflammation) of the vagina and vulva. Monilial vaginitis is not a sexually transmitted infection.  CAUSES   Yeast vaginitis is caused by yeast (candida) that is normally found in your vagina. With a yeast infection, the candida has overgrown in number to a point that upsets the chemical balance.  SYMPTOMS   · White, thick vaginal discharge.  · Swelling, itching, redness and irritation of the vagina and possibly the lips of the vagina (vulva).  · Burning or painful urination.  · Painful intercourse.  DIAGNOSIS   Things that may contribute to monilial vaginitis are:  · Postmenopausal and virginal states.  · Pregnancy.  · Infections.  · Being tired, sick or stressed, especially if you had monilial vaginitis in the past.  · Diabetes. Good control will help lower the chance.  · Birth control pills.  · Tight fitting garments.  · Using bubble bath, feminine sprays, douches or deodorant tampons.  · Taking certain medications that kill germs (antibiotics).  · Sporadic recurrence can occur if you become ill.  TREATMENT   Your caregiver will give you medication.  · There are several kinds of anti monilial vaginal creams and suppositories specific for monilial vaginitis. For recurrent yeast infections, use a suppository or cream in the vagina 2 times a week, or as directed.  · Anti-monilial or steroid cream for the itching or irritation of the vulva may also be used. Get your caregiver's permission.  · Painting the vagina with methylene blue solution may help if the monilial cream does not work.  · Eating yogurt may help prevent monilial vaginitis.  HOME CARE INSTRUCTIONS   · Finish all medication as prescribed.  · Do not have sex until treatment is completed or after your caregiver tells you it is okay.  · Take warm sitz baths.  · Do not douche.  · Do not use tampons, especially scented ones.  · Wear cotton underwear.  · Avoid tight pants and panty  hose.  · Tell your sexual partner that you have a yeast infection. They should go to their caregiver if they have symptoms such as mild rash or itching.  · Your sexual partner should be treated as well if your infection is difficult to eliminate.  · Practice safer sex. Use condoms.  · Some vaginal medications cause latex condoms to fail. Vaginal medications that harm condoms are:  · Cleocin cream.  · Butoconazole (Femstat®).  · Terconazole (Terazol®) vaginal suppository.  · Miconazole (Monistat®) (may be purchased over the counter).  SEEK MEDICAL CARE IF:   · You have a temperature by mouth above 102° F (38.9° C).  · The infection is getting worse after 2 days of treatment.  · The infection is not getting better after 3 days of treatment.  · You develop blisters in or around your vagina.  · You develop vaginal bleeding, and it is not your menstrual period.  · You have pain when you urinate.  · You develop intestinal problems.  · You have pain with sexual intercourse.  Document Released: 04/10/2005 Document Revised: 09/23/2011 Document Reviewed: 12/23/2008  Exit[employer]® Patient Information ©2014 [employer].

## 2013-04-23 ENCOUNTER — Ambulatory Visit (INDEPENDENT_AMBULATORY_CARE_PROVIDER_SITE_OTHER): Payer: Medicare Other

## 2013-04-23 DIAGNOSIS — J309 Allergic rhinitis, unspecified: Secondary | ICD-10-CM

## 2013-04-30 ENCOUNTER — Ambulatory Visit (INDEPENDENT_AMBULATORY_CARE_PROVIDER_SITE_OTHER): Payer: Medicare Other

## 2013-04-30 DIAGNOSIS — J309 Allergic rhinitis, unspecified: Secondary | ICD-10-CM

## 2013-05-05 ENCOUNTER — Ambulatory Visit (INDEPENDENT_AMBULATORY_CARE_PROVIDER_SITE_OTHER): Payer: Medicare Other

## 2013-05-05 DIAGNOSIS — J309 Allergic rhinitis, unspecified: Secondary | ICD-10-CM

## 2013-05-13 ENCOUNTER — Ambulatory Visit: Payer: Medicare Other

## 2013-05-14 ENCOUNTER — Ambulatory Visit (INDEPENDENT_AMBULATORY_CARE_PROVIDER_SITE_OTHER): Payer: Medicare Other

## 2013-05-14 DIAGNOSIS — J309 Allergic rhinitis, unspecified: Secondary | ICD-10-CM

## 2013-05-21 ENCOUNTER — Ambulatory Visit (INDEPENDENT_AMBULATORY_CARE_PROVIDER_SITE_OTHER): Payer: Medicare Other

## 2013-05-21 DIAGNOSIS — J309 Allergic rhinitis, unspecified: Secondary | ICD-10-CM

## 2013-05-28 ENCOUNTER — Ambulatory Visit (INDEPENDENT_AMBULATORY_CARE_PROVIDER_SITE_OTHER): Payer: Medicare Other

## 2013-05-28 DIAGNOSIS — J309 Allergic rhinitis, unspecified: Secondary | ICD-10-CM

## 2013-06-04 ENCOUNTER — Ambulatory Visit (INDEPENDENT_AMBULATORY_CARE_PROVIDER_SITE_OTHER): Payer: Medicare Other

## 2013-06-04 DIAGNOSIS — J309 Allergic rhinitis, unspecified: Secondary | ICD-10-CM

## 2013-06-11 ENCOUNTER — Ambulatory Visit (INDEPENDENT_AMBULATORY_CARE_PROVIDER_SITE_OTHER): Payer: Medicare Other

## 2013-06-11 DIAGNOSIS — J309 Allergic rhinitis, unspecified: Secondary | ICD-10-CM

## 2013-06-16 ENCOUNTER — Encounter: Payer: Self-pay | Admitting: Internal Medicine

## 2013-06-18 ENCOUNTER — Ambulatory Visit (INDEPENDENT_AMBULATORY_CARE_PROVIDER_SITE_OTHER): Payer: Medicare Other

## 2013-06-18 DIAGNOSIS — J309 Allergic rhinitis, unspecified: Secondary | ICD-10-CM

## 2013-06-21 ENCOUNTER — Ambulatory Visit (INDEPENDENT_AMBULATORY_CARE_PROVIDER_SITE_OTHER): Payer: Medicare Other

## 2013-06-21 DIAGNOSIS — J309 Allergic rhinitis, unspecified: Secondary | ICD-10-CM

## 2013-06-25 ENCOUNTER — Ambulatory Visit (INDEPENDENT_AMBULATORY_CARE_PROVIDER_SITE_OTHER): Payer: Medicare Other

## 2013-06-25 DIAGNOSIS — J309 Allergic rhinitis, unspecified: Secondary | ICD-10-CM

## 2013-06-29 ENCOUNTER — Telehealth: Payer: Self-pay | Admitting: *Deleted

## 2013-06-29 NOTE — Telephone Encounter (Signed)
Prior authorization faxed to [facility] for estradiol 0.5 mg, will wait for response.

## 2013-06-30 NOTE — Telephone Encounter (Signed)
Medication does not require PA at this time per [facility]

## 2013-07-02 ENCOUNTER — Ambulatory Visit (INDEPENDENT_AMBULATORY_CARE_PROVIDER_SITE_OTHER): Payer: Medicare Other

## 2013-07-02 DIAGNOSIS — J309 Allergic rhinitis, unspecified: Secondary | ICD-10-CM

## 2013-07-09 ENCOUNTER — Ambulatory Visit (INDEPENDENT_AMBULATORY_CARE_PROVIDER_SITE_OTHER): Payer: Medicare Other

## 2013-07-09 DIAGNOSIS — J309 Allergic rhinitis, unspecified: Secondary | ICD-10-CM

## 2013-07-14 ENCOUNTER — Ambulatory Visit: Payer: Medicare Other

## 2013-07-21 ENCOUNTER — Ambulatory Visit (INDEPENDENT_AMBULATORY_CARE_PROVIDER_SITE_OTHER): Payer: Medicare Other

## 2013-07-21 DIAGNOSIS — J309 Allergic rhinitis, unspecified: Secondary | ICD-10-CM

## 2013-07-22 ENCOUNTER — Encounter: Payer: Self-pay | Admitting: Internal Medicine

## 2013-07-30 ENCOUNTER — Ambulatory Visit (INDEPENDENT_AMBULATORY_CARE_PROVIDER_SITE_OTHER): Payer: Medicare Other

## 2013-07-30 DIAGNOSIS — J309 Allergic rhinitis, unspecified: Secondary | ICD-10-CM

## 2013-08-02 ENCOUNTER — Ambulatory Visit: Payer: Medicare Other

## 2013-08-06 ENCOUNTER — Ambulatory Visit (INDEPENDENT_AMBULATORY_CARE_PROVIDER_SITE_OTHER): Payer: Medicare Other

## 2013-08-06 DIAGNOSIS — J309 Allergic rhinitis, unspecified: Secondary | ICD-10-CM

## 2013-08-11 ENCOUNTER — Encounter: Payer: Medicare Other | Admitting: Gynecology

## 2013-08-13 ENCOUNTER — Ambulatory Visit (INDEPENDENT_AMBULATORY_CARE_PROVIDER_SITE_OTHER): Payer: Medicare Other

## 2013-08-13 DIAGNOSIS — J309 Allergic rhinitis, unspecified: Secondary | ICD-10-CM

## 2013-08-20 ENCOUNTER — Ambulatory Visit (INDEPENDENT_AMBULATORY_CARE_PROVIDER_SITE_OTHER): Payer: Medicare Other

## 2013-08-20 DIAGNOSIS — J309 Allergic rhinitis, unspecified: Secondary | ICD-10-CM

## 2013-08-27 ENCOUNTER — Telehealth: Payer: Self-pay | Admitting: Internal Medicine

## 2013-08-27 ENCOUNTER — Ambulatory Visit: Payer: Medicare Other

## 2013-08-27 MED ORDER — AMOXICILLIN-POT CLAVULANATE 875-125 MG PO TABS: 1.0000 | ORAL_TABLET | Freq: Two times a day (BID) | ORAL | Status: DC

## 2013-08-27 NOTE — Telephone Encounter (Signed)
Called and spoke with pt and she stated that she has been having headache and sinus pressure x 2 days.  Her head feels full with clear congestion and pain behind both of her ears.  Pt has been using the sudafed Pe.   Pt denies any fever or body aches.  CY please advise. Thanks  Last ov--10/09/2012 Next ov--no pending appts   Allergies  Allergen Reactions  . Shellfish Allergy     throat swelling, hives  . Wheat Bran Itching     Current Outpatient Prescriptions on File Prior to Visit  Medication Sig Dispense Refill  . acetaminophen (TYLENOL) 325 MG tablet Take 650 mg by mouth every 6 (six) hours as needed.        Marland Kitchen. estradiol (ESTRACE) 0.5 MG tablet Take 1 tablet (0.5 mg total) by mouth daily.  30 tablet  11  . fexofenadine (ALLEGRA) 180 MG tablet Take 180 mg by mouth daily.        Marland Kitchen. HYDROCHLOROTHIAZIDE PO Take by mouth.      Marland Kitchen. omeprazole (PRILOSEC) 40 MG capsule Take 40 mg by mouth daily as needed.        Marland Kitchen. PROAIR HFA 108 (90 BASE) MCG/ACT inhaler USE 2 PUFFS BY MOUTH EVERY 6 HOURS AS NEEDED FOR WHEEZING AND SHORTNESS OF BREATH  8.5 each  3  . terconazole (TERAZOL 7) 0.4 % vaginal cream Place 1 applicator vaginally at bedtime.  45 g  1   No current facility-administered medications on file prior to visit.

## 2013-08-27 NOTE — Telephone Encounter (Signed)
Offer augmentin 875, # 20, 1 twice daily 

## 2013-08-27 NOTE — Telephone Encounter (Signed)
Spoke with pt. Aware of recs. rx called in. Nothing further needed 

## 2013-09-03 ENCOUNTER — Encounter: Payer: Self-pay | Admitting: Gynecology

## 2013-09-03 ENCOUNTER — Ambulatory Visit (INDEPENDENT_AMBULATORY_CARE_PROVIDER_SITE_OTHER): Payer: Medicare Other

## 2013-09-03 DIAGNOSIS — J309 Allergic rhinitis, unspecified: Secondary | ICD-10-CM

## 2013-09-10 ENCOUNTER — Ambulatory Visit: Payer: Medicare Other

## 2013-09-17 ENCOUNTER — Other Ambulatory Visit: Payer: Self-pay | Admitting: Gynecology

## 2013-09-17 ENCOUNTER — Ambulatory Visit (INDEPENDENT_AMBULATORY_CARE_PROVIDER_SITE_OTHER): Payer: Medicare Other

## 2013-09-17 DIAGNOSIS — J309 Allergic rhinitis, unspecified: Secondary | ICD-10-CM

## 2013-09-18 ENCOUNTER — Encounter (HOSPITAL_COMMUNITY): Payer: Self-pay | Admitting: Emergency Medicine

## 2013-09-18 ENCOUNTER — Emergency Department (INDEPENDENT_AMBULATORY_CARE_PROVIDER_SITE_OTHER)
Admission: EM | Admit: 2013-09-18 | Discharge: 2013-09-18 | Disposition: A | Discharge status: Home / Self Care | Payer: Medicare Other | Source: Home / Self Care | Attending: Family Medicine | Admitting: Family Medicine

## 2013-09-18 DIAGNOSIS — R42 Dizziness and giddiness: Secondary | ICD-10-CM

## 2013-09-18 DIAGNOSIS — J019 Acute sinusitis, unspecified: Secondary | ICD-10-CM

## 2013-09-18 MED ORDER — ONDANSETRON 8 MG PO TBDP: 8.0000 mg | ORAL_TABLET | Freq: Three times a day (TID) | ORAL | Status: DC | PRN

## 2013-09-18 MED ORDER — METHYLPREDNISOLONE 4 MG PO KIT: PACK | ORAL | Status: DC

## 2013-09-18 MED ORDER — MINOCYCLINE HCL 100 MG PO CAPS: 100.0000 mg | ORAL_CAPSULE | Freq: Two times a day (BID) | ORAL | Status: DC

## 2013-09-18 MED ORDER — FLUTICASONE PROPIONATE 50 MCG/ACT NA SUSP: 2.0000 | Freq: Two times a day (BID) | NASAL | Status: DC

## 2013-09-18 MED ORDER — MECLIZINE HCL 25 MG PO TABS: 25.0000 mg | ORAL_TABLET | Freq: Four times a day (QID) | ORAL | Status: DC | PRN

## 2013-09-18 NOTE — ED Notes (Signed)
Report waking this a.m feeling dizzy.  "room spinning".   Vomiting.   Sinus congestion.  No otc meds taken.

## 2013-09-18 NOTE — ED Provider Notes (Signed)
CSN: [id number]     Arrival date & time 09/18/13  1103 History   First MD Initiated Contact with Patient 09/18/13 1253     Chief Complaint  Patient presents with  . Dizziness  . Emesis   (Consider location/radiation/quality/duration/timing/severity/associated sxs/prior Treatment) HPI Comments: 68 year old female presents for evaluation of vomiting and dizziness since this morning. This morning, she had acute onset of feeling the room spinning around when she sat up out of bed. This caused her to feel nauseous and eventually vomit. She has vomited a few times today because of this problem. She has never had this problem before. She admits to a recent history of sinusitis, treated with Augmentin, she did not finish her antibiotics. She has been having a lot of sinus pressure for the last 2 days. Denies peripheral numbness or weakness   Patient is a 68 y.o. female presenting with dizziness and vomiting.  Dizziness Associated symptoms: nausea and vomiting   Associated symptoms: no blood in stool, no chest pain, no diarrhea, no palpitations and no shortness of breath   Emesis Associated symptoms: sore throat   Associated symptoms: no abdominal pain, no arthralgias, no chills, no diarrhea and no myalgias     Past Medical History  Diagnosis Date  . Other hyperalimentation   . Urticaria   . Other chronic sinusitis   . Allergic rhinitis, cause unspecified   . Unspecified asthma(493.90)   . Chronic airway obstruction, not elsewhere classified   . GERD (gastroesophageal reflux disease)   . Hypertension   . Acid reflux    Past Surgical History  Procedure Laterality Date  . Total abdominal hysterectomy w/ bilateral salpingoophorectomy    . Cesarean section    . Eye surgery      DETACHED RETINA RIGHT EYE...   . Cataract extraction      RIGHT EYE  . Abdominal hysterectomy  1991    ABDOMINAL HYSTERECTOMY  leiomyomata   Family History  Problem Relation Age of Onset  . Heart failure Mother    . Heart disease Mother   . Pancreatic cancer Father   . Cancer Father 6    PANCREATIC (DECEASED)  . Diabetes Brother     DEACESED.Marland Kitchen HAD A KIDNEY TRANSPLANT  . Stroke Brother 63    DECEASED  . Breast cancer Sister 38   History  Substance Use Topics  . Smoking status: Never Smoker   . Smokeless tobacco: Never Used  . Alcohol Use: No   OB History   Grav Para Term Preterm Abortions TAB SAB Ect Mult Living   2 2 2       2      Review of Systems  Constitutional: Negative for fever and chills.  HENT: Positive for congestion, postnasal drip, rhinorrhea, sinus pressure and sore throat. Negative for ear pain.   Eyes: Negative for visual disturbance.  Respiratory: Negative for cough and shortness of breath.   Cardiovascular: Negative for chest pain, palpitations and leg swelling.  Gastrointestinal: Positive for nausea and vomiting. Negative for abdominal pain, diarrhea, constipation and blood in stool.  Endocrine: Negative for polydipsia and polyuria.  Genitourinary: Negative for dysuria, urgency and frequency.  Musculoskeletal: Negative for arthralgias and myalgias.  Skin: Negative for rash.  Neurological: Positive for dizziness (vertigo). Negative for weakness and light-headedness.    Allergies  Shellfish allergy and Wheat bran  Home Medications   Current Outpatient Rx  Name  Route  Sig  Dispense  Refill  . estradiol (ESTRACE) 0.5 MG tablet  Oral   Take 1 tablet (0.5 mg total) by mouth daily.   30 tablet   11   . fexofenadine (ALLEGRA) 180 MG tablet   Oral   Take 180 mg by mouth daily.           Marland Kitchen. HYDROCHLOROTHIAZIDE PO   Oral   Take by mouth.         Marland Kitchen. omeprazole (PRILOSEC) 40 MG capsule   Oral   Take 40 mg by mouth daily as needed.           Marland Kitchen. PROAIR HFA 108 (90 BASE) MCG/ACT inhaler      USE 2 PUFFS BY MOUTH EVERY 6 HOURS AS NEEDED FOR WHEEZING AND SHORTNESS OF BREATH   8.5 each   3   . terconazole (TERAZOL 7) 0.4 % vaginal cream   Vaginal    Place 1 applicator vaginally at bedtime.   45 g   1   . acetaminophen (TYLENOL) 325 MG tablet   Oral   Take 650 mg by mouth every 6 (six) hours as needed.           Marland Kitchen. amoxicillin-clavulanate (AUGMENTIN) 875-125 MG per tablet   Oral   Take 1 tablet by mouth 2 (two) times daily.   20 tablet   0   . fluticasone (FLONASE) 50 MCG/ACT nasal spray   Each Nare   Place 2 sprays into both nostrils 2 (two) times daily.   16 g   2   . meclizine (ANTIVERT) 25 MG tablet   Oral   Take 1 tablet (25 mg total) by mouth 4 (four) times daily as needed for dizziness.   20 tablet   0   . methylPREDNISolone (MEDROL DOSEPAK) 4 MG tablet      follow package directions   21 tablet   0     Dispense as written.   . minocycline (MINOCIN) 100 MG capsule   Oral   Take 1 capsule (100 mg total) by mouth 2 (two) times daily.   20 capsule   0   . ondansetron (ZOFRAN ODT) 8 MG disintegrating tablet   Oral   Take 1 tablet (8 mg total) by mouth every 8 (eight) hours as needed for nausea or vomiting.   10 tablet   0    BP 154/86  Pulse 92  Temp(Src) 97.8 F (36.6 C) (Oral)  Resp 21  SpO2 100%  LMP 05/10/1990 Physical Exam  Nursing note and vitals reviewed. Constitutional: She is oriented to person, place, and time. Vital signs are normal. She appears well-developed and well-nourished. No distress.  HENT:  Head: Normocephalic and atraumatic.  Right Ear: Tympanic membrane, external ear and ear canal normal.  Left Ear: Tympanic membrane, external ear and ear canal normal.  Nose: Mucosal edema and rhinorrhea present. Right sinus exhibits maxillary sinus tenderness. Right sinus exhibits no frontal sinus tenderness. Left sinus exhibits maxillary sinus tenderness. Left sinus exhibits no frontal sinus tenderness.  Mouth/Throat: Uvula is midline and mucous membranes are normal. Posterior oropharyngeal erythema present.  Cardiovascular: Normal rate, regular rhythm and normal heart sounds.  Exam  reveals no gallop and no friction rub.   No murmur heard. Pulmonary/Chest: Effort normal and breath sounds normal. No respiratory distress. She has no wheezes. She has no rales.  Lymphadenopathy:       Head (right side): Tonsillar adenopathy present.       Head (left side): Tonsillar adenopathy present.    She has cervical adenopathy.  Right cervical: Posterior cervical adenopathy present.       Left cervical: Posterior cervical adenopathy present.  Neurological: She is alert and oriented to person, place, and time. She has normal strength and normal reflexes. No cranial nerve deficit or sensory deficit. She exhibits normal muscle tone. Coordination normal. GCS eye subscore is 4. GCS verbal subscore is 5. GCS motor subscore is 6.  Skin: Skin is warm and dry. No rash noted. She is not diaphoretic.  Psychiatric: She has a normal mood and affect. Judgment normal.    ED Course  Procedures (including critical care time) Labs Review Labs Reviewed - No data to display Imaging Review No results found.   MDM   1. SINUSITIS, ACUTE   2. Vertigo    Treating with meclizine for vertigo, treating sinusitis as well.   Meds ordered this encounter  Medications  . minocycline (MINOCIN) 100 MG capsule    Sig: Take 1 capsule (100 mg total) by mouth 2 (two) times daily.    Dispense:  20 capsule    Refill:  0    Order Specific Question:  Supervising Provider    Answer:  Linna Hoff [phone]  . methylPREDNISolone (MEDROL DOSEPAK) 4 MG tablet    Sig: follow package directions    Dispense:  21 tablet    Refill:  0    Order Specific Question:  Supervising Provider    Answer:  Linna Hoff [phone]  . fluticasone (FLONASE) 50 MCG/ACT nasal spray    Sig: Place 2 sprays into both nostrils 2 (two) times daily.    Dispense:  16 g    Refill:  2    Order Specific Question:  Supervising Provider    Answer:  Bradd Canary D [id number]  . meclizine (ANTIVERT) 25 MG tablet    Sig: Take 1 tablet (25 mg  total) by mouth 4 (four) times daily as needed for dizziness.    Dispense:  20 tablet    Refill:  0    Order Specific Question:  Supervising Provider    Answer:  Linna Hoff [phone]  . ondansetron (ZOFRAN ODT) 8 MG disintegrating tablet    Sig: Take 1 tablet (8 mg total) by mouth every 8 (eight) hours as needed for nausea or vomiting.    Dispense:  10 tablet    Refill:  0    Order Specific Question:  Supervising Provider    Answer:  Bradd Canary D [[id number]]       Graylon Good, PA-C 09/18/13 1323

## 2013-09-18 NOTE — ED Provider Notes (Signed)
Medical screening examination/treatment/procedure(s) were performed by resident physician or non-physician practitioner and as supervising physician I was immediately available for consultation/collaboration.   Barkley BrunsKINDL,JAMES DOUGLAS MD.   Linna HoffJames D Kindl, MD 09/18/13 1452

## 2013-09-18 NOTE — Discharge Instructions (Signed)

## 2013-09-23 ENCOUNTER — Telehealth: Payer: Self-pay | Admitting: Internal Medicine

## 2013-09-23 NOTE — Telephone Encounter (Signed)
Pt went to the ED and was diagnosed with acute sinusitis and vertigo. She was prescribed abx. Pt wanted to know if she should continue the medication. I advised she needs to continue meds as prescribed. Carron CurieJennifer Castillo, CMA

## 2013-09-24 ENCOUNTER — Ambulatory Visit (INDEPENDENT_AMBULATORY_CARE_PROVIDER_SITE_OTHER): Payer: Medicare Other

## 2013-09-24 DIAGNOSIS — J309 Allergic rhinitis, unspecified: Secondary | ICD-10-CM

## 2013-09-27 ENCOUNTER — Ambulatory Visit (INDEPENDENT_AMBULATORY_CARE_PROVIDER_SITE_OTHER): Payer: Medicare Other | Admitting: Gynecology

## 2013-09-27 ENCOUNTER — Encounter: Payer: Self-pay | Admitting: Gynecology

## 2013-09-27 VITALS — BP 140/90 | Ht 62.0 in | Wt 213.0 lb

## 2013-09-27 DIAGNOSIS — N952 Postmenopausal atrophic vaginitis: Secondary | ICD-10-CM

## 2013-09-27 DIAGNOSIS — L293 Anogenital pruritus, unspecified: Secondary | ICD-10-CM

## 2013-09-27 DIAGNOSIS — Z79890 Hormone replacement therapy (postmenopausal): Secondary | ICD-10-CM

## 2013-09-27 DIAGNOSIS — L292 Pruritus vulvae: Secondary | ICD-10-CM

## 2013-09-27 MED ORDER — NYSTATIN-TRIAMCINOLONE 100000-0.1 UNIT/GM-% EX OINT: 1.0000 "application " | TOPICAL_OINTMENT | Freq: Two times a day (BID) | CUTANEOUS | Status: DC

## 2013-09-27 MED ORDER — ESTRADIOL 0.5 MG PO TABS: ORAL_TABLET | ORAL | Status: DC

## 2013-09-27 NOTE — Progress Notes (Signed)
Bonnie Ellison 03-Dec-1945 [id number]        68 y.o.  G2P2002 for followup exam.  Several issues noted below.  Past medical history,surgical history, problem list, medications, allergies, family history and social history were all reviewed and documented in the EPIC chart.  ROS:  Performed and pertinent positives and negatives are included in the history, assessment and plan .  Exam: Kim assistant Filed Vitals:   09/27/13 1125  BP: 140/90  Height: 5\' 2"  (1.575 m)  Weight: 213 lb (96.616 kg)   General appearance  Normal Skin grossly normal Head/Neck normal with no cervical or supraclavicular adenopathy thyroid normal Lungs  clear Cardiac RR, without RMG Abdominal  soft, nontender, without masses, organomegaly or hernia Breasts  examined lying and sitting without masses, retractions, discharge or axillary adenopathy. Pelvic  Ext/BUS/vagina generalized atrophic changes.  Adnexa  Without masses or tenderness    Anus and perineum  Normal   Rectovaginal  Normal sphincter tone without palpated masses or tenderness.    Assessment/Plan:  68 y.o. 582P2002 female for annual exam.   1. Postmenopausal/atrophic genital changes/HRT. Patient on estradiol 0.5 mg daily.  Doing well wants to continue.  I reviewed the whole issue of HRT with her to include the WHI study with increased risk of stroke, heart attack, DVT and breast cancer. The ACOG and NAMS statements for lowest dose for the shortest period of time reviewed. Transdermal versus oral first-pass effect benefit discussed.  The issue of weaning and when to do this all reviewed with her. After lengthy discussion she wants to continue and accepts the risks. Estradiol 0.5 mg refill x1 year 2. Intermittent/occasional vulvar itching. Exam is normal. Suspect intermittent low-grade fungal. Mytrex cream provided with one refill to use when necessary. Followup if symptoms persist, worsen or are not resolving with the Mytrex. 3. Pap smear 2011. No Pap  smear done today. No history of abnormal Pap smears previously and status post TAH for leiomyoma. We previously reviewed current screening guidelines and she elects to stop screening which I think is reasonable. 4. Mammography due now. Patient reminded to schedule and she agrees to do so. SBE monthly reviewed. 5. Colonoscopy due this year and she knows to schedule and I reminded her to do so. 6. Health maintenance. No blood work done as this is all done through her primary physician's office. Blood pressure 140/90 given to her. She is being followed for hypertension and admits to just recently taking her blood pressure medicine. We'll continue to monitor at home. Followup with primary in reference to this. Follow up with me in one year, sooner if any issues.   Note: This document was prepared with digital dictation and possible smart phrase technology. Any transcriptional errors that result from this process are unintentional.   Dara LordsFONTAINE,TIMOTHY P MD, 12:01 PM 09/27/2013

## 2013-09-27 NOTE — Patient Instructions (Signed)
Followup in one year, sooner as needed. Schedule your mammogram.  Health Maintenance, Female A healthy lifestyle and preventative care can promote health and wellness.  Maintain regular health, dental, and eye exams.  Eat a healthy diet. Foods like vegetables, fruits, whole grains, low-fat dairy products, and lean protein foods contain the nutrients you need without too many calories. Decrease your intake of foods high in solid fats, added sugars, and salt. Get information about a proper diet from your caregiver, if necessary.  Regular physical exercise is one of the most important things you can do for your health. Most adults should get at least 150 minutes of moderate-intensity exercise (any activity that increases your heart rate and causes you to sweat) each week. In addition, most adults need muscle-strengthening exercises on 2 or more days a week.   Maintain a healthy weight. The body mass index (BMI) is a screening tool to identify possible weight problems. It provides an estimate of body fat based on height and weight. Your caregiver can help determine your BMI, and can help you achieve or maintain a healthy weight. For adults 20 years and older:  A BMI below 18.5 is considered underweight.  A BMI of 18.5 to 24.9 is normal.  A BMI of 25 to 29.9 is considered overweight.  A BMI of 30 and above is considered obese.  Maintain normal blood lipids and cholesterol by exercising and minimizing your intake of saturated fat. Eat a balanced diet with plenty of fruits and vegetables. Blood tests for lipids and cholesterol should begin at age 61 and be repeated every 5 years. If your lipid or cholesterol levels are high, you are over 50, or you are a high risk for heart disease, you may need your cholesterol levels checked more frequently.Ongoing high lipid and cholesterol levels should be treated with medicines if diet and exercise are not effective.  If you smoke, find out from your  caregiver how to quit. If you do not use tobacco, do not start.  Lung cancer screening is recommended for adults aged 49 80 years who are at high risk for developing lung cancer because of a history of smoking. Yearly low-dose computed tomography (CT) is recommended for people who have at least a 30-pack-year history of smoking and are a current smoker or have quit within the past 15 years. A pack year of smoking is smoking an average of 1 pack of cigarettes a day for 1 year (for example: 1 pack a day for 30 years or 2 packs a day for 15 years). Yearly screening should continue until the smoker has stopped smoking for at least 15 years. Yearly screening should also be stopped for people who develop a health problem that would prevent them from having lung cancer treatment.  If you are pregnant, do not drink alcohol. If you are breastfeeding, be very cautious about drinking alcohol. If you are not pregnant and choose to drink alcohol, do not exceed 1 drink per day. One drink is considered to be 12 ounces (355 mL) of beer, 5 ounces (148 mL) of wine, or 1.5 ounces (44 mL) of liquor.  Avoid use of street drugs. Do not share needles with anyone. Ask for help if you need support or instructions about stopping the use of drugs.  High blood pressure causes heart disease and increases the risk of stroke. Blood pressure should be checked at least every 1 to 2 years. Ongoing high blood pressure should be treated with medicines, if weight loss  and exercise are not effective.  If you are 30 to 67 years old, ask your caregiver if you should take aspirin to prevent strokes.  Diabetes screening involves taking a blood sample to check your fasting blood sugar level. This should be done once every 3 years, after age 68, if you are within normal weight and without risk factors for diabetes. Testing should be considered at a younger age or be carried out more frequently if you are overweight and have at least 1 risk factor  for diabetes.  Breast cancer screening is essential preventative care for women. You should practice "breast self-awareness." This means understanding the normal appearance and feel of your breasts and may include breast self-examination. Any changes detected, no matter how small, should be reported to a caregiver. Women in their 67s and 30s should have a clinical breast exam (CBE) by a caregiver as part of a regular health exam every 1 to 3 years. After age 57, women should have a CBE every year. Starting at age 94, women should consider having a mammogram (breast X-ray) every year. Women who have a family history of breast cancer should talk to their caregiver about genetic screening. Women at a high risk of breast cancer should talk to their caregiver about having an MRI and a mammogram every year.  Breast cancer gene (BRCA)-related cancer risk assessment is recommended for women who have family members with BRCA-related cancers. BRCA-related cancers include breast, ovarian, tubal, and peritoneal cancers. Having family members with these cancers may be associated with an increased risk for harmful changes (mutations) in the breast cancer genes BRCA1 and BRCA2. Results of the assessment will determine the need for genetic counseling and BRCA1 and BRCA2 testing.  The Pap test is a screening test for cervical cancer. Women should have a Pap test starting at age 46. Between ages 1 and 59, Pap tests should be repeated every 2 years. Beginning at age 7, you should have a Pap test every 3 years as long as the past 3 Pap tests have been normal. If you had a hysterectomy for a problem that was not cancer or a condition that could lead to cancer, then you no longer need Pap tests. If you are between ages 52 and 53, and you have had normal Pap tests going back 10 years, you no longer need Pap tests. If you have had past treatment for cervical cancer or a condition that could lead to cancer, you need Pap tests and  screening for cancer for at least 20 years after your treatment. If Pap tests have been discontinued, risk factors (such as a new sexual partner) need to be reassessed to determine if screening should be resumed. Some women have medical problems that increase the chance of getting cervical cancer. In these cases, your caregiver may recommend more frequent screening and Pap tests.  The human papillomavirus (HPV) test is an additional test that may be used for cervical cancer screening. The HPV test looks for the virus that can cause the cell changes on the cervix. The cells collected during the Pap test can be tested for HPV. The HPV test could be used to screen women aged 70 years and older, and should be used in women of any age who have unclear Pap test results. After the age of 50, women should have HPV testing at the same frequency as a Pap test.  Colorectal cancer can be detected and often prevented. Most routine colorectal cancer screening begins at the  age of 72 and continues through age 75. However, your caregiver may recommend screening at an earlier age if you have risk factors for colon cancer. On a yearly basis, your caregiver may provide home test kits to check for hidden blood in the stool. Use of a small camera at the end of a tube, to directly examine the colon (sigmoidoscopy or colonoscopy), can detect the earliest forms of colorectal cancer. Talk to your caregiver about this at age 8, when routine screening begins. Direct examination of the colon should be repeated every 5 to 10 years through age 45, unless early forms of pre-cancerous polyps or small growths are found.  Hepatitis C blood testing is recommended for all people born from [birth year] through 1964 and any individual with known risks for hepatitis C.  Practice safe sex. Use condoms and avoid high-risk sexual practices to reduce the spread of sexually transmitted infections (STIs). Sexually active women aged 8 and younger should be  checked for Chlamydia, which is a common sexually transmitted infection. Older women with new or multiple partners should also be tested for Chlamydia. Testing for other STIs is recommended if you are sexually active and at increased risk.  Osteoporosis is a disease in which the bones lose minerals and strength with aging. This can result in serious bone fractures. The risk of osteoporosis can be identified using a bone density scan. Women ages 66 and over and women at risk for fractures or osteoporosis should discuss screening with their caregivers. Ask your caregiver whether you should be taking a calcium supplement or vitamin D to reduce the rate of osteoporosis.  Menopause can be associated with physical symptoms and risks. Hormone replacement therapy is available to decrease symptoms and risks. You should talk to your caregiver about whether hormone replacement therapy is right for you.  Use sunscreen. Apply sunscreen liberally and repeatedly throughout the day. You should seek shade when your shadow is shorter than you. Protect yourself by wearing long sleeves, pants, a wide-brimmed hat, and sunglasses year round, whenever you are outdoors.  Notify your caregiver of new moles or changes in moles, especially if there is a change in shape or color. Also notify your caregiver if a mole is larger than the size of a pencil eraser.  Stay current with your immunizations. Document Released: 01/14/2011 Document Revised: 10/26/2012 Document Reviewed: 01/14/2011 [facility] Patient Information 2014 [place].

## 2013-09-28 ENCOUNTER — Ambulatory Visit: Payer: Medicare Other

## 2013-09-28 LAB — URINALYSIS W MICROSCOPIC + REFLEX CULTURE
Bacteria, UA: NONE SEEN
Bilirubin Urine: NEGATIVE
Casts: NONE SEEN
Crystals: NONE SEEN
Glucose, UA: NEGATIVE mg/dL
Hgb urine dipstick: NEGATIVE
Ketones, ur: NEGATIVE mg/dL
Leukocytes, UA: NEGATIVE
Nitrite: NEGATIVE
Protein, ur: NEGATIVE mg/dL
Specific Gravity, Urine: 1.007 (ref 1.005–1.030)
Urobilinogen, UA: 0.2 mg/dL (ref 0.0–1.0)
pH: 7 (ref 5.0–8.0)

## 2013-10-01 ENCOUNTER — Ambulatory Visit (INDEPENDENT_AMBULATORY_CARE_PROVIDER_SITE_OTHER): Payer: Medicare Other

## 2013-10-01 DIAGNOSIS — J309 Allergic rhinitis, unspecified: Secondary | ICD-10-CM

## 2013-10-08 ENCOUNTER — Encounter: Payer: Self-pay | Admitting: Internal Medicine

## 2013-10-08 ENCOUNTER — Ambulatory Visit (INDEPENDENT_AMBULATORY_CARE_PROVIDER_SITE_OTHER): Payer: Medicare Other

## 2013-10-08 DIAGNOSIS — J309 Allergic rhinitis, unspecified: Secondary | ICD-10-CM

## 2013-10-14 ENCOUNTER — Ambulatory Visit: Payer: Medicare Other

## 2013-10-18 ENCOUNTER — Telehealth: Payer: Self-pay | Admitting: *Deleted

## 2013-10-18 NOTE — Telephone Encounter (Signed)
Prior authorization form filled out and faxed to [facility] for estradiol 0.5 mg tablet. Will wait for response.

## 2013-10-19 NOTE — Telephone Encounter (Signed)
Estradiol 0.5 mg approved through 10/18/2014

## 2013-10-21 ENCOUNTER — Ambulatory Visit (INDEPENDENT_AMBULATORY_CARE_PROVIDER_SITE_OTHER): Payer: Medicare Other

## 2013-10-21 DIAGNOSIS — J309 Allergic rhinitis, unspecified: Secondary | ICD-10-CM

## 2013-10-29 ENCOUNTER — Ambulatory Visit (INDEPENDENT_AMBULATORY_CARE_PROVIDER_SITE_OTHER): Payer: Medicare Other

## 2013-10-29 DIAGNOSIS — J309 Allergic rhinitis, unspecified: Secondary | ICD-10-CM

## 2013-11-03 ENCOUNTER — Ambulatory Visit: Payer: Medicare Other

## 2013-11-05 ENCOUNTER — Ambulatory Visit (INDEPENDENT_AMBULATORY_CARE_PROVIDER_SITE_OTHER): Payer: Medicare Other

## 2013-11-05 DIAGNOSIS — J309 Allergic rhinitis, unspecified: Secondary | ICD-10-CM

## 2013-11-12 ENCOUNTER — Ambulatory Visit (INDEPENDENT_AMBULATORY_CARE_PROVIDER_SITE_OTHER): Payer: Medicare Other

## 2013-11-12 DIAGNOSIS — J309 Allergic rhinitis, unspecified: Secondary | ICD-10-CM

## 2013-11-19 ENCOUNTER — Ambulatory Visit (INDEPENDENT_AMBULATORY_CARE_PROVIDER_SITE_OTHER): Payer: Medicare Other

## 2013-11-19 DIAGNOSIS — J309 Allergic rhinitis, unspecified: Secondary | ICD-10-CM

## 2013-11-26 ENCOUNTER — Ambulatory Visit (INDEPENDENT_AMBULATORY_CARE_PROVIDER_SITE_OTHER): Payer: Medicare Other

## 2013-11-26 DIAGNOSIS — J309 Allergic rhinitis, unspecified: Secondary | ICD-10-CM

## 2013-12-02 ENCOUNTER — Ambulatory Visit: Payer: Medicare Other

## 2013-12-03 ENCOUNTER — Ambulatory Visit (INDEPENDENT_AMBULATORY_CARE_PROVIDER_SITE_OTHER): Payer: Medicare Other

## 2013-12-03 DIAGNOSIS — J309 Allergic rhinitis, unspecified: Secondary | ICD-10-CM

## 2013-12-07 ENCOUNTER — Other Ambulatory Visit: Payer: Self-pay

## 2013-12-07 DIAGNOSIS — Z1231 Encounter for screening mammogram for malignant neoplasm of breast: Secondary | ICD-10-CM

## 2013-12-10 ENCOUNTER — Ambulatory Visit (INDEPENDENT_AMBULATORY_CARE_PROVIDER_SITE_OTHER): Payer: Medicare Other

## 2013-12-10 DIAGNOSIS — J309 Allergic rhinitis, unspecified: Secondary | ICD-10-CM

## 2013-12-14 ENCOUNTER — Encounter: Payer: Self-pay | Admitting: Internal Medicine

## 2013-12-14 ENCOUNTER — Ambulatory Visit: Payer: Medicare Other

## 2013-12-17 ENCOUNTER — Ambulatory Visit (INDEPENDENT_AMBULATORY_CARE_PROVIDER_SITE_OTHER): Payer: Medicare Other

## 2013-12-17 DIAGNOSIS — J309 Allergic rhinitis, unspecified: Secondary | ICD-10-CM

## 2013-12-20 ENCOUNTER — Ambulatory Visit
Admission: RE | Admit: 2013-12-20 | Discharge: 2013-12-20 | Disposition: A | Discharge status: Home / Self Care | Payer: Medicare Other | Source: Ambulatory Visit

## 2013-12-20 DIAGNOSIS — Z1231 Encounter for screening mammogram for malignant neoplasm of breast: Secondary | ICD-10-CM

## 2013-12-21 ENCOUNTER — Other Ambulatory Visit: Payer: Self-pay | Admitting: Gynecology

## 2013-12-24 ENCOUNTER — Ambulatory Visit (INDEPENDENT_AMBULATORY_CARE_PROVIDER_SITE_OTHER): Payer: Medicare Other

## 2013-12-24 DIAGNOSIS — J309 Allergic rhinitis, unspecified: Secondary | ICD-10-CM

## 2013-12-31 ENCOUNTER — Ambulatory Visit (INDEPENDENT_AMBULATORY_CARE_PROVIDER_SITE_OTHER): Payer: Medicare Other

## 2013-12-31 DIAGNOSIS — J309 Allergic rhinitis, unspecified: Secondary | ICD-10-CM

## 2014-01-03 ENCOUNTER — Emergency Department (HOSPITAL_COMMUNITY)
Admission: EM | Admit: 2014-01-03 | Discharge: 2014-01-03 | Disposition: A | Discharge status: Home / Self Care | Payer: Medicare Other | Attending: Emergency Medicine | Admitting: Emergency Medicine

## 2014-01-03 ENCOUNTER — Encounter (HOSPITAL_COMMUNITY): Payer: Self-pay | Admitting: Emergency Medicine

## 2014-01-03 DIAGNOSIS — Z79899 Other long term (current) drug therapy: Secondary | ICD-10-CM | POA: Insufficient documentation

## 2014-01-03 DIAGNOSIS — K219 Gastro-esophageal reflux disease without esophagitis: Secondary | ICD-10-CM | POA: Insufficient documentation

## 2014-01-03 DIAGNOSIS — J4489 Other specified chronic obstructive pulmonary disease: Secondary | ICD-10-CM | POA: Insufficient documentation

## 2014-01-03 DIAGNOSIS — J449 Chronic obstructive pulmonary disease, unspecified: Secondary | ICD-10-CM | POA: Insufficient documentation

## 2014-01-03 DIAGNOSIS — Z862 Personal history of diseases of the blood and blood-forming organs and certain disorders involving the immune mechanism: Secondary | ICD-10-CM | POA: Insufficient documentation

## 2014-01-03 DIAGNOSIS — Z792 Long term (current) use of antibiotics: Secondary | ICD-10-CM | POA: Insufficient documentation

## 2014-01-03 DIAGNOSIS — I10 Essential (primary) hypertension: Secondary | ICD-10-CM | POA: Insufficient documentation

## 2014-01-03 DIAGNOSIS — Z8639 Personal history of other endocrine, nutritional and metabolic disease: Secondary | ICD-10-CM | POA: Insufficient documentation

## 2014-01-03 DIAGNOSIS — R21 Rash and other nonspecific skin eruption: Secondary | ICD-10-CM | POA: Insufficient documentation

## 2014-01-03 DIAGNOSIS — R238 Other skin changes: Secondary | ICD-10-CM

## 2014-01-03 MED ORDER — ACYCLOVIR 400 MG PO TABS: 400.0000 mg | ORAL_TABLET | Freq: Four times a day (QID) | ORAL | Status: DC

## 2014-01-03 MED ORDER — HYDROCODONE-ACETAMINOPHEN 5-325 MG PO TABS: 1.0000 | ORAL_TABLET | ORAL | Status: DC | PRN

## 2014-01-03 NOTE — ED Provider Notes (Signed)
Medical screening examination/treatment/procedure(s) were performed by non-physician practitioner and as supervising physician I was immediately available for consultation/collaboration.   EKG Interpretation None        Gertie Broerman S Vedanshi Massaro, MD 01/03/14 1925 

## 2014-01-03 NOTE — ED Notes (Addendum)
Rash on left inside arm w/ blisters  States did work in yard no gloves but no rash on hands has been there since last we

## 2014-01-03 NOTE — ED Provider Notes (Signed)
CSN: [id number]     Arrival date & time 01/03/14  1000 History  This chart was scribed for non-physician practitioner, Fayrene HelperBowie Maelani Yarbro, PA-C working with [name], MD by Greggory StallionKayla Andersen, ED scribe. This patient was seen in room TR11C/TR11C and the patient's care was started at 10:44 AM.   Chief Complaint  Patient presents with  . Rash   The history is provided by the patient. No language interpreter was used.   HPI Comments: Bonnie Ellison is a 68 y.o. female who presents to the Emergency Department complaining of a rash to the inside of her left upper arm and forearm that started 4 days ago. States she worked out in the yard the day before the rash started. Pt states there is burning pain shooting into her hand from the areas. She has taken tylenol with no relief. Denies trouble swallowing, throat swelling, chest pain, SOB, trouble breathing. Denies history of shingles.   Past Medical History  Diagnosis Date  . Other hyperalimentation   . Urticaria   . Other chronic sinusitis   . Allergic rhinitis, cause unspecified   . Unspecified asthma(493.90)   . Chronic airway obstruction, not elsewhere classified   . GERD (gastroesophageal reflux disease)   . Hypertension   . Acid reflux   . Vertigo    Past Surgical History  Procedure Laterality Date  . Total abdominal hysterectomy w/ bilateral salpingoophorectomy    . Cesarean section    . Eye surgery      DETACHED RETINA RIGHT EYE...   . Cataract extraction      RIGHT EYE  . Abdominal hysterectomy  1991    ABDOMINAL HYSTERECTOMY  leiomyomata   Family History  Problem Relation Age of Onset  . Heart failure Mother   . Heart disease Mother   . Pancreatic cancer Father   . Cancer Father [id number]    PANCREATIC (DECEASED)  . Diabetes Brother     DEACESED.Marland Kitchen. HAD A KIDNEY TRANSPLANT  . Stroke Brother [phone]    DECEASED  . Breast cancer Sister [id number]   History  Substance Use Topics  . Smoking status: Never Smoker   . Smokeless tobacco:  Never Used  . Alcohol Use: No   OB History   Grav Para Term Preterm Abortions TAB SAB Ect Mult Living   2 2 2       2      Review of Systems  HENT: Negative for trouble swallowing.   Respiratory: Negative for shortness of breath.   Cardiovascular: Negative for chest pain.  Skin: Positive for rash.  All other systems reviewed and are negative.  Allergies  Shellfish allergy and Wheat bran  Home Medications   Prior to Admission medications   Medication Sig Start Date End Date Taking? Authorizing Provider  acetaminophen (TYLENOL) 325 MG tablet Take 650 mg by mouth every 6 (six) hours as needed.      Historical Provider, MD  estradiol (ESTRACE) 0.5 MG tablet TAKE 1 TABLET (0.5 MG TOTAL) BY MOUTH DAILY. 09/27/13   Dara Lordsimothy P Fontaine, MD  estradiol (ESTRACE) 0.5 MG tablet TAKE 1 TABLET (0.5 MG TOTAL) BY MOUTH DAILY.    Dara Lordsimothy P Fontaine, MD  fexofenadine (ALLEGRA) 180 MG tablet Take 180 mg by mouth daily.      Historical Provider, MD  fluticasone (FLONASE) 50 MCG/ACT nasal spray Place 2 sprays into both nostrils 2 (two) times daily. 09/18/13   Adrian BlackwaterZachary H Baker, PA-C  HYDROCHLOROTHIAZIDE PO Take by mouth.    Historical  Provider, MD  meclizine (ANTIVERT) 25 MG tablet Take 1 tablet (25 mg total) by mouth 4 (four) times daily as needed for dizziness. 09/18/13   Adrian BlackwaterZachary H Baker, PA-C  minocycline (MINOCIN) 100 MG capsule Take 1 capsule (100 mg total) by mouth 2 (two) times daily. 09/18/13   Graylon GoodZachary H Baker, PA-C  nystatin-triamcinolone ointment (MYCOLOG) Apply 1 application topically 2 (two) times daily. 09/27/13   Dara Lordsimothy P Fontaine, MD  omeprazole (PRILOSEC) 40 MG capsule Take 40 mg by mouth daily as needed.      Historical Provider, MD  ondansetron (ZOFRAN ODT) 8 MG disintegrating tablet Take 1 tablet (8 mg total) by mouth every 8 (eight) hours as needed for nausea or vomiting. 09/18/13   Adrian BlackwaterZachary H Baker, PA-C  PROAIR HFA 108 (90 BASE) MCG/ACT inhaler USE 2 PUFFS BY MOUTH EVERY 6 HOURS AS NEEDED FOR  WHEEZING AND SHORTNESS OF BREATH 03/07/13   Waymon Budgelinton D Young, MD   BP 147/86  Pulse 97  Temp(Src) 98.6 F (37 C)  Resp 16  SpO2 98%  LMP 05/10/1990  Physical Exam  Nursing note and vitals reviewed. Constitutional: She is oriented to person, place, and time. She appears well-developed and well-nourished. No distress.  HENT:  Head: Normocephalic and atraumatic.  Eyes: Conjunctivae and EOM are normal.  Neck: Neck supple. No tracheal deviation present.  Cardiovascular: Normal rate.   Pulmonary/Chest: Effort normal. No respiratory distress.  Musculoskeletal: Normal range of motion.  Neurological: She is alert and oriented to person, place, and time.  Skin: Skin is warm and dry.  Left forearm has an ovoid shape of erythematous base with multiple vesicles noted, measuring 2 cm x 4 cm. She has several other smaller ovid, erythematous base lesion noted to left upper arm with vesicles embedded. No pus. No petechiae.   Psychiatric: She has a normal mood and affect. Her behavior is normal.    ED Course  Procedures (including critical care time)  DIAGNOSTIC STUDIES: Oxygen Saturation is 98% on RA, normal by my interpretation.    COORDINATION OF CARE: 10:49 AM-Discussed treatment plan which includes Vicodin and acyclovir for suspected shingles involving the T1-T2 distribution. Pt at bedside and pt agreed to plan. Advised pt to follow up with her PCP if symptoms do not start resolving.   Labs Review Labs Reviewed - No data to display  Imaging Review No results found.   EKG Interpretation None      MDM   Final diagnoses:  Rash, vesicular    BP 147/86  Pulse 97  Temp(Src) 98.6 F (37 C)  Resp 16  SpO2 98%  LMP 05/10/1990   I personally performed the services described in this documentation, which was scribed in my presence. The recorded information has been reviewed and is accurate.  Fayrene HelperBowie Mayana Irigoyen, PA-C 01/03/14 1100

## 2014-01-03 NOTE — Discharge Instructions (Signed)
You have been evaluated for your rash.  Tried taking medication for the full duration. If you notice no improvement, please follow up with your doctor for further care.    Rash A rash is a change in the color or texture of your skin. There are many different types of rashes. You may have other problems that accompany your rash. CAUSES   Infections.  Allergic reactions. This can include allergies to pets or foods.  Certain medicines.  Exposure to certain chemicals, soaps, or cosmetics.  Heat.  Exposure to poisonous plants.  Tumors, both cancerous and noncancerous. SYMPTOMS   Redness.  Scaly skin.  Itchy skin.  Dry or cracked skin.  Bumps.  Blisters.  Pain. DIAGNOSIS  Your caregiver may do a physical exam to determine what type of rash you have. A skin sample (biopsy) may be taken and examined under a microscope. TREATMENT  Treatment depends on the type of rash you have. Your caregiver may prescribe certain medicines. For serious conditions, you may need to see a skin doctor (dermatologist). HOME CARE INSTRUCTIONS   Avoid the substance that caused your rash.  Do not scratch your rash. This can cause infection.  You may take cool baths to help stop itching.  Only take over-the-counter or prescription medicines as directed by your caregiver.  Keep all follow-up appointments as directed by your caregiver. SEEK IMMEDIATE MEDICAL CARE IF:  You have increasing pain, swelling, or redness.  You have a fever.  You have new or severe symptoms.  You have body aches, diarrhea, or vomiting.  Your rash is not better after 3 days. MAKE SURE YOU:  Understand these instructions.  Will watch your condition.  Will get help right away if you are not doing well or get worse. Document Released: 06/21/2002 Document Revised: 09/23/2011 Document Reviewed: 04/15/2011 [facility] Patient Information 2015 [place]. This information is not intended to replace advice given to  you by your health care provider. Make sure you discuss any questions you have with your health care provider.

## 2014-01-07 ENCOUNTER — Encounter: Payer: Self-pay | Admitting: Internal Medicine

## 2014-01-07 ENCOUNTER — Ambulatory Visit (INDEPENDENT_AMBULATORY_CARE_PROVIDER_SITE_OTHER): Payer: Medicare Other | Admitting: Internal Medicine

## 2014-01-07 ENCOUNTER — Other Ambulatory Visit: Payer: Medicare Other

## 2014-01-07 ENCOUNTER — Ambulatory Visit (INDEPENDENT_AMBULATORY_CARE_PROVIDER_SITE_OTHER): Payer: Medicare Other

## 2014-01-07 VITALS — BP 126/82 | HR 97 | Ht 62.5 in | Wt 221.0 lb

## 2014-01-07 DIAGNOSIS — J45998 Other asthma: Secondary | ICD-10-CM

## 2014-01-07 DIAGNOSIS — J302 Other seasonal allergic rhinitis: Secondary | ICD-10-CM

## 2014-01-07 DIAGNOSIS — J309 Allergic rhinitis, unspecified: Secondary | ICD-10-CM

## 2014-01-07 DIAGNOSIS — J449 Chronic obstructive pulmonary disease, unspecified: Secondary | ICD-10-CM

## 2014-01-07 DIAGNOSIS — J4489 Other specified chronic obstructive pulmonary disease: Secondary | ICD-10-CM

## 2014-01-07 DIAGNOSIS — J3089 Other allergic rhinitis: Secondary | ICD-10-CM

## 2014-01-07 DIAGNOSIS — J45909 Unspecified asthma, uncomplicated: Secondary | ICD-10-CM

## 2014-01-07 DIAGNOSIS — H8110 Benign paroxysmal vertigo, unspecified ear: Secondary | ICD-10-CM

## 2014-01-07 NOTE — Progress Notes (Signed)
Patient ID: Bonnie Ellison, female    DOB: 1946-06-08, 68 y.o.   MRN: [id number]  HPI 11/15/10- 68 yo with chronic sinusitis, rhinitis, chronic obstructive asthma, obesity hypoventilation Last here Aug 27, 2010- note reviewed. Acute visit- starting 2-3 days ago has been wheezing and blowing nasal congestion. She had been outdoors at park, then gone where shellfish were being cooked outdoors. She added back Singulair yesterday and will start prednisone taper we called in yesterday afternoon. Today a little better, throat itches, head still stopped up, eyes itch.  02/06/11-  68 yo with chronic sinusitis, rhinitis, chronic obstructive asthma, obesity hypoventilation Acute- Blames weather and in and out of heat for onset of building sinus infection. 1 week- Feels tender left upper jaw and tender to touch cheek, teeth. Nothing blowing out of left nostril. No fever. Has Neti pot- reminded to use it.  Allergy vaccine continues vial A 1:50, vial B 1:50,000 GH  06/28/11-  68 yo with chronic sinusitis, rhinitis, chronic obstructive asthma, obesity hypoventilation Has had flu vaccine. Says ate near the kitchen at a [employer] [place] last night where the fumes made chest tight and made her face itch. She stayed to eat her meal and says the food was not the problem. Leaving the restaurant and stepping out into the very cold air made her tighter. She does not think she has a cold.  08/30/11-65 yoF never smoker with chronic sinusitis, rhinitis, chronic obstructive asthma, obesity hypoventilation She has done well since last here in December. Uses daily Allegra to prevent swelling of left tongue and cheek. Denies wheeze, sinus pain, purulent drainage or hives. She continues allergy vaccine without problems but has never been able to progress beyond 1-50,000.  04/10/12- 65 yoF never smoker with chronic sinusitis, rhinitis, chronic obstructive asthma, obesity hypoventilation Pt states doing well, still on vaccine  1:50,000  GHholding dose.. Denies co's.  She has no reactions at this concentration and volume of allergy vaccine, which is the highest she has tolerated. Had food allergy testing by Dr. Renae GlossShelton and reports allergy(actually elevated antibodies including wheat and almonds. She thinks these might make her lips itch, but is unsure. We discussed the difference between presence of antibodies to foods on blood test versus actual physiologic pattern of symptomatic allergic response. I advised to watch closely those foods she tested positive for and avoid any foods that cause her symptoms. NB- next visit update PFT/ AP/ ? This  10/09/12- 65 yoF never smoker with chronic sinusitis, rhinitis, chronic obstructive asthma, obesity hypoventilation Follows for : pt states doing well , still on vaccine 1:50,000 . Denies any complaints Admits occasional sinus headache/frontal pressure and sometimes some pain in upper teeth. Little nasal discharge. Never able to stay on vaccine stronger than 1:50,000 because of local reactions. No systemic reaction. She feels the vaccine helps her.  01/07/14-  68 yoF never smoker with chronic sinusitis, rhinitis, chronic obstructive asthma, obesity hypoventilation FOLLOWS FOR:  Discuss vertigo.  Allergy Vaccine 1:50,000 GH doing well and allergies under control at this time Went to ER for vertigo in the spring, around May. Had shingles left forearm. Denies wheeze cough or sinusitis.  Review of Systems-see HPI Constitutional:   No-   weight loss, night sweats, fevers, chills, fatigue, lassitude. HEENT:   + headaches,  No-difficulty swallowing, tooth/dental problems, sore throat,       No-  sneezing, itching, ear ache, +nasal congestion, post nasal drip,  CV:  No-   chest pain, orthopnea, PND, swelling  in lower extremities, anasarca, dizziness, palpitations Resp: Increased shortness of breath with exertion or at rest.              No-   productive cough,  No non-productive cough,   No- coughing up of blood.              No-   change in color of mucus.  Little recent wheezing.   Skin: No-   rash or lesions. No hives. GI:  No-   heartburn, indigestion, abdominal pain, nausea, vomiting,  GU:  MS:  No-   joint pain or swelling.   Neuro-     nothing unusual Psych:  No- change in mood or affect. No depression or anxiety.  No memory loss. Objective:   Physical Exam General- Alert, Oriented, Affect-appropriate, Distress- none acute, overweight Skin- rash-none, lesions- none, excoriation- none Lymphadenopathy- none Head- atraumatic            Eyes- Gross vision intact, PERRLA, conjunctivae clear secretions. +Chronic periorbital edema            Ears- Hearing, canals-normal, TMs clear            Nose- +turbinate edema, no-Septal dev, mucus, polyps, erosion, perforation             Throat- Mallampati II , mucosa clear , drainage- none, tonsils- atrophic, clear w/o swelling. Much dental repair. Neck- flexible , trachea midline, no stridor , thyroid nl, carotid no bruit Chest - symmetrical excursion , unlabored           Heart/CV- RRR , no murmur , no gallop  , no rub, nl s1 s2                           - JVD- none , edema- none, stasis changes- none, varices- none           Lung-  clear , cough- none , dullness-none, rub- none           Chest wall-  Abd- Br/ Gen/ Rectal- Not done, not indicated Extrem- cyanosis- none, clubbing, none, atrophy- none, strength- nl Neuro- grossly intact to observation

## 2014-01-07 NOTE — Patient Instructions (Signed)
Try using the flonase/ fluticasone nasal spray   1-2 puffs each nostril once daily at bedtime. You could use it an additional time each day if needed  We can continue allergy vaccine 1:50,000 GH  Order- lab- Allergy profile    Dx allergic rhinitis  Order- schedule PFT   Dx chronic obstructive asthma  For vertigo- try otc Meclizine (like dramamine) and Sudafed-PE

## 2014-01-10 LAB — ALLERGY FULL PROFILE
Allergen, D pternoyssinus,d7: 47 kU/L — ABNORMAL HIGH
Allergen,Goose feathers, e70: 0.54 kU/L — ABNORMAL HIGH
Alternaria Alternata: <0.1 kU/L
Aspergillus fumigatus, m3: <0.1 kU/L
Bahia Grass: 0.18 kU/L — ABNORMAL HIGH
Bermuda Grass: 0.26 kU/L — ABNORMAL HIGH
Box Elder IgE: 0.72 kU/L — ABNORMAL HIGH
Candida Albicans: <0.1 kU/L
Cat Dander: 0.11 kU/L — ABNORMAL HIGH
Common Ragweed: 1.15 kU/L — ABNORMAL HIGH
Curvularia lunata: <0.1 kU/L
D. farinae: 71 kU/L — ABNORMAL HIGH
Dog Dander: 0.35 kU/L — ABNORMAL HIGH
Elm IgE: 0.16 kU/L — ABNORMAL HIGH
Fescue: 0.18 kU/L — ABNORMAL HIGH
G005 Rye, Perennial: 0.35 kU/L — ABNORMAL HIGH
G009 Red Top: 0.65 kU/L — ABNORMAL HIGH
Goldenrod: 1.82 kU/L — ABNORMAL HIGH
Helminthosporium halodes: <0.1 kU/L
House Dust Hollister: 3.47 kU/L — ABNORMAL HIGH
IgE (Immunoglobulin E), Serum: 709.3 IU/mL — ABNORMAL HIGH (ref 0.0–180.0)
Lamb's Quarters: 0.43 kU/L — ABNORMAL HIGH
Oak: 0.15 kU/L — ABNORMAL HIGH
Plantain: 0.29 kU/L — ABNORMAL HIGH
Stemphylium Botryosum: <0.1 kU/L
Sycamore Tree: 0.16 kU/L — ABNORMAL HIGH
Timothy Grass: 0.24 kU/L — ABNORMAL HIGH

## 2014-01-13 ENCOUNTER — Ambulatory Visit (INDEPENDENT_AMBULATORY_CARE_PROVIDER_SITE_OTHER): Payer: Medicare Other

## 2014-01-13 DIAGNOSIS — J309 Allergic rhinitis, unspecified: Secondary | ICD-10-CM

## 2014-01-21 ENCOUNTER — Ambulatory Visit (INDEPENDENT_AMBULATORY_CARE_PROVIDER_SITE_OTHER): Payer: Medicare Other

## 2014-01-21 DIAGNOSIS — J309 Allergic rhinitis, unspecified: Secondary | ICD-10-CM

## 2014-01-28 ENCOUNTER — Ambulatory Visit (INDEPENDENT_AMBULATORY_CARE_PROVIDER_SITE_OTHER): Payer: Medicare Other

## 2014-01-28 DIAGNOSIS — J309 Allergic rhinitis, unspecified: Secondary | ICD-10-CM

## 2014-02-04 ENCOUNTER — Ambulatory Visit (INDEPENDENT_AMBULATORY_CARE_PROVIDER_SITE_OTHER): Payer: Medicare Other

## 2014-02-04 ENCOUNTER — Telehealth: Payer: Self-pay | Admitting: Internal Medicine

## 2014-02-04 DIAGNOSIS — J309 Allergic rhinitis, unspecified: Secondary | ICD-10-CM

## 2014-02-08 NOTE — Telephone Encounter (Signed)
Pt aware that handicap placard has been completed and signed by CY; pt requests we leave form with Bonnie Ellison in allergy lab to pick up when she comes in for her allergy shot.

## 2014-02-11 ENCOUNTER — Ambulatory Visit (INDEPENDENT_AMBULATORY_CARE_PROVIDER_SITE_OTHER): Payer: Medicare Other

## 2014-02-11 DIAGNOSIS — J309 Allergic rhinitis, unspecified: Secondary | ICD-10-CM

## 2014-02-18 ENCOUNTER — Ambulatory Visit (INDEPENDENT_AMBULATORY_CARE_PROVIDER_SITE_OTHER): Payer: Medicare Other

## 2014-02-18 DIAGNOSIS — J309 Allergic rhinitis, unspecified: Secondary | ICD-10-CM

## 2014-02-23 ENCOUNTER — Encounter: Payer: Self-pay | Admitting: Internal Medicine

## 2014-02-25 ENCOUNTER — Ambulatory Visit (INDEPENDENT_AMBULATORY_CARE_PROVIDER_SITE_OTHER): Payer: Medicare Other

## 2014-02-25 DIAGNOSIS — J309 Allergic rhinitis, unspecified: Secondary | ICD-10-CM

## 2014-03-04 ENCOUNTER — Ambulatory Visit (INDEPENDENT_AMBULATORY_CARE_PROVIDER_SITE_OTHER): Payer: Medicare Other

## 2014-03-04 DIAGNOSIS — J309 Allergic rhinitis, unspecified: Secondary | ICD-10-CM

## 2014-03-11 ENCOUNTER — Ambulatory Visit: Payer: Medicare Other

## 2014-03-14 ENCOUNTER — Ambulatory Visit (INDEPENDENT_AMBULATORY_CARE_PROVIDER_SITE_OTHER): Payer: Medicare Other

## 2014-03-14 DIAGNOSIS — J309 Allergic rhinitis, unspecified: Secondary | ICD-10-CM

## 2014-03-17 ENCOUNTER — Telehealth: Payer: Self-pay | Admitting: Internal Medicine

## 2014-03-17 NOTE — Telephone Encounter (Signed)
[email or web address].Jennifer Castillo, CMA  

## 2014-03-18 MED ORDER — FLUTICASONE PROPIONATE 50 MCG/ACT NA SUSP: 2.0000 | Freq: Two times a day (BID) | NASAL | Status: DC

## 2014-03-18 NOTE — Telephone Encounter (Signed)
Pt is returning call & asks to be reached at [phone].  Bonnie Ellison

## 2014-03-18 NOTE — Telephone Encounter (Signed)
Refill sent, pt aware. Somnang Mahan, CMA  

## 2014-03-25 ENCOUNTER — Ambulatory Visit (INDEPENDENT_AMBULATORY_CARE_PROVIDER_SITE_OTHER): Payer: Medicare Other

## 2014-03-25 DIAGNOSIS — J309 Allergic rhinitis, unspecified: Secondary | ICD-10-CM

## 2014-04-01 ENCOUNTER — Ambulatory Visit (INDEPENDENT_AMBULATORY_CARE_PROVIDER_SITE_OTHER): Payer: Medicare Other

## 2014-04-01 DIAGNOSIS — J309 Allergic rhinitis, unspecified: Secondary | ICD-10-CM

## 2014-04-03 DIAGNOSIS — H8110 Benign paroxysmal vertigo, unspecified ear: Secondary | ICD-10-CM | POA: Insufficient documentation

## 2014-04-03 NOTE — Assessment & Plan Note (Signed)
ER  visit May 2015 Plan-meclizine, Sudafed PE if symptoms of nasal congestion and eustachian dysfunction

## 2014-04-03 NOTE — Assessment & Plan Note (Signed)
Good control. Plan- allergy profile, PFT

## 2014-04-03 NOTE — Assessment & Plan Note (Signed)
Still holding allergy vaccine at 1:50,000 which is maximum tolerated concentration. She feels it helps. Not clear that vertigo earlier this year was related to rhinitis or eustachian dysfunction

## 2014-04-08 ENCOUNTER — Ambulatory Visit (INDEPENDENT_AMBULATORY_CARE_PROVIDER_SITE_OTHER): Payer: Medicare Other

## 2014-04-08 DIAGNOSIS — J309 Allergic rhinitis, unspecified: Secondary | ICD-10-CM

## 2014-04-15 ENCOUNTER — Ambulatory Visit (INDEPENDENT_AMBULATORY_CARE_PROVIDER_SITE_OTHER): Payer: Medicare Other

## 2014-04-15 DIAGNOSIS — J309 Allergic rhinitis, unspecified: Secondary | ICD-10-CM

## 2014-04-20 ENCOUNTER — Encounter: Payer: Self-pay | Admitting: Internal Medicine

## 2014-04-22 ENCOUNTER — Ambulatory Visit (INDEPENDENT_AMBULATORY_CARE_PROVIDER_SITE_OTHER): Payer: Medicare Other

## 2014-04-22 DIAGNOSIS — J309 Allergic rhinitis, unspecified: Secondary | ICD-10-CM

## 2014-04-29 ENCOUNTER — Ambulatory Visit (INDEPENDENT_AMBULATORY_CARE_PROVIDER_SITE_OTHER): Payer: Medicare Other

## 2014-04-29 DIAGNOSIS — J309 Allergic rhinitis, unspecified: Secondary | ICD-10-CM

## 2014-05-06 ENCOUNTER — Ambulatory Visit (INDEPENDENT_AMBULATORY_CARE_PROVIDER_SITE_OTHER): Payer: Medicare Other

## 2014-05-06 DIAGNOSIS — J309 Allergic rhinitis, unspecified: Secondary | ICD-10-CM

## 2014-05-09 ENCOUNTER — Encounter: Payer: Self-pay | Admitting: Internal Medicine

## 2014-05-09 ENCOUNTER — Ambulatory Visit (INDEPENDENT_AMBULATORY_CARE_PROVIDER_SITE_OTHER): Payer: Medicare Other | Admitting: Internal Medicine

## 2014-05-09 VITALS — BP 158/104 | HR 80 | Ht 63.0 in | Wt 223.2 lb

## 2014-05-09 DIAGNOSIS — Z23 Encounter for immunization: Secondary | ICD-10-CM

## 2014-05-09 MED ORDER — AZELASTINE-FLUTICASONE 137-50 MCG/ACT NA SUSP
1.0000 | Freq: Once | NASAL | Status: DC
Start: 2014-05-09 — End: 2015-05-15

## 2014-05-09 NOTE — Patient Instructions (Signed)
Flu vax  Sample Dymista nasal spray  1-2 puffs each nstril once daily at bedtime. You can still take Allegra or Zyrtec in addition as needed  We can continue allergy vaccine

## 2014-05-09 NOTE — Progress Notes (Signed)
Patient ID: Bonnie Ellison, female    DOB: 12-Mar-1946, 68 y.o.   MRN: [id number]  HPI 11/15/10- 68 yo with chronic sinusitis, rhinitis, chronic obstructive asthma, obesity hypoventilation Last here Aug 27, 2010- note reviewed. Acute visit- starting 2-3 days ago has been wheezing and blowing nasal congestion. She had been outdoors at park, then gone where shellfish were being cooked outdoors. She added back Singulair yesterday and will start prednisone taper we called in yesterday afternoon. Today a little better, throat itches, head still stopped up, eyes itch.  02/06/11-  68 yo with chronic sinusitis, rhinitis, chronic obstructive asthma, obesity hypoventilation Acute- Blames weather and in and out of heat for onset of building sinus infection. 1 week- Feels tender left upper jaw and tender to touch cheek, teeth. Nothing blowing out of left nostril. No fever. Has Neti pot- reminded to use it.  Allergy vaccine continues vial A 1:50, vial B 1:50,000 GH  06/28/11-  68 yo with chronic sinusitis, rhinitis, chronic obstructive asthma, obesity hypoventilation Has had flu vaccine. Says ate near the kitchen at a [employer] [place] last night where the fumes made chest tight and made her face itch. She stayed to eat her meal and says the food was not the problem. Leaving the restaurant and stepping out into the very cold air made her tighter. She does not think she has a cold.  08/30/11-65 yoF never smoker with chronic sinusitis, rhinitis, chronic obstructive asthma, obesity hypoventilation She has done well since last here in December. Uses daily Allegra to prevent swelling of left tongue and cheek. Denies wheeze, sinus pain, purulent drainage or hives. She continues allergy vaccine without problems but has never been able to progress beyond 1-50,000.  04/10/12- 65 yoF never smoker with chronic sinusitis, rhinitis, chronic obstructive asthma, obesity hypoventilation Pt states doing well, still on vaccine  1:50,000  GHholding dose.. Denies co's.  She has no reactions at this concentration and volume of allergy vaccine, which is the highest she has tolerated. Had food allergy testing by Dr. Renae GlossShelton and reports allergy(actually elevated antibodies including wheat and almonds. She thinks these might make her lips itch, but is unsure. We discussed the difference between presence of antibodies to foods on blood test versus actual physiologic pattern of symptomatic allergic response. I advised to watch closely those foods she tested positive for and avoid any foods that cause her symptoms. NB- next visit update PFT/ AP/ ? This  10/09/12- 65 yoF never smoker with chronic sinusitis, rhinitis, chronic obstructive asthma, obesity hypoventilation Follows for : pt states doing well , still on vaccine 1:50,000 . Denies any complaints Admits occasional sinus headache/frontal pressure and sometimes some pain in upper teeth. Little nasal discharge. Never able to stay on vaccine stronger than 1:50,000 because of local reactions. No systemic reaction. She feels the vaccine helps her.  01/07/14-  68 yoF never smoker with chronic sinusitis, rhinitis, chronic obstructive asthma, obesity hypoventilation FOLLOWS FOR:  Discuss vertigo.  Allergy Vaccine 1:50,000 GH doing well and allergies under control at this time Went to ER for vertigo in the spring, around May. Had shingles left forearm. Denies wheeze cough or sinusitis.  05/09/14- 68 yoF never smoker with chronic sinusitis, rhinitis, chronic obstructive asthma, obesity hypoventilation FOLLOW FOR:  Allergies; having to take Allegra and flonase every day, a lot of sinus congestion and soreness in cheeks under eyes    Review of Systems-see HPI Constitutional:   No-   weight loss, night sweats, fevers, chills, fatigue, lassitude.  HEENT:   + headaches,  No-difficulty swallowing, tooth/dental problems, sore throat,       No-  sneezing, itching, ear ache, +nasal  congestion, post nasal drip,  CV:  No-   chest pain, orthopnea, PND, swelling in lower extremities, anasarca, dizziness, palpitations Resp: Increased shortness of breath with exertion or at rest.              No-   productive cough,  No non-productive cough,  No- coughing up of blood.              No-   change in color of mucus.  Little recent wheezing.   Skin: No-   rash or lesions. No hives. GI:  No-   heartburn, indigestion, abdominal pain, nausea, vomiting,  GU:  MS:  No-   joint pain or swelling.   Neuro-     nothing unusual Psych:  No- change in mood or affect. No depression or anxiety.  No memory loss. Objective:   Physical Exam General- Alert, Oriented, Affect-appropriate, Distress- none acute, overweight Skin- rash-none, lesions- none, excoriation- none Lymphadenopathy- none Head- atraumatic            Eyes- Gross vision intact, PERRLA, conjunctivae clear secretions. +Chronic periorbital edema            Ears- Hearing, canals-normal, TMs clear            Nose- +turbinate edema, no-Septal dev, mucus, polyps, erosion, perforation             Throat- Mallampati II , mucosa clear , drainage- none, tonsils- atrophic, clear w/o swelling. Much dental repair. Neck- flexible , trachea midline, no stridor , thyroid nl, carotid no bruit Chest - symmetrical excursion , unlabored           Heart/CV- RRR , no murmur , no gallop  , no rub, nl s1 s2                           - JVD- none , edema- none, stasis changes- none, varices- none           Lung-  clear , cough- none , dullness-none, rub- none           Chest wall-  Abd- Br/ Gen/ Rectal- Not done, not indicated Extrem- cyanosis- none, clubbing, none, atrophy- none, strength- nl Neuro- grossly intact to observation

## 2014-05-13 ENCOUNTER — Ambulatory Visit (INDEPENDENT_AMBULATORY_CARE_PROVIDER_SITE_OTHER): Payer: Medicare Other

## 2014-05-13 DIAGNOSIS — J309 Allergic rhinitis, unspecified: Secondary | ICD-10-CM

## 2014-05-16 ENCOUNTER — Encounter: Payer: Self-pay | Admitting: Internal Medicine

## 2014-05-20 ENCOUNTER — Ambulatory Visit (INDEPENDENT_AMBULATORY_CARE_PROVIDER_SITE_OTHER): Payer: Medicare Other

## 2014-05-20 DIAGNOSIS — J309 Allergic rhinitis, unspecified: Secondary | ICD-10-CM

## 2014-05-23 ENCOUNTER — Encounter: Payer: Self-pay | Admitting: Internal Medicine

## 2014-05-27 ENCOUNTER — Ambulatory Visit (INDEPENDENT_AMBULATORY_CARE_PROVIDER_SITE_OTHER): Payer: Medicare Other

## 2014-05-27 DIAGNOSIS — J309 Allergic rhinitis, unspecified: Secondary | ICD-10-CM

## 2014-06-03 ENCOUNTER — Encounter: Payer: Self-pay | Admitting: Internal Medicine

## 2014-06-03 ENCOUNTER — Ambulatory Visit (INDEPENDENT_AMBULATORY_CARE_PROVIDER_SITE_OTHER): Payer: Medicare Other

## 2014-06-03 DIAGNOSIS — J309 Allergic rhinitis, unspecified: Secondary | ICD-10-CM

## 2014-06-08 ENCOUNTER — Ambulatory Visit (INDEPENDENT_AMBULATORY_CARE_PROVIDER_SITE_OTHER): Payer: Medicare Other

## 2014-06-08 DIAGNOSIS — J309 Allergic rhinitis, unspecified: Secondary | ICD-10-CM

## 2014-06-10 ENCOUNTER — Ambulatory Visit (INDEPENDENT_AMBULATORY_CARE_PROVIDER_SITE_OTHER): Payer: Medicare Other

## 2014-06-10 DIAGNOSIS — J309 Allergic rhinitis, unspecified: Secondary | ICD-10-CM

## 2014-06-17 ENCOUNTER — Ambulatory Visit (INDEPENDENT_AMBULATORY_CARE_PROVIDER_SITE_OTHER): Payer: Medicare Other

## 2014-06-17 DIAGNOSIS — J309 Allergic rhinitis, unspecified: Secondary | ICD-10-CM

## 2014-06-24 ENCOUNTER — Ambulatory Visit (INDEPENDENT_AMBULATORY_CARE_PROVIDER_SITE_OTHER): Payer: Medicare Other

## 2014-06-24 DIAGNOSIS — J309 Allergic rhinitis, unspecified: Secondary | ICD-10-CM

## 2014-07-01 ENCOUNTER — Ambulatory Visit (INDEPENDENT_AMBULATORY_CARE_PROVIDER_SITE_OTHER): Payer: Medicare Other

## 2014-07-01 DIAGNOSIS — J309 Allergic rhinitis, unspecified: Secondary | ICD-10-CM

## 2014-07-05 ENCOUNTER — Ambulatory Visit (INDEPENDENT_AMBULATORY_CARE_PROVIDER_SITE_OTHER): Payer: Medicare Other

## 2014-07-05 DIAGNOSIS — J309 Allergic rhinitis, unspecified: Secondary | ICD-10-CM

## 2014-07-06 ENCOUNTER — Ambulatory Visit (INDEPENDENT_AMBULATORY_CARE_PROVIDER_SITE_OTHER): Payer: Medicare Other

## 2014-07-06 DIAGNOSIS — J309 Allergic rhinitis, unspecified: Secondary | ICD-10-CM

## 2014-07-10 ENCOUNTER — Other Ambulatory Visit: Payer: Self-pay | Admitting: Gynecology

## 2014-07-14 ENCOUNTER — Ambulatory Visit (INDEPENDENT_AMBULATORY_CARE_PROVIDER_SITE_OTHER): Payer: Medicare Other

## 2014-07-14 DIAGNOSIS — J309 Allergic rhinitis, unspecified: Secondary | ICD-10-CM

## 2014-07-20 ENCOUNTER — Encounter: Payer: Self-pay | Admitting: Internal Medicine

## 2014-07-22 ENCOUNTER — Ambulatory Visit (INDEPENDENT_AMBULATORY_CARE_PROVIDER_SITE_OTHER): Payer: Medicare Other

## 2014-07-22 DIAGNOSIS — J309 Allergic rhinitis, unspecified: Secondary | ICD-10-CM

## 2014-07-29 ENCOUNTER — Ambulatory Visit (INDEPENDENT_AMBULATORY_CARE_PROVIDER_SITE_OTHER): Payer: Medicare Other

## 2014-07-29 DIAGNOSIS — J309 Allergic rhinitis, unspecified: Secondary | ICD-10-CM

## 2014-08-04 ENCOUNTER — Ambulatory Visit (INDEPENDENT_AMBULATORY_CARE_PROVIDER_SITE_OTHER): Payer: Medicare Other

## 2014-08-04 DIAGNOSIS — J309 Allergic rhinitis, unspecified: Secondary | ICD-10-CM

## 2014-08-05 ENCOUNTER — Ambulatory Visit: Payer: Medicare Other

## 2014-08-12 ENCOUNTER — Ambulatory Visit (INDEPENDENT_AMBULATORY_CARE_PROVIDER_SITE_OTHER): Payer: Medicare Other

## 2014-08-12 DIAGNOSIS — J309 Allergic rhinitis, unspecified: Secondary | ICD-10-CM

## 2014-08-16 ENCOUNTER — Other Ambulatory Visit: Payer: Self-pay | Admitting: Physician Assistant

## 2014-08-16 DIAGNOSIS — R1011 Right upper quadrant pain: Secondary | ICD-10-CM

## 2014-08-19 ENCOUNTER — Ambulatory Visit (INDEPENDENT_AMBULATORY_CARE_PROVIDER_SITE_OTHER): Payer: Medicare Other

## 2014-08-19 DIAGNOSIS — J309 Allergic rhinitis, unspecified: Secondary | ICD-10-CM

## 2014-08-22 ENCOUNTER — Encounter: Payer: Self-pay | Admitting: Internal Medicine

## 2014-08-22 ENCOUNTER — Ambulatory Visit
Admission: RE | Admit: 2014-08-22 | Discharge: 2014-08-22 | Disposition: A | Discharge status: Home / Self Care | Payer: Medicare Other | Source: Ambulatory Visit | Attending: Physician Assistant | Admitting: Physician Assistant

## 2014-08-22 DIAGNOSIS — R1011 Right upper quadrant pain: Secondary | ICD-10-CM

## 2014-08-26 ENCOUNTER — Ambulatory Visit (INDEPENDENT_AMBULATORY_CARE_PROVIDER_SITE_OTHER): Payer: Medicare Other

## 2014-08-26 DIAGNOSIS — J309 Allergic rhinitis, unspecified: Secondary | ICD-10-CM

## 2014-09-02 ENCOUNTER — Ambulatory Visit (INDEPENDENT_AMBULATORY_CARE_PROVIDER_SITE_OTHER): Payer: Medicare Other

## 2014-09-02 DIAGNOSIS — J309 Allergic rhinitis, unspecified: Secondary | ICD-10-CM

## 2014-09-09 ENCOUNTER — Ambulatory Visit (INDEPENDENT_AMBULATORY_CARE_PROVIDER_SITE_OTHER): Payer: Medicare Other

## 2014-09-09 DIAGNOSIS — J309 Allergic rhinitis, unspecified: Secondary | ICD-10-CM

## 2014-09-16 ENCOUNTER — Ambulatory Visit (INDEPENDENT_AMBULATORY_CARE_PROVIDER_SITE_OTHER): Payer: Medicare Other

## 2014-09-16 DIAGNOSIS — J309 Allergic rhinitis, unspecified: Secondary | ICD-10-CM

## 2014-09-23 ENCOUNTER — Ambulatory Visit (INDEPENDENT_AMBULATORY_CARE_PROVIDER_SITE_OTHER): Payer: Medicare Other

## 2014-09-23 DIAGNOSIS — J309 Allergic rhinitis, unspecified: Secondary | ICD-10-CM

## 2014-09-30 ENCOUNTER — Ambulatory Visit (INDEPENDENT_AMBULATORY_CARE_PROVIDER_SITE_OTHER): Payer: Medicare Other

## 2014-09-30 DIAGNOSIS — J309 Allergic rhinitis, unspecified: Secondary | ICD-10-CM

## 2014-10-06 ENCOUNTER — Ambulatory Visit: Payer: Medicare Other

## 2014-10-12 ENCOUNTER — Encounter: Payer: Self-pay | Admitting: Internal Medicine

## 2014-10-13 ENCOUNTER — Ambulatory Visit (INDEPENDENT_AMBULATORY_CARE_PROVIDER_SITE_OTHER): Payer: Medicare Other

## 2014-10-13 DIAGNOSIS — J309 Allergic rhinitis, unspecified: Secondary | ICD-10-CM

## 2014-10-20 ENCOUNTER — Other Ambulatory Visit: Payer: Self-pay | Admitting: Gastroenterology

## 2014-10-20 DIAGNOSIS — R634 Abnormal weight loss: Secondary | ICD-10-CM

## 2014-10-20 DIAGNOSIS — R6881 Early satiety: Secondary | ICD-10-CM

## 2014-10-20 DIAGNOSIS — R7989 Other specified abnormal findings of blood chemistry: Secondary | ICD-10-CM

## 2014-10-20 DIAGNOSIS — R945 Abnormal results of liver function studies: Secondary | ICD-10-CM

## 2014-10-21 ENCOUNTER — Ambulatory Visit: Payer: Medicare Other

## 2014-10-25 ENCOUNTER — Encounter (HOSPITAL_COMMUNITY): Payer: Self-pay

## 2014-10-25 ENCOUNTER — Ambulatory Visit (HOSPITAL_COMMUNITY)
Admission: RE | Admit: 2014-10-25 | Discharge: 2014-10-25 | Disposition: A | Discharge status: Home / Self Care | Payer: Medicare Other | Source: Ambulatory Visit | Attending: Gastroenterology | Admitting: Gastroenterology

## 2014-10-25 DIAGNOSIS — R1011 Right upper quadrant pain: Secondary | ICD-10-CM | POA: Insufficient documentation

## 2014-10-25 DIAGNOSIS — K760 Fatty (change of) liver, not elsewhere classified: Secondary | ICD-10-CM

## 2014-10-25 DIAGNOSIS — R6881 Early satiety: Secondary | ICD-10-CM

## 2014-10-25 DIAGNOSIS — M5136 Other intervertebral disc degeneration, lumbar region: Secondary | ICD-10-CM

## 2014-10-25 DIAGNOSIS — R7989 Other specified abnormal findings of blood chemistry: Secondary | ICD-10-CM

## 2014-10-25 DIAGNOSIS — E860 Dehydration: Secondary | ICD-10-CM | POA: Diagnosis not present

## 2014-10-25 DIAGNOSIS — K8000 Calculus of gallbladder with acute cholecystitis without obstruction: Secondary | ICD-10-CM | POA: Diagnosis not present

## 2014-10-25 DIAGNOSIS — K8020 Calculus of gallbladder without cholecystitis without obstruction: Secondary | ICD-10-CM

## 2014-10-25 DIAGNOSIS — K769 Liver disease, unspecified: Secondary | ICD-10-CM

## 2014-10-25 DIAGNOSIS — K5730 Diverticulosis of large intestine without perforation or abscess without bleeding: Secondary | ICD-10-CM

## 2014-10-25 DIAGNOSIS — M47816 Spondylosis without myelopathy or radiculopathy, lumbar region: Secondary | ICD-10-CM

## 2014-10-25 DIAGNOSIS — R945 Abnormal results of liver function studies: Secondary | ICD-10-CM

## 2014-10-25 DIAGNOSIS — R634 Abnormal weight loss: Secondary | ICD-10-CM | POA: Insufficient documentation

## 2014-10-25 DIAGNOSIS — M160 Bilateral primary osteoarthritis of hip: Secondary | ICD-10-CM

## 2014-10-25 DIAGNOSIS — R112 Nausea with vomiting, unspecified: Secondary | ICD-10-CM | POA: Insufficient documentation

## 2014-10-25 MED ORDER — IOHEXOL 300 MG/ML  SOLN
100.0000 mL | Freq: Once | INTRAMUSCULAR | Status: AC | PRN
  Administered 2014-10-25: 100 mL via INTRAVENOUS

## 2014-10-26 ENCOUNTER — Ambulatory Visit: Payer: Medicare Other

## 2014-10-28 ENCOUNTER — Inpatient Hospital Stay (HOSPITAL_COMMUNITY)
Admission: EM | Admit: 2014-10-28 | Discharge: 2014-11-01 | DRG: 418 | Disposition: A | Discharge status: Home / Self Care | Payer: Medicare Other | Attending: General Surgery | Admitting: General Surgery

## 2014-10-28 ENCOUNTER — Encounter (HOSPITAL_COMMUNITY): Payer: Self-pay | Admitting: Neurology

## 2014-10-28 DIAGNOSIS — J309 Allergic rhinitis, unspecified: Secondary | ICD-10-CM | POA: Diagnosis present

## 2014-10-28 DIAGNOSIS — K219 Gastro-esophageal reflux disease without esophagitis: Secondary | ICD-10-CM | POA: Diagnosis present

## 2014-10-28 DIAGNOSIS — E876 Hypokalemia: Secondary | ICD-10-CM | POA: Diagnosis present

## 2014-10-28 DIAGNOSIS — Z79899 Other long term (current) drug therapy: Secondary | ICD-10-CM | POA: Diagnosis not present

## 2014-10-28 DIAGNOSIS — I10 Essential (primary) hypertension: Secondary | ICD-10-CM | POA: Diagnosis present

## 2014-10-28 DIAGNOSIS — Z7951 Long term (current) use of inhaled steroids: Secondary | ICD-10-CM

## 2014-10-28 DIAGNOSIS — E860 Dehydration: Secondary | ICD-10-CM | POA: Diagnosis not present

## 2014-10-28 DIAGNOSIS — J449 Chronic obstructive pulmonary disease, unspecified: Secondary | ICD-10-CM | POA: Diagnosis present

## 2014-10-28 DIAGNOSIS — K8000 Calculus of gallbladder with acute cholecystitis without obstruction: Principal | ICD-10-CM | POA: Diagnosis present

## 2014-10-28 DIAGNOSIS — N19 Unspecified kidney failure: Secondary | ICD-10-CM

## 2014-10-28 DIAGNOSIS — E440 Moderate protein-calorie malnutrition: Secondary | ICD-10-CM | POA: Insufficient documentation

## 2014-10-28 DIAGNOSIS — Z91013 Allergy to seafood: Secondary | ICD-10-CM

## 2014-10-28 DIAGNOSIS — J329 Chronic sinusitis, unspecified: Secondary | ICD-10-CM | POA: Diagnosis not present

## 2014-10-28 DIAGNOSIS — N179 Acute kidney failure, unspecified: Secondary | ICD-10-CM | POA: Diagnosis not present

## 2014-10-28 DIAGNOSIS — K8020 Calculus of gallbladder without cholecystitis without obstruction: Secondary | ICD-10-CM | POA: Diagnosis present

## 2014-10-28 LAB — CBC WITH DIFFERENTIAL/PLATELET
Basophils Absolute: 0 10*3/uL (ref 0.0–0.1)
Basophils Relative: 0 % (ref 0–1)
Eosinophils Absolute: 0.3 10*3/uL (ref 0.0–0.7)
Eosinophils Relative: 3 % (ref 0–5)
HCT: 41 % (ref 36.0–46.0)
Hemoglobin: 14 g/dL (ref 12.0–15.0)
Lymphocytes Relative: 15 % (ref 12–46)
Lymphs Abs: 1.3 10*3/uL (ref 0.7–4.0)
MCH: 27.2 pg (ref 26.0–34.0)
MCHC: 34.1 g/dL (ref 30.0–36.0)
MCV: 79.6 fL (ref 78.0–100.0)
Monocytes Absolute: 1.1 10*3/uL — ABNORMAL HIGH (ref 0.1–1.0)
Monocytes Relative: 13 % — ABNORMAL HIGH (ref 3–12)
Neutro Abs: 6.2 10*3/uL (ref 1.7–7.7)
Neutrophils Relative %: 69 % (ref 43–77)
Platelets: 180 10*3/uL (ref 150–400)
RBC: 5.15 MIL/uL — ABNORMAL HIGH (ref 3.87–5.11)
RDW: 14.7 % (ref 11.5–15.5)
WBC: 8.9 10*3/uL (ref 4.0–10.5)

## 2014-10-28 LAB — BASIC METABOLIC PANEL
Anion gap: 22 — ABNORMAL HIGH (ref 5–15)
BUN: 80 mg/dL — ABNORMAL HIGH (ref 6–23)
CO2: 19 mmol/L (ref 19–32)
Calcium: 8.8 mg/dL (ref 8.4–10.5)
Chloride: 93 mmol/L — ABNORMAL LOW (ref 96–112)
Creatinine, Ser: 4.63 mg/dL — ABNORMAL HIGH (ref 0.50–1.10)
GFR calc Af Amer: 10 mL/min — ABNORMAL LOW (ref >90)
GFR calc non Af Amer: 9 mL/min — ABNORMAL LOW (ref >90)
Glucose, Bld: 98 mg/dL (ref 70–99)
Potassium: 3 mmol/L — ABNORMAL LOW (ref 3.5–5.1)
Sodium: 134 mmol/L — ABNORMAL LOW (ref 135–145)

## 2014-10-28 LAB — URINE MICROSCOPIC-ADD ON

## 2014-10-28 LAB — URINALYSIS, ROUTINE W REFLEX MICROSCOPIC
Glucose, UA: NEGATIVE mg/dL
Ketones, ur: 15 mg/dL — AB
Nitrite: NEGATIVE
Protein, ur: 30 mg/dL — AB
Specific Gravity, Urine: 1.025 (ref 1.005–1.030)
Urobilinogen, UA: 0.2 mg/dL (ref 0.0–1.0)
pH: 5 (ref 5.0–8.0)

## 2014-10-28 LAB — MAGNESIUM: Magnesium: 1.8 mg/dL (ref 1.5–2.5)

## 2014-10-28 LAB — MRSA PCR SCREENING: MRSA by PCR: NEGATIVE

## 2014-10-28 MED ORDER — POTASSIUM CHLORIDE CRYS ER 20 MEQ PO TBCR
40.0000 meq | EXTENDED_RELEASE_TABLET | Freq: Once | ORAL | Status: AC
  Administered 2014-10-28: 40 meq via ORAL
  Filled 2014-10-28: qty 2

## 2014-10-28 MED ORDER — SODIUM CHLORIDE 0.9 % IV BOLUS (SEPSIS)
1000.0000 mL | Freq: Once | INTRAVENOUS | Status: AC
  Administered 2014-10-28: 1000 mL via INTRAVENOUS

## 2014-10-28 MED ORDER — ONDANSETRON HCL 4 MG/2ML IJ SOLN
4.0000 mg | Freq: Once | INTRAMUSCULAR | Status: AC
  Administered 2014-10-28: 4 mg via INTRAVENOUS
  Filled 2014-10-28: qty 2

## 2014-10-28 MED ORDER — POTASSIUM CHLORIDE IN NACL 20-0.9 MEQ/L-% IV SOLN
INTRAVENOUS | Status: DC
  Administered 2014-10-28 – 2014-10-30 (×4): via INTRAVENOUS
  Administered 2014-10-30: 1 mL via INTRAVENOUS
  Administered 2014-10-31: 02:00:00 via INTRAVENOUS
  Filled 2014-10-28 (×12): qty 1000

## 2014-10-28 MED ORDER — SODIUM CHLORIDE 0.9 % IV BOLUS (SEPSIS)
500.0000 mL | Freq: Once | INTRAVENOUS | Status: AC
  Administered 2014-10-28: 500 mL via INTRAVENOUS

## 2014-10-28 MED ORDER — POTASSIUM CHLORIDE CRYS ER 20 MEQ PO TBCR: 60.0000 meq | EXTENDED_RELEASE_TABLET | Freq: Once | ORAL | Status: DC

## 2014-10-28 MED ORDER — OXYCODONE HCL 5 MG PO TABS
5.0000 mg | ORAL_TABLET | ORAL | Status: DC | PRN
  Administered 2014-10-31: 5 mg via ORAL
  Filled 2014-10-28: qty 1

## 2014-10-28 MED ORDER — BOOST / RESOURCE BREEZE PO LIQD
1.0000 | Freq: Three times a day (TID) | ORAL | Status: DC
  Administered 2014-10-28 – 2014-10-30 (×7): 1 via ORAL

## 2014-10-28 MED ORDER — HYDROMORPHONE HCL 1 MG/ML IJ SOLN
1.0000 mg | INTRAMUSCULAR | Status: DC | PRN
  Administered 2014-10-29 – 2014-10-30 (×4): 1 mg via INTRAVENOUS
  Filled 2014-10-28 (×4): qty 1

## 2014-10-28 MED ORDER — ONDANSETRON HCL 4 MG/2ML IJ SOLN
4.0000 mg | Freq: Four times a day (QID) | INTRAMUSCULAR | Status: DC | PRN
  Administered 2014-10-29 – 2014-10-30 (×4): 4 mg via INTRAVENOUS
  Filled 2014-10-28 (×4): qty 2

## 2014-10-28 MED ORDER — HEPARIN SODIUM (PORCINE) 5000 UNIT/ML IJ SOLN
5000.0000 [IU] | Freq: Three times a day (TID) | INTRAMUSCULAR | Status: DC
  Administered 2014-10-28 – 2014-11-01 (×10): 5000 [IU] via SUBCUTANEOUS
  Filled 2014-10-28 (×10): qty 1

## 2014-10-28 MED ORDER — PANTOPRAZOLE SODIUM 40 MG PO TBEC
40.0000 mg | DELAYED_RELEASE_TABLET | Freq: Every day | ORAL | Status: DC
  Administered 2014-10-28 – 2014-11-01 (×4): 40 mg via ORAL
  Filled 2014-10-28 (×4): qty 1

## 2014-10-28 MED ORDER — DEXTROSE 5 % IV SOLN
1.0000 g | INTRAVENOUS | Status: DC
  Administered 2014-10-28 – 2014-10-31 (×4): 1 g via INTRAVENOUS
  Filled 2014-10-28 (×4): qty 10

## 2014-10-28 MED ORDER — ACETAMINOPHEN 325 MG PO TABS: 650.0000 mg | ORAL_TABLET | Freq: Four times a day (QID) | ORAL | Status: DC | PRN

## 2014-10-28 MED ORDER — ACETAMINOPHEN 650 MG RE SUPP: 650.0000 mg | Freq: Four times a day (QID) | RECTAL | Status: DC | PRN

## 2014-10-28 NOTE — ED Provider Notes (Signed)
CSN: [id number]     Arrival date & time 10/28/14  4098 History   First MD Initiated Contact with Patient 10/28/14 [phone]     Chief Complaint  Patient presents with  . Abnormal Lab     (Consider location/radiation/quality/duration/timing/severity/associated sxs/prior Treatment) The history is provided by the patient and medical records.   This is a 69 year old female with past medical history significant for chronic sinusitis, asthma, GERD, hypertension, vertigo, presenting to the ED for abnormal labs. Patient was scheduled for laparoscopic cholecystectomy this morning with Dr. Derrell Lolling.  She had preop lab work performed which revealed a low potassium at 2.3. She was sent to the ED for further management. Patient does admit to continued nausea and vomiting recently due to issues with gallbladder.  Denies current abdominal pain, chest pain, or SOB.    Past Medical History  Diagnosis Date  . Other hyperalimentation   . Urticaria   . Other chronic sinusitis   . Allergic rhinitis, cause unspecified   . Unspecified asthma(493.90)   . Chronic airway obstruction, not elsewhere classified   . GERD (gastroesophageal reflux disease)   . Hypertension   . Acid reflux   . Vertigo    Past Surgical History  Procedure Laterality Date  . Total abdominal hysterectomy w/ bilateral salpingoophorectomy    . Cesarean section    . Eye surgery      DETACHED RETINA RIGHT EYE...   . Cataract extraction      RIGHT EYE  . Abdominal hysterectomy  1991    ABDOMINAL HYSTERECTOMY  leiomyomata   Family History  Problem Relation Age of Onset  . Heart failure Mother   . Heart disease Mother   . Pancreatic cancer Father   . Cancer Father 52    PANCREATIC (DECEASED)  . Diabetes Brother     DEACESED.Marland Kitchen HAD A KIDNEY TRANSPLANT  . Stroke Brother 72    DECEASED  . Breast cancer Sister 68   History  Substance Use Topics  . Smoking status: Never Smoker   . Smokeless tobacco: Never Used  . Alcohol Use: No    OB History    Gravida Para Term Preterm AB TAB SAB Ectopic Multiple Living   Review of Systems  Constitutional:       Abnormal labs  All other systems reviewed and are negative.     Allergies  Shellfish allergy and Wheat bran  Home Medications   Prior to Admission medications   Medication Sig Start Date End Date Taking? Authorizing Provider  acetaminophen (TYLENOL) 325 MG tablet Take 650 mg by mouth every 6 (six) hours as needed.      Historical Provider, MD  Azelastine-Fluticasone (DYMISTA) 137-50 MCG/ACT SUSP Place 1 Inhaler into the nose once. 05/09/14   Waymon Budge, MD  estradiol (ESTRACE) 0.5 MG tablet TAKE 1 TABLET (0.5 MG TOTAL) BY MOUTH DAILY. 07/11/14   Dara Lords, MD  fexofenadine (ALLEGRA) 180 MG tablet Take 180 mg by mouth daily.      Historical Provider, MD  fluticasone (FLONASE) 50 MCG/ACT nasal spray Place 2 sprays into both nostrils 2 (two) times daily. 03/18/14   Waymon Budge, MD  nystatin-triamcinolone ointment (MYCOLOG) Apply 1 application topically 2 (two) times daily. 09/27/13   Dara Lords, MD  olmesartan-hydrochlorothiazide (BENICAR HCT) 20-12.5 MG per tablet Take 1 tablet by mouth daily.    Historical Provider, MD  omeprazole (PRILOSEC) 40 MG capsule  Take 40 mg by mouth daily as needed.      Historical Provider, MD  PROAIR HFA 108 (90 BASE) MCG/ACT inhaler USE 2 PUFFS BY MOUTH EVERY 6 HOURS AS NEEDED FOR WHEEZING AND SHORTNESS OF BREATH 03/07/13   Waymon Budgelinton D Young, MD   BP 91/55 mmHg  Pulse 94  Temp(Src) 97.4 F (36.3 C) (Oral)  Resp 14  SpO2 99%  LMP 05/10/1990   Physical Exam  Constitutional: She is oriented to person, place, and time. She appears well-developed and well-nourished.  HENT:  Head: Normocephalic and atraumatic.  Mouth/Throat: Oropharynx is clear and moist.  Dry mucous membranes  Eyes: Conjunctivae and EOM are normal. Pupils are equal, round, and reactive to light.  Neck: Normal range of motion.   Cardiovascular: Normal rate, regular rhythm and normal heart sounds.   Pulmonary/Chest: Effort normal and breath sounds normal. No respiratory distress. She has no wheezes.  Abdominal: Soft. Bowel sounds are normal. There is no tenderness. There is no guarding.  Musculoskeletal: Normal range of motion.  Neurological: She is alert and oriented to person, place, and time.  Skin: Skin is warm and dry.  Psychiatric: She has a normal mood and affect.  Nursing note and vitals reviewed.   ED Course  Procedures (including critical care time) Labs Review Labs Reviewed  CBC WITH DIFFERENTIAL/PLATELET - Abnormal; Notable for the following:    RBC 5.15 (*)    Monocytes Relative 13 (*)    Monocytes Absolute 1.1 (*)    All other components within normal limits  BASIC METABOLIC PANEL - Abnormal; Notable for the following:    Sodium 134 (*)    Potassium 3.0 (*)    Chloride 93 (*)    BUN 80 (*)    Creatinine, Ser 4.63 (*)    GFR calc non Af Amer 9 (*)    GFR calc Af Amer 10 (*)    Anion gap 22 (*)    All other components within normal limits  MAGNESIUM    Imaging Review No results found.   EKG Interpretation None      MDM   Final diagnoses:  Renal failure  Hypokalemia  Dehydration   69 year old female here from outpatient surgery center for abnormal labs. She was found to have a potassium of 2.3. She has had continued nausea and vomiting over the past 2 weeks secondary to gallbladder issues. She currently denies any abdominal pain. She is not actively vomiting here in the ED.  Lab work was obtained with K+ of 3.0, however her creatinine is significantly elevated at 4.63 as well as elevation of BUN to 80.  Likely component of dehydration given her persistent N/V. Patient has no known history of renal impairment. She had a physical last month with her PCP and was told her labs were normal. She does admit to slightly decreased urine output recently. Patient given IV fluids. She will need  admission for renal failure.  9:53 AM Case discussed with hospitalist, Dr. Mahala MenghiniSamtani-- feels that general surgery can admit patient and re-schedule for surgery when creatinine has improved.  If have concerns, may contact him directly.  Case discussed with General surgery who has evaluated patient in the ED and will admit for further management.  Garlon HatchetLisa M Sanders, PA-C 10/28/14 1117  Garlon HatchetLisa M Sanders, PA-C 10/28/14 1118  Nelva Nayobert Beaton, MD 10/30/14 2139

## 2014-10-28 NOTE — ED Notes (Signed)
Pt reports scheduled to have gallbladder removal surgery today; reports abnormal labs of potassium 2.3. Has been vomiting.

## 2014-10-28 NOTE — Progress Notes (Signed)
UR COMPLETED  

## 2014-10-28 NOTE — ED Notes (Signed)
Pt placed on cardiac monitor, continuous BP and pulse ox

## 2014-10-28 NOTE — H&P (Signed)
Chief Complaint: acute renal failure HPI: Bonnie Ellison is a 69 year old female scheduled for a laparoscopic choelcsytectomy with Dr. Rosendo Gros today.  She was found to have a K of 2.9 and sent to [facility], but the patient arrived here.  She reports nausea and vomiting x2 weeks now.  Onset was gradual.  Coarse is unchanged.  Moderate in severity.  She has been able to keep some fluids and some PO such as yogurt.  She denies fever, chills or sweats.  Denies dysuria.  Denies diarrhea.  Her work up shows sCr 4.63, K 3, normal white count.  We have been asked to admit the patient.    Past Medical History  Diagnosis Date  . Other hyperalimentation   . Urticaria   . Other chronic sinusitis   . Allergic rhinitis, cause unspecified   . Unspecified asthma(493.90)   . Chronic airway obstruction, not elsewhere classified   . GERD (gastroesophageal reflux disease)   . Hypertension   . Acid reflux   . Vertigo     Past Surgical History  Procedure Laterality Date  . Total abdominal hysterectomy w/ bilateral salpingoophorectomy    . Cesarean section    . Eye surgery      DETACHED RETINA RIGHT EYE...   . Cataract extraction      RIGHT EYE  . Abdominal hysterectomy  1991    ABDOMINAL HYSTERECTOMY  leiomyomata    Family History  Problem Relation Age of Onset  . Heart failure Mother   . Heart disease Mother   . Pancreatic cancer Father   . Cancer Father 54    PANCREATIC (DECEASED)  . Diabetes Brother     DEACESED.Marland Kitchen HAD A KIDNEY TRANSPLANT  . Stroke Brother 29    DECEASED  . Breast cancer Sister 24   Social History:  reports that she has never smoked. She has never used smokeless tobacco. She reports that she does not drink alcohol or use illicit drugs.  Allergies:  Allergies  Allergen Reactions  . Shellfish Allergy     throat swelling, hives  . Wheat Bran Itching   Medication: Prior to Admission medications   Medication Sig Start Date End Date Taking? Authorizing Provider   acetaminophen (TYLENOL) 325 MG tablet Take 650 mg by mouth every 6 (six) hours as needed.     Yes Historical Provider, MD  fexofenadine (ALLEGRA) 180 MG tablet Take 180 mg by mouth daily.     Yes Historical Provider, MD  nystatin-triamcinolone ointment (MYCOLOG) Apply 1 application topically 2 (two) times daily. 09/27/13  Yes Anastasio Auerbach, MD  olmesartan-hydrochlorothiazide (BENICAR HCT) 40-12.5 MG per tablet Take 1 tablet by mouth daily.   Yes Historical Provider, MD  omeprazole (PRILOSEC) 40 MG capsule Take 40 mg by mouth daily as needed.     Yes Historical Provider, MD  ondansetron (ZOFRAN-ODT) 4 MG disintegrating tablet Take 4 mg by mouth every 8 (eight) hours as needed for nausea or vomiting.   Yes Historical Provider, MD  Azelastine-Fluticasone (DYMISTA) 137-50 MCG/ACT SUSP Place 1 Inhaler into the nose once. 05/09/14   Deneise Lever, MD  estradiol (ESTRACE) 0.5 MG tablet TAKE 1 TABLET (0.5 MG TOTAL) BY MOUTH DAILY. 07/11/14   Anastasio Auerbach, MD  fluticasone (FLONASE) 50 MCG/ACT nasal spray Place 2 sprays into both nostrils 2 (two) times daily. 03/18/14   Deneise Lever, MD  olmesartan-hydrochlorothiazide (BENICAR HCT) 20-12.5 MG per tablet Take 1 tablet by mouth daily.    Historical Provider, MD  PROAIR HFA 108 (90 BASE) MCG/ACT inhaler USE 2 PUFFS BY MOUTH EVERY 6 HOURS AS NEEDED FOR WHEEZING AND SHORTNESS OF BREATH 03/07/13   Deneise Lever, MD     (Not in a hospital admission)  Results for orders placed or performed during the hospital encounter of 10/28/14 (from the past 48 hour(s))  CBC with Differential     Status: Abnormal   Collection Time: 10/28/14  9:00 AM  Result Value Ref Range   WBC 8.9 4.0 - 10.5 K/uL   RBC 5.15 (H) 3.87 - 5.11 MIL/uL   Hemoglobin 14.0 12.0 - 15.0 g/dL   HCT 41.0 36.0 - 46.0 %   MCV 79.6 78.0 - 100.0 fL   MCH 27.2 26.0 - 34.0 pg   MCHC 34.1 30.0 - 36.0 g/dL   RDW 14.7 11.5 - 15.5 %   Platelets 180 150 - 400 K/uL   Neutrophils Relative %  69 43 - 77 %   Neutro Abs 6.2 1.7 - 7.7 K/uL   Lymphocytes Relative 15 12 - 46 %   Lymphs Abs 1.3 0.7 - 4.0 K/uL   Monocytes Relative 13 (H) 3 - 12 %   Monocytes Absolute 1.1 (H) 0.1 - 1.0 K/uL   Eosinophils Relative 3 0 - 5 %   Eosinophils Absolute 0.3 0.0 - 0.7 K/uL   Basophils Relative 0 0 - 1 %   Basophils Absolute 0.0 0.0 - 0.1 K/uL  Basic metabolic panel     Status: Abnormal   Collection Time: 10/28/14  9:00 AM  Result Value Ref Range   Sodium 134 (L) 135 - 145 mmol/L   Potassium 3.0 (L) 3.5 - 5.1 mmol/L   Chloride 93 (L) 96 - 112 mmol/L   CO2 19 19 - 32 mmol/L   Glucose, Bld 98 70 - 99 mg/dL   BUN 80 (H) 6 - 23 mg/dL   Creatinine, Ser 4.63 (H) 0.50 - 1.10 mg/dL   Calcium 8.8 8.4 - 10.5 mg/dL   GFR calc non Af Amer 9 (L) >90 mL/min   GFR calc Af Amer 10 (L) >90 mL/min    Comment: (NOTE) The eGFR has been calculated using the CKD EPI equation. This calculation has not been validated in all clinical situations. eGFR's persistently <90 mL/min signify possible Chronic Kidney Disease.    Anion gap 22 (H) 5 - 15  Magnesium     Status: None   Collection Time: 10/28/14  9:00 AM  Result Value Ref Range   Magnesium 1.8 1.5 - 2.5 mg/dL   No results found.  Review of Systems  All other systems reviewed and are negative.   Blood pressure 98/49, pulse 77, temperature 97.4 F (36.3 C), temperature source Oral, resp. rate 10, last menstrual period 05/10/1990, SpO2 100 %. Physical Exam  Constitutional: She appears well-developed and well-nourished. No distress.  HENT:  Head: Atraumatic.  Eyes: Left eye exhibits no discharge. No scleral icterus.  Cardiovascular: Normal rate, regular rhythm, normal heart sounds and intact distal pulses.  Exam reveals no gallop and no friction rub.   No murmur heard. Respiratory: Effort normal. No respiratory distress. She has no wheezes. She has no rales. She exhibits no tenderness.  GI: Soft. Bowel sounds are normal. She exhibits no distension  and no mass. There is no tenderness. There is no rebound and no guarding.  Skin: Skin is warm and dry. No rash noted. She is not diaphoretic. No erythema. No pallor.  Psychiatric: She has a normal mood and  affect. Her behavior is normal. Judgment and thought content normal.     Assessment/Plan Symptomatic cholelithiasis -admit -add rocephin given symptoms -pain control and anti-emetics -SCD/heparin -mobilize -laparoscopic cholecystectomy when renal failure resolves.  Acute renal failure likely 2/2 dehydration -given 1L bolus, give additional 500cc and then NS with KCL at 160m/hr -check UA -repeat labs in AM -if no improvement, will ask renal to assist -foley for accurate I&Os Hypotension 2/2 volume depletion  -give IVF -admit to SDU -hold home meds   Hodari Chuba, [email or web address]-BC 10/28/2014, 10:52 AM

## 2014-10-28 NOTE — Progress Notes (Signed)
INITIAL NUTRITION ASSESSMENT  DOCUMENTATION CODES Per approved criteria  -Non-severe (moderate) malnutrition in the context of acute illness or injury  Pt meets criteria for non severe MALNUTRITION in the context of acute illness or injury as evidenced by 11% weight loss in 3 months and <75% energy intake for >7 days.   INTERVENTION: Encourage PO  NUTRITION DIAGNOSIS: Malnutrition related to nausea/vomiting as evidenced by 11% weight loss in 3 months and <75% energy intake for >7 days.   Goal: Pt to meet >/=90% of their estimated nutrition needs  Monitor:  Diet advancement, PO, labs, weight trends  Reason for Assessment: Malnutrition Screening Tool  69 y.o. female  Admitting Dx: <principal problem not specified>  ASSESSMENT: Pt is a 69 year old female scheduled for a laparoscopic choelcsytectomy with Dr. Derrell Lollingamirez today. She was found to have a K of 2.9 [facility] but the patient arrived here. She reports nausea and vomiting x2 weeks now.  Pt resting at time of visit. She has been having gallbladder problems for some time now and last few weeks everything she ate "went through" (diarrhea). This was followed by increasing nausea and vomiting over the last couple of weeks and haven't been able to keep much of what she ate down. Pt states that she lost 20 Lb since her physical in February. Currently on clear liquids, tolerating it well. Denies need for supplements. NFPE performed, no fat or muscle tissue depletion noted. Labs reviewed: Na 134, K 3.0, Cl 93, BUN 80, Cr 4.63  Height: Ht Readings from Last 1 Encounters:  10/28/14 5' 2.5" (1.588 m)    Weight: Wt Readings from Last 1 Encounters:  10/28/14 188 lb 11.4 oz (85.6 kg)    Ideal Body Weight: 112 Lb  % Ideal Body Weight: 168%  Wt Readings from Last 10 Encounters:  10/28/14 188 lb 11.4 oz (85.6 kg)  05/09/14 223 lb 3.2 oz (101.243 kg)  01/07/14 221 lb (100.245 kg)  09/27/13 213 lb (96.616 kg)  10/09/12 221 lb  3.2 oz (100.336 kg)  08/10/12 210 lb (95.255 kg)  04/10/12 222 lb 6.4 oz (100.88 kg)  08/30/11 232 lb 3.2 oz (105.325 kg)  07/11/11 231 lb (104.781 kg)  06/28/11 232 lb 9.6 oz (105.507 kg)    Usual Body Weight: 210 Lb  % Usual Body Weight: 89%  BMI:  Body mass index is 33.94 kg/(m^2). Obese  Estimated Nutritional Needs: Kcal: 1700 - 1900 Protein: 100 - 115 g Fluid: 1.8 L  Skin: WDL  Diet Order: Diet clear liquid Room service appropriate?: Yes  EDUCATION NEEDS: -No education needs identified at this time   Intake/Output Summary (Last 24 hours) at 10/28/14 1612 Last data filed at 10/28/14 1349  Gross per 24 hour  Intake   1000 ml  Output    200 ml  Net    800 ml    Last BM: 4/15   Labs:   Recent Labs Lab 10/28/14 0900  NA 134*  K 3.0*  CL 93*  CO2 19  BUN 80*  CREATININE 4.63*  CALCIUM 8.8  MG 1.8  GLUCOSE 98    CBG (last 3)  No results for input(s): GLUCAP in the last 72 hours.  Scheduled Meds: . cefTRIAXone (ROCEPHIN)  IV  1 g Intravenous Q24H  . feeding supplement (RESOURCE BREEZE)  1 Container Oral TID BM  . heparin  5,000 Units Subcutaneous 3 times per day  . pantoprazole  40 mg Oral Daily    Continuous Infusions: .  0.9 % NaCl with KCl 20 mEq / L 125 mL/hr at 10/28/14 1222    Past Medical History  Diagnosis Date  . Other hyperalimentation   . Urticaria   . Other chronic sinusitis   . Allergic rhinitis, cause unspecified   . Unspecified asthma(493.90)   . Chronic airway obstruction, not elsewhere classified   . GERD (gastroesophageal reflux disease)   . Hypertension   . Acid reflux   . Vertigo     Past Surgical History  Procedure Laterality Date  . Total abdominal hysterectomy w/ bilateral salpingoophorectomy    . Cesarean section    . Eye surgery      DETACHED RETINA RIGHT EYE...   . Cataract extraction      RIGHT EYE  . Abdominal hysterectomy  1991    ABDOMINAL HYSTERECTOMY  leiomyomata    Xandria Gallaga A. [facility] Dietetic  Intern Pager: [phone] 10/28/2014 4:21 PM

## 2014-10-29 LAB — BASIC METABOLIC PANEL
Anion gap: 11 (ref 5–15)
BUN: 53 mg/dL — ABNORMAL HIGH (ref 6–23)
CO2: 21 mmol/L (ref 19–32)
Calcium: 8.2 mg/dL — ABNORMAL LOW (ref 8.4–10.5)
Chloride: 105 mmol/L (ref 96–112)
Creatinine, Ser: 2.09 mg/dL — ABNORMAL HIGH (ref 0.50–1.10)
GFR calc Af Amer: 27 mL/min — ABNORMAL LOW (ref >90)
GFR calc non Af Amer: 23 mL/min — ABNORMAL LOW (ref >90)
Glucose, Bld: 80 mg/dL (ref 70–99)
Potassium: 3.9 mmol/L (ref 3.5–5.1)
Sodium: 137 mmol/L (ref 135–145)

## 2014-10-29 LAB — CBC
HCT: 32.2 % — ABNORMAL LOW (ref 36.0–46.0)
Hemoglobin: 10.8 g/dL — ABNORMAL LOW (ref 12.0–15.0)
MCH: 27.2 pg (ref 26.0–34.0)
MCHC: 33.5 g/dL (ref 30.0–36.0)
MCV: 81.1 fL (ref 78.0–100.0)
Platelets: 149 10*3/uL — ABNORMAL LOW (ref 150–400)
RBC: 3.97 MIL/uL (ref 3.87–5.11)
RDW: 14.9 % (ref 11.5–15.5)
WBC: 6.7 10*3/uL (ref 4.0–10.5)

## 2014-10-29 MED ORDER — WHITE PETROLATUM GEL
Status: AC
  Administered 2014-10-29: 21:00:00 via ORAL
  Filled 2014-10-29: qty 1

## 2014-10-29 MED ORDER — SODIUM CHLORIDE 0.9 % IV BOLUS (SEPSIS)
1000.0000 mL | Freq: Once | INTRAVENOUS | Status: AC
  Administered 2014-10-29: 1000 mL via INTRAVENOUS

## 2014-10-29 NOTE — Progress Notes (Signed)
MD paged notified of low Blood pressure orders received

## 2014-10-29 NOTE — Progress Notes (Signed)
Called report to Lisette GrinderVassie Cain, RN on 6N. Vitals stable. Belongings with patient. ELink and CCMD notified.

## 2014-10-29 NOTE — Progress Notes (Signed)
  Subjective: Feels much better - no abdominal pain Tolerating clears Good UOP  Objective: Vital signs in last 24 hours: Temp:  [97.7 F (36.5 C)-98.1 F (36.7 C)] 97.7 F (36.5 C) (04/16 0803) Pulse Rate:  [62-85] 66 (04/16 0730) Resp:  [10-16] 11 (04/16 0730) BP: (76-119)/(37-100) 90/51 mmHg (04/16 0730) SpO2:  [97 %-100 %] 100 % (04/16 0730) Weight:  [85.6 kg (188 lb 11.4 oz)] 85.6 kg (188 lb 11.4 oz) (04/15 1200)    Intake/Output from previous day: 04/15 0701 - 04/16 0700 In: 3695 [P.O.:320; I.V.:2375; IV Piggyback:1000] Out: 1450 [Urine:1450] Intake/Output this shift: Total I/O In: 485 [P.O.:360; I.V.:125] Out: 450 [Urine:450]  General appearance: alert, cooperative and no distress Resp: clear to auscultation bilaterally Cardio: regular rate and rhythm, S1, S2 normal, no murmur, click, rub or gallop GI: soft, non-tender; bowel sounds normal; no masses,  no organomegaly  Lab Results:   Recent Labs  10/28/14 0900 10/29/14 0306  WBC 8.9 6.7  HGB 14.0 10.8*  HCT 41.0 32.2*  PLT 180 149*   BMET  Recent Labs  10/28/14 0900 10/29/14 0306  NA 134* 137  K 3.0* 3.9  CL 93* 105  CO2 19 21  GLUCOSE 98 80  BUN 80* 53*  CREATININE 4.63* 2.09*  CALCIUM 8.8 8.2*   No flowsheet data found.   PT/INR No results for input(s): LABPROT, INR in the last 72 hours. ABG No results for input(s): PHART, HCO3 in the last 72 hours.  Invalid input(s): PCO2, PO2  Studies/Results: No results found.  Anti-infectives: Anti-infectives    Start     Dose/Rate Route Frequency Ordered Stop   10/28/14 1100  cefTRIAXone (ROCEPHIN) 1 g in dextrose 5 % 50 mL IVPB     1 g 100 mL/hr over 30 Minutes Intravenous Every 24 hours 10/28/14 1053        Assessment/Plan: Symptomatic cholelithiasis -add rocephin given symptoms - now asymptomatic -pain control and anti-emetics -SCD/heparin -mobilize -laparoscopic cholecystectomy when renal failure resolves.  Acute renal failure  likely 2/2 dehydration Improving with aggressive rehydration -foley for accurate I&Os Transfer to floor - surgery probably Monday   LOS: 1 day    Keny Donald K. 10/29/2014

## 2014-10-30 LAB — COMPREHENSIVE METABOLIC PANEL
ALT: 30 U/L (ref 0–35)
AST: 35 U/L (ref 0–37)
Albumin: 2.7 g/dL — ABNORMAL LOW (ref 3.5–5.2)
Alkaline Phosphatase: 42 U/L (ref 39–117)
Anion gap: 9 (ref 5–15)
BUN: 24 mg/dL — ABNORMAL HIGH (ref 6–23)
CO2: 17 mmol/L — ABNORMAL LOW (ref 19–32)
Calcium: 8.8 mg/dL (ref 8.4–10.5)
Chloride: 117 mmol/L — ABNORMAL HIGH (ref 96–112)
Creatinine, Ser: 1.12 mg/dL — ABNORMAL HIGH (ref 0.50–1.10)
GFR calc Af Amer: 58 mL/min — ABNORMAL LOW (ref >90)
GFR calc non Af Amer: 50 mL/min — ABNORMAL LOW (ref >90)
Glucose, Bld: 104 mg/dL — ABNORMAL HIGH (ref 70–99)
Potassium: 3.6 mmol/L (ref 3.5–5.1)
Sodium: 143 mmol/L (ref 135–145)
Total Bilirubin: 0.5 mg/dL (ref 0.3–1.2)
Total Protein: 5.4 g/dL — ABNORMAL LOW (ref 6.0–8.3)

## 2014-10-30 LAB — SURGICAL PCR SCREEN
MRSA, PCR: NEGATIVE
Staphylococcus aureus: NEGATIVE

## 2014-10-30 LAB — CBC
HCT: 35 % — ABNORMAL LOW (ref 36.0–46.0)
Hemoglobin: 11.5 g/dL — ABNORMAL LOW (ref 12.0–15.0)
MCH: 27.3 pg (ref 26.0–34.0)
MCHC: 32.9 g/dL (ref 30.0–36.0)
MCV: 83.1 fL (ref 78.0–100.0)
Platelets: 153 10*3/uL (ref 150–400)
RBC: 4.21 MIL/uL (ref 3.87–5.11)
RDW: 15.7 % — ABNORMAL HIGH (ref 11.5–15.5)
WBC: 6.3 10*3/uL (ref 4.0–10.5)

## 2014-10-30 NOTE — Progress Notes (Signed)
Pt critical lab - creatinine 1.12 Dr. Harden MoMatt Wakefield informed no new order made at this time.

## 2014-10-30 NOTE — Progress Notes (Signed)
Patient ID: Bonnie Ellison, female   DOB: 04-17-1946, 69 y.o.   MRN: [id number]     [address] [place], [place] [address]    Phone: [phone] FAX: [phone]     Subjective: Good UOP. Foley out. Still has n/v.  Passing flatus.  Tolerating clears. Ambulating.    Objective:  Vital signs:  Filed Vitals:   10/29/14 1621 10/29/14 1706 10/29/14 2233 10/30/14 0522  BP:  107/60 118/64 118/81  Pulse:  76 83 83  Temp: 98 F (36.7 C) 98.2 F (36.8 C) 98.3 F (36.8 C) 97.5 F (36.4 C)  TempSrc: Oral Oral Oral Oral  Resp:  '16 16 16  ' Height:      Weight:      SpO2:  100% 99% 99%    Last BM Date: 10/30/14  Intake/Output   Yesterday:  04/16 0701 - 04/17 0700 In: 4268 [P.O.:1920; I.V.:1250] Out: 1475 [Urine:1275; Emesis/NG output:200] This shift:  Total I/O In: -  Out: 200 [Urine:200]   Physical Exam: General: Pt awake/alert/oriented x4 in no acute distress Chest: cta. No chest wall pain w good excursion CV:  Pulses intact.  Regular rhythm Abdomen: Soft.  Nondistended. TTP RUQ.   No evidence of peritonitis.  No incarcerated hernias. Ext:  SCDs BLE.  No mjr edema.  No cyanosis Skin: No petechiae / purpura   Problem List:   Active Problems:   Symptomatic cholelithiasis    Results:   Labs: Results for orders placed or performed during the hospital encounter of 10/28/14 (from the past 48 hour(s))  MRSA PCR Screening     Status: None   Collection Time: 10/28/14 12:16 PM  Result Value Ref Range   MRSA by PCR NEGATIVE NEGATIVE    Comment:        The GeneXpert MRSA Assay (FDA approved for NASAL specimens only), is one component of a comprehensive MRSA colonization surveillance program. It is not intended to diagnose MRSA infection nor to guide or monitor treatment for MRSA infections.   Urinalysis, Routine w reflex microscopic     Status: Abnormal   Collection Time: 10/28/14 12:41 PM   Result Value Ref Range   Color, Urine YELLOW YELLOW   APPearance HAZY (A) CLEAR   Specific Gravity, Urine 1.025 1.005 - 1.030   pH 5.0 5.0 - 8.0   Glucose, UA NEGATIVE NEGATIVE mg/dL   Hgb urine dipstick TRACE (A) NEGATIVE   Bilirubin Urine LARGE (A) NEGATIVE   Ketones, ur 15 (A) NEGATIVE mg/dL   Protein, ur 30 (A) NEGATIVE mg/dL   Urobilinogen, UA 0.2 0.0 - 1.0 mg/dL   Nitrite NEGATIVE NEGATIVE   Leukocytes, UA SMALL (A) NEGATIVE  Urine microscopic-add on     Status: Abnormal   Collection Time: 10/28/14 12:41 PM  Result Value Ref Range   Squamous Epithelial / LPF FEW (A) RARE   WBC, UA 7-10 <3 WBC/hpf   RBC / HPF 0-2 <3 RBC/hpf   Bacteria, UA FEW (A) RARE   Casts GRANULAR CAST (A) NEGATIVE    Comment: HYALINE CASTS   Urine-Other MUCOUS PRESENT   Basic metabolic panel     Status: Abnormal   Collection Time: 10/29/14  3:06 AM  Result Value Ref Range   Sodium 137 135 - 145 mmol/L   Potassium 3.9 3.5 - 5.1 mmol/L    Comment: DELTA CHECK NOTED   Chloride 105 96 - 112 mmol/L   CO2 21  19 - 32 mmol/L   Glucose, Bld 80 70 - 99 mg/dL   BUN 53 (H) 6 - 23 mg/dL    Comment: DELTA CHECK NOTED   Creatinine, Ser 2.09 (H) 0.50 - 1.10 mg/dL    Comment: DELTA CHECK NOTED   Calcium 8.2 (L) 8.4 - 10.5 mg/dL   GFR calc non Af Amer 23 (L) >90 mL/min   GFR calc Af Amer 27 (L) >90 mL/min    Comment: (NOTE) The eGFR has been calculated using the CKD EPI equation. This calculation has not been validated in all clinical situations. eGFR's persistently <90 mL/min signify possible Chronic Kidney Disease.    Anion gap 11 5 - 15  CBC     Status: Abnormal   Collection Time: 10/29/14  3:06 AM  Result Value Ref Range   WBC 6.7 4.0 - 10.5 K/uL   RBC 3.97 3.87 - 5.11 MIL/uL   Hemoglobin 10.8 (L) 12.0 - 15.0 g/dL    Comment: REPEATED TO VERIFY   HCT 32.2 (L) 36.0 - 46.0 %   MCV 81.1 78.0 - 100.0 fL   MCH 27.2 26.0 - 34.0 pg   MCHC 33.5 30.0 - 36.0 g/dL   RDW 14.9 11.5 - 15.5 %   Platelets 149  (L) 150 - 400 K/uL  CBC     Status: Abnormal   Collection Time: 10/30/14  4:24 AM  Result Value Ref Range   WBC 6.3 4.0 - 10.5 K/uL   RBC 4.21 3.87 - 5.11 MIL/uL   Hemoglobin 11.5 (L) 12.0 - 15.0 g/dL   HCT 35.0 (L) 36.0 - 46.0 %   MCV 83.1 78.0 - 100.0 fL   MCH 27.3 26.0 - 34.0 pg   MCHC 32.9 30.0 - 36.0 g/dL   RDW 15.7 (H) 11.5 - 15.5 %   Platelets 153 150 - 400 K/uL  Comprehensive metabolic panel     Status: Abnormal   Collection Time: 10/30/14  4:24 AM  Result Value Ref Range   Sodium 143 135 - 145 mmol/L   Potassium 3.6 3.5 - 5.1 mmol/L   Chloride 117 (H) 96 - 112 mmol/L    Comment: DELTA CHECK NOTED   CO2 17 (L) 19 - 32 mmol/L   Glucose, Bld 104 (H) 70 - 99 mg/dL   BUN 24 (H) 6 - 23 mg/dL    Comment: DELTA CHECK NOTED   Creatinine, Ser 1.12 (H) 0.50 - 1.10 mg/dL    Comment: RESULT CALLED TO, READ BACK BY AND VERIFIED WITH: Nat Christen RN [phone] GREEN R    Calcium 8.8 8.4 - 10.5 mg/dL   Total Protein 5.4 (L) 6.0 - 8.3 g/dL   Albumin 2.7 (L) 3.5 - 5.2 g/dL   AST 35 0 - 37 U/L   ALT 30 0 - 35 U/L   Alkaline Phosphatase 42 39 - 117 U/L   Total Bilirubin 0.5 0.3 - 1.2 mg/dL   GFR calc non Af Amer 50 (L) >90 mL/min   GFR calc Af Amer 58 (L) >90 mL/min    Comment: (NOTE) The eGFR has been calculated using the CKD EPI equation. This calculation has not been validated in all clinical situations. eGFR's persistently <90 mL/min signify possible Chronic Kidney Disease.    Anion gap 9 5 - 15    Imaging / Studies: No results found.  Medications / Allergies:  Scheduled Meds: . cefTRIAXone (ROCEPHIN)  IV  1 g Intravenous Q24H  . feeding supplement (RESOURCE BREEZE)  1 Container  Oral TID BM  . heparin  5,000 Units Subcutaneous 3 times per day  . pantoprazole  40 mg Oral Daily   Continuous Infusions: . 0.9 % NaCl with KCl 20 mEq / L 125 mL/hr at 10/30/14 0918   PRN Meds:.acetaminophen **OR** acetaminophen, HYDROmorphone (DILAUDID) injection, ondansetron,  oxyCODONE  Antibiotics: Anti-infectives    Start     Dose/Rate Route Frequency Ordered Stop   10/28/14 1100  cefTRIAXone (ROCEPHIN) 1 g in dextrose 5 % 50 mL IVPB     1 g 100 mL/hr over 30 Minutes Intravenous Every 24 hours 10/28/14 1053         Assessment/Plan: Symptomatic cholelithiasis -rocephin d/t symptoms -pain control and anti-emetics -SCD/heparin -mobilize -laparoscopic cholecystectomy tomorrow -clears, NPO after midnight Acute renal failure likely 2/2 dehydration -Improving with aggressive rehydration -I&Os -repeat BMP in AM HTN/hypotension  -continue to hold home meds  Erby Pian, [facility] Pager [phone] For consults and floor pages call [phone]  10/30/2014 10:00 AM

## 2014-10-31 ENCOUNTER — Encounter (HOSPITAL_COMMUNITY): Admission: EM | Disposition: A | Discharge status: Home / Self Care | Payer: Self-pay | Source: Home / Self Care

## 2014-10-31 ENCOUNTER — Encounter (HOSPITAL_COMMUNITY): Payer: Self-pay | Admitting: Anesthesiology

## 2014-10-31 ENCOUNTER — Inpatient Hospital Stay (HOSPITAL_COMMUNITY): Payer: Medicare Other | Admitting: Anesthesiology

## 2014-10-31 DIAGNOSIS — E440 Moderate protein-calorie malnutrition: Secondary | ICD-10-CM | POA: Insufficient documentation

## 2014-10-31 HISTORY — PX: CHOLECYSTECTOMY: SHX55

## 2014-10-31 LAB — BASIC METABOLIC PANEL
Anion gap: 7 (ref 5–15)
BUN: 12 mg/dL (ref 6–23)
CO2: 14 mmol/L — ABNORMAL LOW (ref 19–32)
Calcium: 8.5 mg/dL (ref 8.4–10.5)
Chloride: 121 mmol/L — ABNORMAL HIGH (ref 96–112)
Creatinine, Ser: 1.04 mg/dL (ref 0.50–1.10)
GFR calc Af Amer: 63 mL/min — ABNORMAL LOW (ref >90)
GFR calc non Af Amer: 54 mL/min — ABNORMAL LOW (ref >90)
Glucose, Bld: 100 mg/dL — ABNORMAL HIGH (ref 70–99)
Potassium: 3.4 mmol/L — ABNORMAL LOW (ref 3.5–5.1)
Sodium: 142 mmol/L (ref 135–145)

## 2014-10-31 SURGERY — LAPAROSCOPIC CHOLECYSTECTOMY
Anesthesia: General | Site: Abdomen

## 2014-10-31 MED ORDER — CEFTRIAXONE SODIUM IN DEXTROSE 20 MG/ML IV SOLN
1.0000 g | INTRAVENOUS | Status: AC
  Administered 2014-11-01: 1 g via INTRAVENOUS
  Filled 2014-10-31: qty 50

## 2014-10-31 MED ORDER — OXYCODONE HCL 5 MG PO TABS: 5.0000 mg | ORAL_TABLET | Freq: Once | ORAL | Status: DC | PRN

## 2014-10-31 MED ORDER — DEXAMETHASONE SODIUM PHOSPHATE 4 MG/ML IJ SOLN
INTRAMUSCULAR | Status: AC
  Filled 2014-10-31: qty 1

## 2014-10-31 MED ORDER — BUPIVACAINE-EPINEPHRINE 0.25% -1:200000 IJ SOLN
INTRAMUSCULAR | Status: DC | PRN
  Administered 2014-10-31: 30 mL

## 2014-10-31 MED ORDER — BUPIVACAINE-EPINEPHRINE (PF) 0.25% -1:200000 IJ SOLN
INTRAMUSCULAR | Status: AC
  Filled 2014-10-31: qty 30

## 2014-10-31 MED ORDER — SUGAMMADEX SODIUM 200 MG/2ML IV SOLN
INTRAVENOUS | Status: DC | PRN
  Administered 2014-10-31: 150 mg via INTRAVENOUS

## 2014-10-31 MED ORDER — NEOSTIGMINE METHYLSULFATE 10 MG/10ML IV SOLN
INTRAVENOUS | Status: DC | PRN
  Administered 2014-10-31: 5 mg via INTRAVENOUS

## 2014-10-31 MED ORDER — OXYCODONE HCL 5 MG/5ML PO SOLN: 5.0000 mg | Freq: Once | ORAL | Status: DC | PRN

## 2014-10-31 MED ORDER — GLYCOPYRROLATE 0.2 MG/ML IJ SOLN
INTRAMUSCULAR | Status: AC
  Filled 2014-10-31: qty 8

## 2014-10-31 MED ORDER — ARTIFICIAL TEARS OP OINT
TOPICAL_OINTMENT | OPHTHALMIC | Status: AC
  Filled 2014-10-31: qty 10.5

## 2014-10-31 MED ORDER — ROCURONIUM BROMIDE 100 MG/10ML IV SOLN
INTRAVENOUS | Status: DC | PRN
  Administered 2014-10-31: 30 mg via INTRAVENOUS

## 2014-10-31 MED ORDER — ALBUTEROL SULFATE HFA 108 (90 BASE) MCG/ACT IN AERS
INHALATION_SPRAY | RESPIRATORY_TRACT | Status: DC | PRN
  Administered 2014-10-31: 4 via RESPIRATORY_TRACT

## 2014-10-31 MED ORDER — HEMOSTATIC AGENTS (NO CHARGE) OPTIME
TOPICAL | Status: DC | PRN
  Administered 2014-10-31: 1 via TOPICAL

## 2014-10-31 MED ORDER — SUGAMMADEX SODIUM 200 MG/2ML IV SOLN
INTRAVENOUS | Status: AC
  Filled 2014-10-31: qty 2

## 2014-10-31 MED ORDER — SCOPOLAMINE 1 MG/3DAYS TD PT72
MEDICATED_PATCH | TRANSDERMAL | Status: DC | PRN
  Administered 2014-10-31: 1 via TRANSDERMAL

## 2014-10-31 MED ORDER — LACTATED RINGERS IV SOLN
INTRAVENOUS | Status: DC
  Administered 2014-10-31 (×3): via INTRAVENOUS

## 2014-10-31 MED ORDER — EPHEDRINE SULFATE 50 MG/ML IJ SOLN
INTRAMUSCULAR | Status: AC
  Filled 2014-10-31: qty 1

## 2014-10-31 MED ORDER — POTASSIUM CHLORIDE IN NACL 20-0.9 MEQ/L-% IV SOLN
INTRAVENOUS | Status: DC
  Administered 2014-10-31 – 2014-11-01 (×2): via INTRAVENOUS
  Filled 2014-10-31 (×4): qty 1000

## 2014-10-31 MED ORDER — MIDAZOLAM HCL 5 MG/5ML IJ SOLN
INTRAMUSCULAR | Status: DC | PRN
  Administered 2014-10-31 (×2): 1 mg via INTRAVENOUS

## 2014-10-31 MED ORDER — SUCCINYLCHOLINE CHLORIDE 20 MG/ML IJ SOLN
INTRAMUSCULAR | Status: DC | PRN
  Administered 2014-10-31: 120 mg via INTRAVENOUS

## 2014-10-31 MED ORDER — ROCURONIUM BROMIDE 50 MG/5ML IV SOLN
INTRAVENOUS | Status: AC
  Filled 2014-10-31: qty 1

## 2014-10-31 MED ORDER — LIDOCAINE HCL (CARDIAC) 20 MG/ML IV SOLN
INTRAVENOUS | Status: DC | PRN
  Administered 2014-10-31: 100 mg via INTRAVENOUS

## 2014-10-31 MED ORDER — FENTANYL CITRATE (PF) 100 MCG/2ML IJ SOLN
INTRAMUSCULAR | Status: DC | PRN
  Administered 2014-10-31: 100 ug via INTRAVENOUS
  Administered 2014-10-31: 50 ug via INTRAVENOUS
  Administered 2014-10-31: 25 ug via INTRAVENOUS

## 2014-10-31 MED ORDER — ARTIFICIAL TEARS OP OINT
TOPICAL_OINTMENT | OPHTHALMIC | Status: DC | PRN
Start: 2014-10-31 — End: 2014-10-31
  Administered 2014-10-31: 1 via OPHTHALMIC

## 2014-10-31 MED ORDER — NEOSTIGMINE METHYLSULFATE 10 MG/10ML IV SOLN
INTRAVENOUS | Status: AC
  Filled 2014-10-31: qty 1

## 2014-10-31 MED ORDER — ONDANSETRON HCL 4 MG/2ML IJ SOLN
INTRAMUSCULAR | Status: AC
  Filled 2014-10-31: qty 4

## 2014-10-31 MED ORDER — DEXAMETHASONE SODIUM PHOSPHATE 4 MG/ML IJ SOLN
INTRAMUSCULAR | Status: DC | PRN
  Administered 2014-10-31: 4 mg via INTRAVENOUS

## 2014-10-31 MED ORDER — DEXTROSE 5 % IV SOLN
10.0000 mg | INTRAVENOUS | Status: DC | PRN
  Administered 2014-10-31: 20 ug/min via INTRAVENOUS

## 2014-10-31 MED ORDER — SODIUM CHLORIDE 0.9 % IR SOLN
Status: DC | PRN
  Administered 2014-10-31 (×2): 1000 mL

## 2014-10-31 MED ORDER — MIDAZOLAM HCL 2 MG/2ML IJ SOLN
INTRAMUSCULAR | Status: AC
  Filled 2014-10-31: qty 2

## 2014-10-31 MED ORDER — PROMETHAZINE HCL 25 MG/ML IJ SOLN: 6.2500 mg | INTRAMUSCULAR | Status: DC | PRN

## 2014-10-31 MED ORDER — SUCCINYLCHOLINE CHLORIDE 20 MG/ML IJ SOLN
INTRAMUSCULAR | Status: AC
  Filled 2014-10-31: qty 1

## 2014-10-31 MED ORDER — SODIUM CHLORIDE 0.9 % IJ SOLN
INTRAMUSCULAR | Status: AC
  Filled 2014-10-31: qty 10

## 2014-10-31 MED ORDER — 0.9 % SODIUM CHLORIDE (POUR BTL) OPTIME
TOPICAL | Status: DC | PRN
  Administered 2014-10-31: 1000 mL

## 2014-10-31 MED ORDER — HYDROMORPHONE HCL 1 MG/ML IJ SOLN: 0.2500 mg | INTRAMUSCULAR | Status: DC | PRN

## 2014-10-31 MED ORDER — GLYCOPYRROLATE 0.2 MG/ML IJ SOLN
INTRAMUSCULAR | Status: DC | PRN
  Administered 2014-10-31: 0.6 mg via INTRAVENOUS

## 2014-10-31 MED ORDER — PROPOFOL 10 MG/ML IV BOLUS
INTRAVENOUS | Status: DC | PRN
  Administered 2014-10-31: 170 mg via INTRAVENOUS

## 2014-10-31 MED ORDER — HYDROMORPHONE HCL 1 MG/ML IJ SOLN
INTRAMUSCULAR | Status: AC
  Filled 2014-10-31: qty 1

## 2014-10-31 MED ORDER — SCOPOLAMINE 1 MG/3DAYS TD PT72
MEDICATED_PATCH | TRANSDERMAL | Status: AC
  Filled 2014-10-31: qty 1

## 2014-10-31 MED ORDER — ONDANSETRON HCL 4 MG/2ML IJ SOLN
INTRAMUSCULAR | Status: DC | PRN
  Administered 2014-10-31: 4 mg via INTRAVENOUS

## 2014-10-31 MED ORDER — FENTANYL CITRATE (PF) 250 MCG/5ML IJ SOLN
INTRAMUSCULAR | Status: AC
  Filled 2014-10-31: qty 5

## 2014-10-31 MED ORDER — LIDOCAINE HCL (CARDIAC) 20 MG/ML IV SOLN
INTRAVENOUS | Status: AC
  Filled 2014-10-31: qty 10

## 2014-10-31 SURGICAL SUPPLY — 48 items
APPLIER CLIP 5 13 M/L LIGAMAX5 (MISCELLANEOUS) ×2
APR CLP MED LRG 5 ANG JAW (MISCELLANEOUS) ×1
BAG SPEC RTRVL 10 TROC 200 (ENDOMECHANICALS) ×1
BLADE SURG ROTATE 9660 (MISCELLANEOUS) IMPLANT
CANISTER SUCTION 2500CC (MISCELLANEOUS) ×2 IMPLANT
CHLORAPREP W/TINT 26ML (MISCELLANEOUS) ×2 IMPLANT
CLIP APPLIE 5 13 M/L LIGAMAX5 (MISCELLANEOUS) ×1 IMPLANT
COVER SURGICAL LIGHT HANDLE (MISCELLANEOUS) ×2 IMPLANT
DEVICE TROCAR PUNCTURE CLOSURE (ENDOMECHANICALS) ×1 IMPLANT
DRAPE LAPAROSCOPIC ABDOMINAL (DRAPES) ×2 IMPLANT
DRAPE PROXIMA HALF (DRAPES) ×1 IMPLANT
ELECT REM PT RETURN 9FT ADLT (ELECTROSURGICAL) ×2
ELECTRODE REM PT RTRN 9FT ADLT (ELECTROSURGICAL) ×1 IMPLANT
GLOVE BIO SURGEON STRL SZ7 (GLOVE) ×3 IMPLANT
GLOVE BIOGEL PI IND STRL 7.5 (GLOVE) ×1 IMPLANT
GLOVE BIOGEL PI IND STRL 8 (GLOVE) IMPLANT
GLOVE BIOGEL PI INDICATOR 7.5 (GLOVE) ×2
GLOVE BIOGEL PI INDICATOR 8 (GLOVE) ×2
GLOVE BIOGEL PI ORTHO PRO SZ7 (GLOVE) ×1
GLOVE ECLIPSE 7.0 STRL STRAW (GLOVE) ×2 IMPLANT
GLOVE ECLIPSE 7.5 STRL STRAW (GLOVE) ×2 IMPLANT
GLOVE PI ORTHO PRO STRL SZ7 (GLOVE) IMPLANT
GLOVE SURG SS PI 6.5 STRL IVOR (GLOVE) ×1 IMPLANT
GLOVE SURG SS PI 7.0 STRL IVOR (GLOVE) ×1 IMPLANT
GOWN STRL REUS W/ TWL LRG LVL3 (GOWN DISPOSABLE) ×3 IMPLANT
GOWN STRL REUS W/TWL LRG LVL3 (GOWN DISPOSABLE) ×10
HEMOSTAT SNOW SURGICEL 2X4 (HEMOSTASIS) ×1 IMPLANT
KIT BASIN OR (CUSTOM PROCEDURE TRAY) ×2 IMPLANT
KIT ROOM TURNOVER OR (KITS) ×2 IMPLANT
LIQUID BAND (GAUZE/BANDAGES/DRESSINGS) ×2 IMPLANT
NS IRRIG 1000ML POUR BTL (IV SOLUTION) ×2 IMPLANT
PAD ARMBOARD 7.5X6 YLW CONV (MISCELLANEOUS) ×2 IMPLANT
POUCH RETRIEVAL ECOSAC 10 (ENDOMECHANICALS) ×1 IMPLANT
POUCH RETRIEVAL ECOSAC 10MM (ENDOMECHANICALS) ×1
SCISSORS LAP 5X35 DISP (ENDOMECHANICALS) ×2 IMPLANT
SET IRRIG TUBING LAPAROSCOPIC (IRRIGATION / IRRIGATOR) ×2 IMPLANT
SLEEVE ENDOPATH XCEL 5M (ENDOMECHANICALS) ×4 IMPLANT
SPECIMEN JAR SMALL (MISCELLANEOUS) ×2 IMPLANT
STRIP CLOSURE SKIN 1/2X4 (GAUZE/BANDAGES/DRESSINGS) ×1 IMPLANT
SUT MNCRL AB 4-0 PS2 18 (SUTURE) ×2 IMPLANT
SUT VICRYL 0 UR6 27IN ABS (SUTURE) ×2 IMPLANT
TOWEL OR 17X24 6PK STRL BLUE (TOWEL DISPOSABLE) ×2 IMPLANT
TOWEL OR 17X26 10 PK STRL BLUE (TOWEL DISPOSABLE) ×1 IMPLANT
TRAY LAPAROSCOPIC (CUSTOM PROCEDURE TRAY) ×2 IMPLANT
TROCAR XCEL BLUNT TIP 100MML (ENDOMECHANICALS) ×2 IMPLANT
TROCAR XCEL NON-BLD 11X100MML (ENDOMECHANICALS) ×1 IMPLANT
TROCAR XCEL NON-BLD 5MMX100MML (ENDOMECHANICALS) ×2 IMPLANT
TUBING INSUFFLATION (TUBING) ×2 IMPLANT

## 2014-10-31 NOTE — Anesthesia Preprocedure Evaluation (Addendum)
Anesthesia Evaluation  Patient identified by MRN, date of birth, ID band Patient awake    Reviewed: Allergy & Precautions, NPO status , Patient's Chart, lab work & pertinent test results  History of Anesthesia Complications Negative for: history of anesthetic complications  Airway Mallampati: II  TM Distance: >3 FB Neck ROM: Full    Dental  (+) Poor Dentition, Dental Advisory Given   Pulmonary asthma , COPD COPD inhaler,    Pulmonary exam normal       Cardiovascular hypertension,     Neuro/Psych negative neurological ROS     GI/Hepatic Neg liver ROS, GERD-  ,  Endo/Other  negative endocrine ROS  Renal/GU negative Renal ROS     Musculoskeletal   Abdominal   Peds  Hematology   Anesthesia Other Findings   Reproductive/Obstetrics                            Anesthesia Physical Anesthesia Plan  ASA: II  Anesthesia Plan: General   Post-op Pain Management:    Induction: Intravenous  Airway Management Planned: Oral ETT  Additional Equipment:   Intra-op Plan:   Post-operative Plan: Extubation in OR  Informed Consent: I have reviewed the patients History and Physical, chart, labs and discussed the procedure including the risks, benefits and alternatives for the proposed anesthesia with the patient or authorized representative who has indicated his/her understanding and acceptance.   Dental advisory given  Plan Discussed with: CRNA, Anesthesiologist and Surgeon  Anesthesia Plan Comments:        Anesthesia Quick Evaluation

## 2014-10-31 NOTE — Op Note (Signed)
Preoperative diagnosis: acute cholecystitis Postoperative diagnosis: same as above Procedure: laparoscopic cholecystectomy Surgeon: Dr Harden MoMatt Asheton Viramontes Asst: Barnetta ChapelKelly Osborne, PA-C Anesthesia: general EBL: minimal Drains none Specimen gb and contents to pathology Complications: none Sponge count correct at completion Disposition to recovery stable  Indications: This is a [age] yof who was scheduled to undergo lap cholecystectomy by Dr Derrell Lollingamirez and has been having emesis. Her preop labs were consistent with dehydration and acute renal insufficiency.  She was admitted, hydrated and her cr returned to normal. We then discussed proceeding with her laparoscopic cholecystectomy.  Procedure: After informed consent was obtained the patient was taken to the operating room. She was on antibiotics. Sequential compression devices were on her legs. She was placed under general anesthesia without complication. Her abdomen was prepped and draped in the standard sterile surgical fashion. A surgical timeout was then performed.  I infiltrated marcaine in her left upper quadrant. I made an incision and entered the peritoneum under direct vision without complication. I inserted a 5 mm trocar and insufflated the abdomen There were adhesions in the lower abdomen from her prior hysterectomy. I then inserted an epigastric trocar and two right sided 5 mm trocars.I placed an 11 mm trocar above her prior incision.Once I grasped it the gallbladder spontaneously drained old bile. I cleared some adherent omentum off of it. She did have acute cholecystitis. I was able to with blunt dissection free the gallbladder. I then was able to identify the cystic duct and clearly had the critical view of safety.I then clipped the cystic duct and divided it. The duct was viable and the clips traversed the duct. I then treated the artery in similar fashion. I then removed the gallbladder from the liver bed and placed it in a bag. It was then  removed from the midline incision. I then obtained hemostasis and irrigated.  I then desufflated the abdomen and removed all my trocars. I did close the midline incision with several 0 vicryl sutures andthe endoclose device after placing several interrupted 0 vicryl sutures through this. I then close these with 4-0 Monocryl and Dermabond. She tolerated this well was extubated and transferred to the recovery room in stable condition.

## 2014-10-31 NOTE — Transfer of Care (Signed)
Immediate Anesthesia Transfer of Care Note  Patient: Bonnie Ellison  Procedure(s) Performed: Procedure(s): LAPAROSCOPIC CHOLECYSTECTOMY (N/A)  Patient Location: PACU  Anesthesia Type:General  Level of Consciousness: awake and alert   Airway & Oxygen Therapy: Patient Spontanous Breathing and Patient connected to nasal cannula oxygen  Post-op Assessment: Report given to RN, Post -op Vital signs reviewed and stable and Patient moving all extremities  Post vital signs: Reviewed and stable  Last Vitals:  Filed Vitals:   10/31/14 0951  BP: 140/77  Pulse: 99  Temp: 37 C  Resp: 19    Complications: No apparent anesthesia complications

## 2014-10-31 NOTE — Progress Notes (Signed)
  Subjective: No complaints this am  Objective: Vital signs in last 24 hours: Temp:  [98.2 F (36.8 C)-98.7 F (37.1 C)] 98.6 F (37 C) (04/18 0535) Pulse Rate:  [83-104] 98 (04/18 0535) Resp:  [16-19] 19 (04/18 0535) BP: (112-125)/(59-75) 125/75 mmHg (04/18 0535) SpO2:  [98 %-100 %] 100 % (04/18 0535) Last BM Date: 10/30/14  Intake/Output from previous day: 04/17 0701 - 04/18 0700 In: 5783.8 [P.O.:840; I.V.:4793.8; IV Piggyback:150] Out: 1600 [Urine:1600] Intake/Output this shift:    GI: soft nt/nd low midline   Lab Results:   Recent Labs  10/29/14 0306 10/30/14 0424  WBC 6.7 6.3  HGB 10.8* 11.5*  HCT 32.2* 35.0*  PLT 149* 153   BMET  Recent Labs  10/30/14 0424 10/31/14 0330  NA 143 142  K 3.6 3.4*  CL 117* 121*  CO2 17* 14*  GLUCOSE 104* 100*  BUN 24* 12  CREATININE 1.12* 1.04  CALCIUM 8.8 8.5   PT/INR No results for input(s): LABPROT, INR in the last 72 hours. ABG No results for input(s): PHART, HCO3 in the last 72 hours.  Invalid input(s): PCO2, PO2  Studies/Results: No results found.  Anti-infectives: Anti-infectives    Start     Dose/Rate Route Frequency Ordered Stop   10/28/14 1100  cefTRIAXone (ROCEPHIN) 1 g in dextrose 5 % 50 mL IVPB     1 g 100 mL/hr over 30 Minutes Intravenous Every 24 hours 10/28/14 1053        Assessment/Plan: Symptomatic cholelithiasis Plan lap chole today, discussed possibility of additional ports for adhesiolysis given her prior low midline incision  ARI resolved  [facility], LLCWAKEFIELD,Ariam Mol 10/31/2014

## 2014-10-31 NOTE — Discharge Instructions (Signed)

## 2014-10-31 NOTE — Anesthesia Procedure Notes (Signed)
Procedure Name: Intubation Date/Time: 10/31/2014 11:07 AM Performed by: Edmonia CaprioAUSTON, Bonnie Ellison Pre-anesthesia Checklist: Patient identified, Timeout performed, Emergency Drugs available, Suction available and Patient being monitored Patient Re-evaluated:Patient Re-evaluated prior to inductionOxygen Delivery Method: Circle system utilized Preoxygenation: Pre-oxygenation with 100% oxygen Intubation Type: IV induction, Rapid sequence and Cricoid Pressure applied Laryngoscope Size: Miller and 2 Grade View: Grade I Tube type: Oral Number of attempts: 1 Airway Equipment and Method: Stylet Placement Confirmation: ETT inserted through vocal cords under direct vision,  breath sounds checked- equal and bilateral and positive ETCO2 Secured at: 22 cm Tube secured with: Tape Dental Injury: Teeth and Oropharynx as per pre-operative assessment

## 2014-11-01 LAB — BASIC METABOLIC PANEL
Anion gap: 9 (ref 5–15)
BUN: 6 mg/dL (ref 6–23)
CO2: 15 mmol/L — ABNORMAL LOW (ref 19–32)
Calcium: 8.5 mg/dL (ref 8.4–10.5)
Chloride: 118 mmol/L — ABNORMAL HIGH (ref 96–112)
Creatinine, Ser: 0.88 mg/dL (ref 0.50–1.10)
GFR calc Af Amer: 77 mL/min — ABNORMAL LOW (ref >90)
GFR calc non Af Amer: 66 mL/min — ABNORMAL LOW (ref >90)
Glucose, Bld: 85 mg/dL (ref 70–99)
Potassium: 3.6 mmol/L (ref 3.5–5.1)
Sodium: 142 mmol/L (ref 135–145)

## 2014-11-01 MED ORDER — TRAMADOL HCL 50 MG PO TABS: 50.0000 mg | ORAL_TABLET | Freq: Four times a day (QID) | ORAL | Status: DC | PRN

## 2014-11-01 NOTE — Anesthesia Postprocedure Evaluation (Signed)
Anesthesia Post Note  Patient: Bonnie Ellison  Procedure(s) Performed: Procedure(s) (LRB): LAPAROSCOPIC CHOLECYSTECTOMY (N/A)  Anesthesia type: general  Patient location: PACU  Post pain: Pain level controlled  Post assessment: Patient's Cardiovascular Status Stable  Post vital signs: Reviewed and stable  Level of consciousness: sedated  Complications: No apparent anesthesia complications

## 2014-11-01 NOTE — Progress Notes (Signed)
Discharge instructions and Rx reviewed and given to patient. Pt asked appropriate questions and reports feeling ready for discharge.

## 2014-11-01 NOTE — Care Management Note (Signed)
  Page 1 of 1   11/01/2014     10:11:32 AM CARE MANAGEMENT NOTE 11/01/2014  Patient:  Bonnie Ellison,Bonnie Ellison   Account Number:  [id number]  Date Initiated:  10/28/2014  Documentation initiated by:  COLE,ANGELA  Subjective/Objective Assessment:   PTA from home admitted with symptomatic cholelithiasis.     Action/Plan:   Return home when medically stable.CM to f/u with d/c needs.   Anticipated DC Date:  11/01/2014   Anticipated DC Plan:  HOME/SELF CARE      DC Planning Services  CM consult      Choice offered to / List presented to:             Status of service:  In process, will continue to follow Medicare Important Message given?  NA - LOS <3 / Initial given by admissions (If response is "NO", the following Medicare IM given date fields will be blank) Date Medicare IM given:   Medicare IM given by:   Date Additional Medicare IM given:  11/01/2014 Additional Medicare IM given by:  Bonnie Ellison Aubrianne Molyneux  Discharge Disposition:  HOME/SELF CARE  Per UR Regulation:  Reviewed for med. necessity/level of care/duration of stay  If discussed at Long Length of Stay Meetings, dates discussed:    Comments:

## 2014-11-01 NOTE — Discharge Summary (Signed)
Physician Discharge Summary  Bonnie Ellison MVH:[id number] DOB: 17-Mar-1946 DOA: 10/28/2014  PCP: Redmond Baseman, MD  Consultation: none  Admit date: 10/28/2014 Discharge date: 11/01/2014  Recommendations for Outpatient Follow-up:   Follow-up Information    Follow up with [facility] On 11/22/2014.   Specialty:  General Surgery   Why:  arrive by 1PM for a 1:30PM post operative check up   Contact information:   [address] [place] [address] [phone]      Discharge Diagnoses:  1. Acute renal failure 2. Acute cholecystitis  3. S/p laparoscopic cholecystectomy 4. Hypokalemia    Surgical Procedure: laparoscopic cholecystectomy---Dr. Dwain Sarna   Discharge Condition: stable Disposition: home  Diet recommendation: heart healthy   Filed Weights   10/28/14 1200  Weight: 85.6 kg (188 lb 11.4 oz)       Hospital Course:  Bonnie Ellison is a 69 year old female scheduled for a laparoscopic choelcsytectomy with Dr. Derrell Lolling, but was found to have a K of 2.9 pre-op and sent to the ED. Her work up shows sCr 4.63, K 3, normal white count, hypotension and a low potassium.  She was admitted for IVF resuscitation.  Renal failure resolved.  Potassium was supplemented.  She then underwent a cholecystectomy and was transferred to the floor.  She remained stable.  Follow up labs showed a normal renal function.  On POD#1 the patient was tolerating a diet, pain controlled, ambulating and voiding.  She was therefore felt stable for discharge home.  Medication risks, benefits and therapeutic alternatives were reviewed with the patient.  She verbalizes understanding.  A follow up appointment was provided.  She was encouraged to call with questions or concerns.   Physical Exam: General appearance: alert and oriented. Calm and cooperative No acute distress. VSS. Afebrile.  Resp: clear to auscultation bilaterally  Cardio: S1S1 RRR without murmurs or gallops. No  edema. GI: soft round and mildly tender over incisions, no erythema. +BS x4 quadrants.  No organomegaly, hernias or masses.  Pulses: +2 bilateral distal pulses without cyanosis     Discharge Instructions     Medication List    TAKE these medications        acetaminophen 325 MG tablet  Commonly known as:  TYLENOL  Take 650 mg by mouth every 6 (six) hours as needed.     Azelastine-Fluticasone 137-50 MCG/ACT Susp  Commonly known as:  DYMISTA  Place 1 Inhaler into the nose once.     estradiol 0.5 MG tablet  Commonly known as:  ESTRACE  TAKE 1 TABLET (0.5 MG TOTAL) BY MOUTH DAILY.     fexofenadine 180 MG tablet  Commonly known as:  ALLEGRA  Take 180 mg by mouth daily.     fluticasone 50 MCG/ACT nasal spray  Commonly known as:  FLONASE  Place 2 sprays into both nostrils 2 (two) times daily.     nystatin-triamcinolone ointment  Commonly known as:  MYCOLOG  Apply 1 application topically 2 (two) times daily.     olmesartan-hydrochlorothiazide 20-12.5 MG per tablet  Commonly known as:  BENICAR HCT  Take 1 tablet by mouth daily.     olmesartan-hydrochlorothiazide 40-12.5 MG per tablet  Commonly known as:  BENICAR HCT  Take 1 tablet by mouth daily.     omeprazole 40 MG capsule  Commonly known as:  PRILOSEC  Take 40 mg by mouth daily as needed.     ondansetron 4 MG disintegrating tablet  Commonly known as:  ZOFRAN-ODT  Take 4 mg by mouth every 8 (eight) hours as needed for nausea or vomiting.     PROAIR HFA 108 (90 BASE) MCG/ACT inhaler  Generic drug:  albuterol  USE 2 PUFFS BY MOUTH EVERY 6 HOURS AS NEEDED FOR WHEEZING AND SHORTNESS OF BREATH     traMADol 50 MG tablet  Commonly known as:  ULTRAM  Take 1 tablet (50 mg total) by mouth every 6 (six) hours as needed for moderate pain or severe pain.           Follow-up Information    Follow up with [facility] On 11/22/2014.   Specialty:  General Surgery   Why:  arrive by 1PM for a 1:30PM post  operative check up   Contact information:   [address] [place] [address] [phone]        The results of significant diagnostics from this hospitalization (including imaging, microbiology, ancillary and laboratory) are listed below for reference.    Significant Diagnostic Studies: Ct Abdomen Pelvis W Contrast  10/25/2014   CLINICAL DATA:  Right upper quadrant abdominal pain with nausea and vomiting and weight loss since March 2016. Elevated liver function tests.  EXAM: CT ABDOMEN AND PELVIS WITH CONTRAST  TECHNIQUE: Multidetector CT imaging of the abdomen and pelvis was performed using the standard protocol following bolus administration of intravenous contrast.  CONTRAST:  100mL OMNIPAQUE IOHEXOL 300 MG/ML  SOLN  COMPARISON:  08/22/2014 ultrasound  FINDINGS: Lower chest:  Small type 1 hiatal hernia.  Hepatobiliary: Diffuse hepatic steatosis. 5 mm hypodense lesion in segment 8 of the liver, image 17 series 2. Distended gallbladder with multiple gallstones having nitrogen gas phenomenon. No biliary dilatation. No overt gallbladder wall thickening or pericholecystic fluid.  Pancreas: Unremarkable  Spleen: Unremarkable  Adrenals/Urinary Tract: Unremarkable  Stomach/Bowel: Scattered sigmoid colon and descending colon diverticula without active diverticulitis identified.  Vascular/Lymphatic: Unremarkable  Reproductive: Ovaries normal.  Uterus absent.  Other: No supplemental non-categorized findings.  Musculoskeletal: Severe right and moderate left degenerative hip osteoarthritis. Lower lumbar facet arthropathy and degenerative disc disease observed without overt impingement.  IMPRESSION: 1. Distended gallbladder with multiple gallstones. No definite pericholecystic fluid or gallbladder wall thickening. No biliary dilatation. Correlate clinically in assessing for cholecystitis. 2. Diffuse hepatic steatosis. 5 mm hypodense lesion in the dome of the right hepatic lobe is probably a cyst  although technically nonspecific due to small size. 3. Descending and sigmoid colon diverticulosis without active diverticulitis. 4. Severe degenerative right hip osteoarthritis. 5. Lower lumbar spondylosis and degenerative disc disease without overt impingement.   Electronically Signed   By: Gaylyn RongWalter  Liebkemann M.D.   On: 10/25/2014 13:34    Microbiology: Recent Results (from the past 240 hour(s))  MRSA PCR Screening     Status: None   Collection Time: 10/28/14 12:16 PM  Result Value Ref Range Status   MRSA by PCR NEGATIVE NEGATIVE Final    Comment:        The GeneXpert MRSA Assay (FDA approved for NASAL specimens only), is one component of a comprehensive MRSA colonization surveillance program. It is not intended to diagnose MRSA infection nor to guide or monitor treatment for MRSA infections.   Surgical pcr screen     Status: None   Collection Time: 10/30/14  9:45 AM  Result Value Ref Range Status   MRSA, PCR NEGATIVE NEGATIVE Final   Staphylococcus aureus NEGATIVE NEGATIVE Final    Comment:        The Xpert SA Assay (  FDA approved for NASAL specimens in patients over 89 years of age), is one component of a comprehensive surveillance program.  Test performance has been validated by [facility] for patients greater than or equal to 35 year old. It is not intended to diagnose infection nor to guide or monitor treatment.      Labs: Basic Metabolic Panel:  Recent Labs Lab 10/28/14 0900 10/29/14 0306 10/30/14 0424 10/31/14 0330 11/01/14 0547  NA 134* 137 143 142 142  K 3.0* 3.9 3.6 3.4* 3.6  CL 93* 105 117* 121* 118*  CO2 19 21 17* 14* 15*  GLUCOSE 98 80 104* 100* 85  BUN 80* 53* 24* 12 6  CREATININE 4.63* 2.09* 1.12* 1.04 0.88  CALCIUM 8.8 8.2* 8.8 8.5 8.5  MG 1.8  --   --   --   --    Liver Function Tests:  Recent Labs Lab 10/30/14 0424  AST 35  ALT 30  ALKPHOS 42  BILITOT 0.5  PROT 5.4*  ALBUMIN 2.7*   No results for input(s): LIPASE, AMYLASE in  the last 168 hours. No results for input(s): AMMONIA in the last 168 hours. CBC:  Recent Labs Lab 10/28/14 0900 10/29/14 0306 10/30/14 0424  WBC 8.9 6.7 6.3  NEUTROABS 6.2  --   --   HGB 14.0 10.8* 11.5*  HCT 41.0 32.2* 35.0*  MCV 79.6 81.1 83.1  PLT 180 149* 153   Cardiac Enzymes: No results for input(s): CKTOTAL, CKMB, CKMBINDEX, TROPONINI in the last 168 hours. BNP: BNP (last 3 results) No results for input(s): BNP in the last 8760 hours.  ProBNP (last 3 results) No results for input(s): PROBNP in the last 8760 hours.  CBG: No results for input(s): GLUCAP in the last 168 hours.  Active Problems:   Symptomatic cholelithiasis   Malnutrition of moderate degree   Time coordinating discharge: <30 mins  Signed:  Dequane Strahan, ANP-BC

## 2014-11-02 ENCOUNTER — Encounter (HOSPITAL_COMMUNITY): Payer: Self-pay | Admitting: General Surgery

## 2014-11-03 ENCOUNTER — Other Ambulatory Visit: Payer: Self-pay | Admitting: Internal Medicine

## 2014-11-07 ENCOUNTER — Ambulatory Visit: Payer: Medicare Other | Admitting: Internal Medicine

## 2014-11-10 ENCOUNTER — Ambulatory Visit: Payer: Medicare Other

## 2014-11-11 ENCOUNTER — Telehealth: Payer: Self-pay | Admitting: Internal Medicine

## 2014-11-11 NOTE — Telephone Encounter (Signed)
Should be able to restart as before. Let us know if there are problems, like significant local reactions.

## 2014-11-11 NOTE — Telephone Encounter (Signed)
[phone], pt cb

## 2014-11-11 NOTE — Telephone Encounter (Signed)
Called and spoke to pt. Pt stated she has not had her allergy vaccine in 3 weeks and needs to know how to restart and at what dose. Allergy sheet attached.  CY please advise.

## 2014-11-11 NOTE — Telephone Encounter (Signed)
lmtcb for pt.  

## 2014-11-13 ENCOUNTER — Observation Stay (HOSPITAL_COMMUNITY)
Admission: EM | Admit: 2014-11-13 | Discharge: 2014-11-16 | Disposition: A | Discharge status: Home / Self Care | Payer: Medicare Other | Attending: Internal Medicine | Admitting: Internal Medicine

## 2014-11-13 ENCOUNTER — Encounter (HOSPITAL_COMMUNITY): Payer: Self-pay

## 2014-11-13 ENCOUNTER — Other Ambulatory Visit (HOSPITAL_COMMUNITY): Payer: Self-pay

## 2014-11-13 DIAGNOSIS — J449 Chronic obstructive pulmonary disease, unspecified: Secondary | ICD-10-CM | POA: Insufficient documentation

## 2014-11-13 DIAGNOSIS — I10 Essential (primary) hypertension: Secondary | ICD-10-CM | POA: Diagnosis not present

## 2014-11-13 DIAGNOSIS — Z872 Personal history of diseases of the skin and subcutaneous tissue: Secondary | ICD-10-CM | POA: Diagnosis not present

## 2014-11-13 DIAGNOSIS — J4489 Other specified chronic obstructive pulmonary disease: Secondary | ICD-10-CM | POA: Diagnosis present

## 2014-11-13 DIAGNOSIS — Z79899 Other long term (current) drug therapy: Secondary | ICD-10-CM | POA: Insufficient documentation

## 2014-11-13 DIAGNOSIS — R112 Nausea with vomiting, unspecified: Secondary | ICD-10-CM

## 2014-11-13 DIAGNOSIS — E43 Unspecified severe protein-calorie malnutrition: Secondary | ICD-10-CM | POA: Insufficient documentation

## 2014-11-13 DIAGNOSIS — R000 Tachycardia, unspecified: Secondary | ICD-10-CM | POA: Insufficient documentation

## 2014-11-13 DIAGNOSIS — E876 Hypokalemia: Secondary | ICD-10-CM | POA: Diagnosis present

## 2014-11-13 DIAGNOSIS — R197 Diarrhea, unspecified: Secondary | ICD-10-CM | POA: Insufficient documentation

## 2014-11-13 DIAGNOSIS — Z9049 Acquired absence of other specified parts of digestive tract: Secondary | ICD-10-CM | POA: Diagnosis not present

## 2014-11-13 DIAGNOSIS — K219 Gastro-esophageal reflux disease without esophagitis: Secondary | ICD-10-CM | POA: Insufficient documentation

## 2014-11-13 DIAGNOSIS — Z7951 Long term (current) use of inhaled steroids: Secondary | ICD-10-CM | POA: Diagnosis not present

## 2014-11-13 DIAGNOSIS — J45998 Other asthma: Secondary | ICD-10-CM | POA: Diagnosis not present

## 2014-11-13 LAB — URINALYSIS, ROUTINE W REFLEX MICROSCOPIC
Glucose, UA: NEGATIVE mg/dL
Hgb urine dipstick: NEGATIVE
Ketones, ur: >80 mg/dL — AB
Nitrite: POSITIVE — AB
Protein, ur: 100 mg/dL — AB
Specific Gravity, Urine: 1.039 — ABNORMAL HIGH (ref 1.005–1.030)
Urobilinogen, UA: 1 mg/dL (ref 0.0–1.0)
pH: 6 (ref 5.0–8.0)

## 2014-11-13 LAB — COMPREHENSIVE METABOLIC PANEL
ALT: 57 U/L — ABNORMAL HIGH (ref 14–54)
AST: 70 U/L — ABNORMAL HIGH (ref 15–41)
Albumin: 3 g/dL — ABNORMAL LOW (ref 3.5–5.0)
Alkaline Phosphatase: 64 U/L (ref 38–126)
Anion gap: 14 (ref 5–15)
BUN: 8 mg/dL (ref 6–20)
CO2: 31 mmol/L (ref 22–32)
Calcium: 8.6 mg/dL — ABNORMAL LOW (ref 8.9–10.3)
Chloride: 91 mmol/L — ABNORMAL LOW (ref 101–111)
Creatinine, Ser: 0.82 mg/dL (ref 0.44–1.00)
GFR calc Af Amer: >60 mL/min (ref >60)
GFR calc non Af Amer: >60 mL/min (ref >60)
Glucose, Bld: 96 mg/dL (ref 70–99)
Potassium: 2.2 mmol/L — CL (ref 3.5–5.1)
Sodium: 136 mmol/L (ref 135–145)
Total Bilirubin: 1.6 mg/dL — ABNORMAL HIGH (ref 0.3–1.2)
Total Protein: 5.9 g/dL — ABNORMAL LOW (ref 6.5–8.1)

## 2014-11-13 LAB — BASIC METABOLIC PANEL
Anion gap: 17 — ABNORMAL HIGH (ref 5–15)
BUN: 8 mg/dL (ref 6–20)
CO2: 30 mmol/L (ref 22–32)
Calcium: 8.7 mg/dL — ABNORMAL LOW (ref 8.9–10.3)
Chloride: 90 mmol/L — ABNORMAL LOW (ref 101–111)
Creatinine, Ser: 0.83 mg/dL (ref 0.44–1.00)
GFR calc Af Amer: >60 mL/min (ref >60)
GFR calc non Af Amer: >60 mL/min (ref >60)
Glucose, Bld: 81 mg/dL (ref 70–99)
Potassium: 2.3 mmol/L — CL (ref 3.5–5.1)
Sodium: 137 mmol/L (ref 135–145)

## 2014-11-13 LAB — CBC WITH DIFFERENTIAL/PLATELET
Basophils Absolute: 0 10*3/uL (ref 0.0–0.1)
Basophils Relative: 0 % (ref 0–1)
Eosinophils Absolute: 0.2 10*3/uL (ref 0.0–0.7)
Eosinophils Relative: 2 % (ref 0–5)
HCT: 38.5 % (ref 36.0–46.0)
Hemoglobin: 13 g/dL (ref 12.0–15.0)
Lymphocytes Relative: 26 % (ref 12–46)
Lymphs Abs: 2 10*3/uL (ref 0.7–4.0)
MCH: 27.4 pg (ref 26.0–34.0)
MCHC: 33.8 g/dL (ref 30.0–36.0)
MCV: 81.1 fL (ref 78.0–100.0)
Monocytes Absolute: 1.1 10*3/uL — ABNORMAL HIGH (ref 0.1–1.0)
Monocytes Relative: 14 % — ABNORMAL HIGH (ref 3–12)
Neutro Abs: 4.4 10*3/uL (ref 1.7–7.7)
Neutrophils Relative %: 58 % (ref 43–77)
Platelets: 256 10*3/uL (ref 150–400)
RBC: 4.75 MIL/uL (ref 3.87–5.11)
RDW: 15.5 % (ref 11.5–15.5)
WBC: 7.7 10*3/uL (ref 4.0–10.5)

## 2014-11-13 LAB — URINE MICROSCOPIC-ADD ON

## 2014-11-13 MED ORDER — POTASSIUM CHLORIDE CRYS ER 20 MEQ PO TBCR
40.0000 meq | EXTENDED_RELEASE_TABLET | ORAL | Status: AC
  Administered 2014-11-13: 40 meq via ORAL
  Filled 2014-11-13: qty 2

## 2014-11-13 MED ORDER — ALUM & MAG HYDROXIDE-SIMETH 200-200-20 MG/5ML PO SUSP
30.0000 mL | Freq: Once | ORAL | Status: AC
  Administered 2014-11-13: 30 mL via ORAL
  Filled 2014-11-13: qty 30

## 2014-11-13 MED ORDER — TRAMADOL HCL 50 MG PO TABS: 50.0000 mg | ORAL_TABLET | Freq: Four times a day (QID) | ORAL | Status: DC | PRN

## 2014-11-13 MED ORDER — ONDANSETRON HCL 4 MG/2ML IJ SOLN: 4.0000 mg | Freq: Four times a day (QID) | INTRAMUSCULAR | Status: DC | PRN

## 2014-11-13 MED ORDER — ACETAMINOPHEN 650 MG RE SUPP: 650.0000 mg | Freq: Four times a day (QID) | RECTAL | Status: DC | PRN

## 2014-11-13 MED ORDER — FLUTICASONE PROPIONATE 50 MCG/ACT NA SUSP
2.0000 | Freq: Two times a day (BID) | NASAL | Status: DC
  Administered 2014-11-14 – 2014-11-16 (×3): 2 via NASAL
  Filled 2014-11-13: qty 16

## 2014-11-13 MED ORDER — LORATADINE 10 MG PO TABS
10.0000 mg | ORAL_TABLET | Freq: Every day | ORAL | Status: DC
  Administered 2014-11-14 – 2014-11-16 (×3): 10 mg via ORAL
  Filled 2014-11-13 (×3): qty 1

## 2014-11-13 MED ORDER — POTASSIUM CHLORIDE 10 MEQ/100ML IV SOLN
10.0000 meq | INTRAVENOUS | Status: AC
  Administered 2014-11-13 – 2014-11-14 (×5): 10 meq via INTRAVENOUS
  Filled 2014-11-13 (×5): qty 100

## 2014-11-13 MED ORDER — AZELASTINE-FLUTICASONE 137-50 MCG/ACT NA SUSP: 1.0000 | Freq: Once | NASAL | Status: DC

## 2014-11-13 MED ORDER — ENOXAPARIN SODIUM 40 MG/0.4ML ~~LOC~~ SOLN
40.0000 mg | SUBCUTANEOUS | Status: DC
  Administered 2014-11-14 – 2014-11-16 (×3): 40 mg via SUBCUTANEOUS
  Filled 2014-11-13 (×3): qty 0.4

## 2014-11-13 MED ORDER — POTASSIUM CHLORIDE IN NACL 20-0.9 MEQ/L-% IV SOLN
INTRAVENOUS | Status: AC
Start: 2014-11-14 — End: 2014-11-14
  Administered 2014-11-14: 17:00:00 via INTRAVENOUS
  Administered 2014-11-14: 75 mL/h via INTRAVENOUS
  Filled 2014-11-13 (×2): qty 1000

## 2014-11-13 MED ORDER — FENTANYL CITRATE (PF) 100 MCG/2ML IJ SOLN
50.0000 ug | Freq: Once | INTRAMUSCULAR | Status: AC
  Administered 2014-11-13: 50 ug via INTRAVENOUS
  Filled 2014-11-13: qty 2

## 2014-11-13 MED ORDER — ONDANSETRON 4 MG PO TBDP: 4.0000 mg | ORAL_TABLET | Freq: Three times a day (TID) | ORAL | Status: DC | PRN

## 2014-11-13 MED ORDER — ONDANSETRON HCL 4 MG/2ML IJ SOLN
4.0000 mg | Freq: Once | INTRAMUSCULAR | Status: AC
  Administered 2014-11-13: 4 mg via INTRAVENOUS
  Filled 2014-11-13: qty 2

## 2014-11-13 MED ORDER — SODIUM CHLORIDE 0.9 % IV BOLUS (SEPSIS)
1000.0000 mL | Freq: Once | INTRAVENOUS | Status: AC
  Administered 2014-11-13: 1000 mL via INTRAVENOUS

## 2014-11-13 MED ORDER — ACETAMINOPHEN 325 MG PO TABS: 650.0000 mg | ORAL_TABLET | Freq: Four times a day (QID) | ORAL | Status: DC | PRN

## 2014-11-13 MED ORDER — ONDANSETRON HCL 4 MG PO TABS
4.0000 mg | ORAL_TABLET | Freq: Four times a day (QID) | ORAL | Status: DC | PRN
  Administered 2014-11-14: 4 mg via ORAL
  Filled 2014-11-13: qty 1

## 2014-11-13 MED ORDER — POTASSIUM CHLORIDE 20 MEQ/15ML (10%) PO SOLN
40.0000 meq | Freq: Once | ORAL | Status: AC
  Administered 2014-11-13: 40 meq via ORAL

## 2014-11-13 MED ORDER — CEPHALEXIN 250 MG PO CAPS
500.0000 mg | ORAL_CAPSULE | Freq: Once | ORAL | Status: AC
  Administered 2014-11-13: 500 mg via ORAL
  Filled 2014-11-13: qty 2

## 2014-11-13 MED ORDER — PANTOPRAZOLE SODIUM 40 MG PO TBEC
40.0000 mg | DELAYED_RELEASE_TABLET | Freq: Every day | ORAL | Status: DC
  Administered 2014-11-14 – 2014-11-16 (×3): 40 mg via ORAL
  Filled 2014-11-13 (×3): qty 1

## 2014-11-13 MED ORDER — ALBUTEROL SULFATE (2.5 MG/3ML) 0.083% IN NEBU
3.0000 mL | INHALATION_SOLUTION | RESPIRATORY_TRACT | Status: DC | PRN
Start: 2014-11-13 — End: 2014-11-16

## 2014-11-13 MED ORDER — POTASSIUM CHLORIDE 20 MEQ/15ML (10%) PO SOLN
40.0000 meq | ORAL | Status: DC
Start: 2014-11-13 — End: 2014-11-13
  Filled 2014-11-13: qty 30

## 2014-11-13 NOTE — ED Notes (Signed)
Pt reports lap chole done 3 weeks, since then pt has had decreased appetite, vomiting every other day x 3 each time and diarrhea x 2 each day, loose, brown.  lNo c/o abd pain.

## 2014-11-13 NOTE — H&P (Signed)
[facility] History and Physical  Bonnie DkGwendolyn A Tramble ZOX:[id number] DOB: 1946-06-14 DOA: 11/13/2014  Referring physician: Dr. Delford FieldWright. PCP: Redmond BasemanWONG,FRANCIS PATRICK, MD  Specialists:none.  Chief Complaint: nausea vomiting diarrhea and weakness.  HPI: Bonnie Ellison is a 69 y.o. female with history of hypertension and asthma who presents to the ER because of weakness with persistent nausea vomiting and diarrhea. Patient has had recent cholecystectomy. Patient states she has been having nausea vomiting and diarrhea prior to the surgery and has been persistent. Patient was feeling weak and presents to the ER. Patient was found to have severe hypokalemia and has been admitted for replacement. On exam patient denies any abdominal pain and abdomen appears benign. Patient surgical sites has no active discharge. Patient during her last stay for surgery patient was in acute renal failure which improved with fluids. Patient is on Benicar HCT for her hypertension. Patient denies using any recent antibiotics. Patient has been admitted for further management of hypokalemia. On-call surgeon was consulted by ER physician and since patient has no abdominal pain and abdomen appears benign at this time they have requested no further tests.  Review of Systems: As presented in the history of presenting illness, rest negative.  Past Medical History  Diagnosis Date  . Other hyperalimentation   . Urticaria   . Other chronic sinusitis   . Allergic rhinitis, cause unspecified   . Unspecified asthma(493.90)   . Chronic airway obstruction, not elsewhere classified   . GERD (gastroesophageal reflux disease)   . Hypertension   . Acid reflux   . Vertigo    Past Surgical History  Procedure Laterality Date  . Total abdominal hysterectomy w/ bilateral salpingoophorectomy    . Cesarean section    . Eye surgery      DETACHED RETINA RIGHT EYE...   . Cataract extraction      RIGHT EYE  . Abdominal hysterectomy   1991    ABDOMINAL HYSTERECTOMY  leiomyomata  . Cholecystectomy N/A 10/31/2014    Procedure: LAPAROSCOPIC CHOLECYSTECTOMY;  Surgeon: Emelia LoronMatthew Wakefield, MD;  [facility];  Service: General;  Laterality: N/A;   Social History:  reports that she has never smoked. She has never used smokeless tobacco. She reports that she does not drink alcohol or use illicit drugs. Where does patient live home. Can patient participate in ADLs? Yes.  Allergies  Allergen Reactions  . Shellfish Allergy     throat swelling, hives  . Wheat Bran Itching    Family History:  Family History  Problem Relation Age of Onset  . Heart failure Mother   . Heart disease Mother   . Pancreatic cancer Father   . Cancer Father [phone]    PANCREATIC (DECEASED)  . Diabetes Brother     DEACESED.Marland Kitchen. HAD A KIDNEY TRANSPLANT  . Stroke Brother [phone]    DECEASED  . Breast cancer Sister [phone]      Prior to Admission medications   Medication Sig Start Date End Date Taking? Authorizing Provider  acetaminophen (TYLENOL) 325 MG tablet Take 650 mg by mouth every 6 (six) hours as needed for mild pain.    Yes Historical Provider, MD  Azelastine-Fluticasone (DYMISTA) 137-50 MCG/ACT SUSP Place 1 Inhaler into the nose once. 05/09/14  Yes Waymon Budgelinton D Young, MD  Cholecalciferol (VITAMIN D) 2000 UNITS tablet Take 2,000 Units by mouth daily.   Yes Historical Provider, MD  estradiol (ESTRACE) 0.5 MG tablet TAKE 1 TABLET (0.5 MG TOTAL) BY MOUTH DAILY. 07/11/14  Yes Dara Lordsimothy P Fontaine, MD  fexofenadine (ALLEGRA) 180 MG tablet Take 180 mg by mouth daily.     Yes Historical Provider, MD  fluticasone (FLONASE) 50 MCG/ACT nasal spray Place 2 sprays into both nostrils 2 (two) times daily. 03/18/14  Yes Waymon Budge, MD  nystatin-triamcinolone ointment (MYCOLOG) Apply 1 application topically 2 (two) times daily. 09/27/13  Yes Dara Lords, MD  olmesartan-hydrochlorothiazide (BENICAR HCT) 40-12.5 MG per tablet Take 1 tablet by mouth daily.   Yes Historical  Provider, MD  omeprazole (PRILOSEC) 40 MG capsule Take 40 mg by mouth daily as needed (heartburn).    Yes Historical Provider, MD  ondansetron (ZOFRAN-ODT) 4 MG disintegrating tablet Take 4 mg by mouth every 8 (eight) hours as needed for nausea or vomiting.   Yes Historical Provider, MD  PROAIR HFA 108 (90 BASE) MCG/ACT inhaler USE 2 PUFFS BY MOUTH EVERY 6 HOURS AS NEEDED FOR WHEEZING AND SHORTNESS OF BREATH 11/03/14  Yes Waymon Budge, MD  traMADol (ULTRAM) 50 MG tablet Take 1 tablet (50 mg total) by mouth every 6 (six) hours as needed for moderate pain or severe pain. 11/01/14  Yes Ashok Norris, NP    Physical Exam: Filed Vitals:   11/13/14 1900 11/13/14 2000 11/13/14 2100 11/13/14 2152  BP: 111/68 102/65 116/67 135/79  Pulse: 91 89 92 97  Temp:    99.1 F (37.3 C)  TempSrc:    Oral  Resp:    17  Height:     (1.575 m)  Weight:    83.689 kg (184 lb 8 oz)  SpO2: 96% 98% 97% 100%     General:  Well-developed and nourished.  Eyes: anicteric no pallor.  ENT: no discharge from the ears eyes normal.  Neck: no mass felt.  Cardiovascular: S1-S2 heard.  Respiratory: no rhonchi or crepitations.  Abdomen: soft nontender bowel sounds present. Surgical sutures seen.  Skin: no rash.  Musculoskeletal: no edema.  Psychiatric: appears normal.  Neurologic: alert awake oriented to time place and person. Moves all extremities.  Labs on Admission:  Basic Metabolic Panel:  Recent Labs Lab 11/13/14 1556 11/13/14 1837  NA 136 137  K 2.2* 2.3*  CL 91* 90*  CO2 31 30  GLUCOSE 96 81  BUN 8 8  CREATININE 0.82 0.83  CALCIUM 8.6* 8.7*   Liver Function Tests:  Recent Labs Lab 11/13/14 1556  AST 70*  ALT 57*  ALKPHOS 64  BILITOT 1.6*  PROT 5.9*  ALBUMIN 3.0*   No results for input(s): LIPASE, AMYLASE in the last 168 hours. No results for input(s): AMMONIA in the last 168 hours. CBC:  Recent Labs Lab 11/13/14 1556  WBC 7.7  NEUTROABS 4.4  HGB 13.0  HCT 38.5   MCV 81.1  PLT 256   Cardiac Enzymes: No results for input(s): CKTOTAL, CKMB, CKMBINDEX, TROPONINI in the last 168 hours.  BNP (last 3 results) No results for input(s): BNP in the last 8760 hours.  ProBNP (last 3 results) No results for input(s): PROBNP in the last 8760 hours.  CBG: No results for input(s): GLUCAP in the last 168 hours.  Radiological Exams on Admission: No results found.  EKG: Independently reviewed. Normal sinus rhythm with nonspecific ST changes.  Assessment/Plan Principal Problem:   Hypokalemia Active Problems:   Allergic-infective asthma   Nausea vomiting and diarrhea   Essential hypertension   1. Severe hypokalemia - probably secondary to patient's persistent nausea vomiting and diarrhea and patient being on HCTZ. At this time I have discontinued the patient's  HCTZ. Patient did receive 80 mg by mouth potassium in ER and I have ordered 5K runs IV. Check magnesium levels and if low replace magnesium. Continue with gentle hydration. 2. Persistent nausea vomiting and diarrhea - patient of this time is requesting food. Abdomen abdomen is benign. Patient's LFTs mildly elevated. I have ordered acute abdominal series. If patient's LFTs worsening may need further imaging.I have ordered stool studies. 3. Elevated LFTs with recent cholecystectomy - see #2. 4. Hypertension - due to persistent hypokalemia I have discontinued patient's head CT but will continue Benicar. Closely follow blood pressure trends. 5. Possible UTI - patient has been placed on ceftriaxone. Follow urine cultures. 6. Asthma - presently not wheezing. Continue inhalers.   DVT ProphylaxisLovenox.  Code Status: full code.  Family Communication: discussed with patient.  Disposition Plan: admit for observation.    KAKRAKANDY,ARSHAD N. [facility] Pager [phone].  If 7PM-7AM, please contact night-coverage [email or web address] Password [email or web address] 11/13/2014, 11:56 PM

## 2014-11-13 NOTE — ED Provider Notes (Signed)
CSN: [id number]     Arrival date & time 11/13/14  1343 History   First MD Initiated Contact with Patient 11/13/14 1522     Chief Complaint  Patient presents with  . Emesis     (Consider location/radiation/quality/duration/timing/severity/associated sxs/prior Treatment) Patient is a 69 y.o. female presenting with vomiting. The history is provided by the patient. No language interpreter was used.  Emesis Severity:  Moderate Timing:  Intermittent Number of daily episodes:  3+ Quality:  Stomach contents Able to tolerate:  Liquids Progression:  Unchanged Chronicity:  New Recent urination:  Decreased Relieved by:  Nothing Exacerbated by: solid food. Ineffective treatments:  None tried Associated symptoms: diarrhea   Associated symptoms: no abdominal pain, no chills, no cough, no fever, no headaches, no myalgias, no sore throat and no URI   Risk factors: prior abdominal surgery   Risk factors: no alcohol use, no diabetes, no sick contacts, no suspect food intake and no travel to endemic areas     Past Medical History  Diagnosis Date  . Other hyperalimentation   . Urticaria   . Other chronic sinusitis   . Allergic rhinitis, cause unspecified   . Unspecified asthma(493.90)   . Chronic airway obstruction, not elsewhere classified   . GERD (gastroesophageal reflux disease)   . Hypertension   . Acid reflux   . Vertigo    Past Surgical History  Procedure Laterality Date  . Total abdominal hysterectomy w/ bilateral salpingoophorectomy    . Cesarean section    . Eye surgery      DETACHED RETINA RIGHT EYE...   . Cataract extraction      RIGHT EYE  . Abdominal hysterectomy  1991    ABDOMINAL HYSTERECTOMY  leiomyomata  . Cholecystectomy N/A 10/31/2014    Procedure: LAPAROSCOPIC CHOLECYSTECTOMY;  Surgeon: Emelia Loron, MD;  Location: [facility] OR;  Service: General;  Laterality: N/A;   Family History  Problem Relation Age of Onset  . Heart failure Mother   . Heart disease Mother    . Pancreatic cancer Father   . Cancer Father 58    PANCREATIC (DECEASED)  . Diabetes Brother     DEACESED.Marland Kitchen HAD A KIDNEY TRANSPLANT  . Stroke Brother 80    DECEASED  . Breast cancer Sister 45   History  Substance Use Topics  . Smoking status: Never Smoker   . Smokeless tobacco: Never Used  . Alcohol Use: No   OB History    Gravida Para Term Preterm AB TAB SAB Ectopic Multiple Living   Review of Systems  Constitutional: Negative for fever, chills, diaphoresis and fatigue.  HENT: Negative for sore throat.   Respiratory: Negative for cough, chest tightness and shortness of breath.   Cardiovascular: Negative for chest pain and palpitations.  Gastrointestinal: Positive for nausea, vomiting and diarrhea. Negative for abdominal pain, constipation and blood in stool.  Genitourinary: Positive for decreased urine volume. Negative for dysuria, urgency and hematuria.  Musculoskeletal: Negative for myalgias and back pain.  Skin: Positive for wound (s/p abd surgery).  Neurological: Negative for speech difficulty, weakness, light-headedness, numbness and headaches.  Psychiatric/Behavioral: Negative for confusion.  All other systems reviewed and are negative.     Allergies  Shellfish allergy and Wheat bran  Home Medications   Prior to Admission medications   Medication Sig Start Date End Date Taking? Authorizing Provider  acetaminophen (TYLENOL) 325 MG tablet Take 650 mg by mouth every 6 (  six) hours as needed.      Historical Provider, MD  Azelastine-Fluticasone (DYMISTA) 137-50 MCG/ACT SUSP Place 1 Inhaler into the nose once. 05/09/14   Waymon Budge, MD  estradiol (ESTRACE) 0.5 MG tablet TAKE 1 TABLET (0.5 MG TOTAL) BY MOUTH DAILY. 07/11/14   Dara Lords, MD  fexofenadine (ALLEGRA) 180 MG tablet Take 180 mg by mouth daily.      Historical Provider, MD  fluticasone (FLONASE) 50 MCG/ACT nasal spray Place 2 sprays into both nostrils 2 (two) times daily.  03/18/14   Waymon Budge, MD  nystatin-triamcinolone ointment (MYCOLOG) Apply 1 application topically 2 (two) times daily. 09/27/13   Dara Lords, MD  olmesartan-hydrochlorothiazide (BENICAR HCT) 20-12.5 MG per tablet Take 1 tablet by mouth daily.    Historical Provider, MD  olmesartan-hydrochlorothiazide (BENICAR HCT) 40-12.5 MG per tablet Take 1 tablet by mouth daily.    Historical Provider, MD  omeprazole (PRILOSEC) 40 MG capsule Take 40 mg by mouth daily as needed.      Historical Provider, MD  ondansetron (ZOFRAN-ODT) 4 MG disintegrating tablet Take 4 mg by mouth every 8 (eight) hours as needed for nausea or vomiting.    Historical Provider, MD  PROAIR HFA 108 (90 BASE) MCG/ACT inhaler USE 2 PUFFS BY MOUTH EVERY 6 HOURS AS NEEDED FOR WHEEZING AND SHORTNESS OF BREATH 11/03/14   Waymon Budge, MD  traMADol (ULTRAM) 50 MG tablet Take 1 tablet (50 mg total) by mouth every 6 (six) hours as needed for moderate pain or severe pain. 11/01/14   Emina Riebock, NP   BP 103/77 mmHg  Pulse 126  Temp(Src) 97.7 F (36.5 C) (Oral)  Resp 16  Ht 5\' 2"  (1.575 m)  Wt 181 lb (82.101 kg)  BMI 33.10 kg/m2  SpO2 98%  LMP 05/10/1990   Physical Exam  Constitutional: She is oriented to person, place, and time. She appears well-developed and well-nourished. She is cooperative. She does not appear ill. No distress.  HENT:  Head: Normocephalic and atraumatic.  Nose: Nose normal.  Mouth/Throat: Oropharynx is clear and moist. No oropharyngeal exudate.  Oral mucosa is moist and pink  Eyes: Conjunctivae and EOM are normal. Pupils are equal, round, and reactive to light.  No conjunctival pallor  Neck: Normal range of motion. Neck supple.  Cardiovascular: Regular rhythm, normal heart sounds and intact distal pulses.  Tachycardia present.   No murmur heard. Pulmonary/Chest: Effort normal and breath sounds normal. No respiratory distress. She has no wheezes. She exhibits no tenderness.  Abdominal: Soft. There  is no tenderness. There is no rebound and no guarding.  Abd is soft, nontender, with well healing laparoscopic port sites.    Musculoskeletal: Normal range of motion. She exhibits no tenderness.  Lymphadenopathy:    She has no cervical adenopathy.  Neurological: She is alert and oriented to person, place, and time. No cranial nerve deficit. Coordination normal.  Skin: Skin is warm and dry. She is not diaphoretic.  Psychiatric: She has a normal mood and affect. Her behavior is normal. Judgment and thought content normal.  Nursing note and vitals reviewed.   ED Course  Procedures (including critical care time) Labs Review Labs Reviewed  CBC WITH DIFFERENTIAL/PLATELET - Abnormal; Notable for the following:    Monocytes Relative 14 (*)    Monocytes Absolute 1.1 (*)    All other components within normal limits  COMPREHENSIVE METABOLIC PANEL - Abnormal; Notable for the following:    Potassium 2.2 (*)    Chloride  91 (*)    Calcium 8.6 (*)    Total Protein 5.9 (*)    Albumin 3.0 (*)    AST 70 (*)    ALT 57 (*)    Total Bilirubin 1.6 (*)    All other components within normal limits  URINALYSIS, ROUTINE W REFLEX MICROSCOPIC - Abnormal; Notable for the following:    Color, Urine RED (*)    APPearance CLOUDY (*)    Specific Gravity, Urine 1.039 (*)    Bilirubin Urine LARGE (*)    Ketones, ur >80 (*)    Protein, ur 100 (*)    Nitrite POSITIVE (*)    Leukocytes, UA SMALL (*)    All other components within normal limits  URINE MICROSCOPIC-ADD ON - Abnormal; Notable for the following:    Squamous Epithelial / LPF FEW (*)    All other components within normal limits  BASIC METABOLIC PANEL - Abnormal; Notable for the following:    Potassium 2.3 (*)    Chloride 90 (*)    Calcium 8.7 (*)    Anion gap 17 (*)    All other components within normal limits  URINE CULTURE  CLOSTRIDIUM DIFFICILE BY PCR  STOOL CULTURE  MAGNESIUM  BASIC METABOLIC PANEL  CBC WITH DIFFERENTIAL/PLATELET  CBC   CBC  CREATININE, SERUM    Imaging Review No results found.   EKG Interpretation None      MDM   Final diagnoses:  Non-intractable vomiting with nausea, vomiting of unspecified type  History of cholecystectomy   Pt is a 69 yo F with hx of HTN, GERD, asthma, and chronic sinusitis who presents with abd pain and nausea 3 weeks out from a lap cholecystectomy.    Underwent surgery on 4/18 with Dr. Dwain Sarna due to symptomatic cholelithiasis.  She did well post op with pain but has had continued difficulty with tolerating PO intake.  She reports that she has no problems with liquid/soft foot (yogurt, jello) intake but has had persistent nausea with most solid food intake.  She has several episodes of NBNB emesis daily, usually associated with bread or meat ingestion.  She can last several hours after PO intake on occasion, but other times vomits almost immediately.  She has also had somewhat loose stools, which is atypical for her.  Somewhat decreased urination but denies dysuria.  Has been afebrile throughout.  No longer taking narcotics for pain meds.  Was not discharged on Abx.  Has a hx of self-diagnosed lactose intolerance and gluten allergies.    Well appearing, in NAD.  Tachycardic in the 110s but normotensive.  Does not appear clinically dehydrated.  Abd is soft, nontender, with well healing laparoscopic port sites.    Will give NS bolus and zofran while working up to rule out dehydration and abnormal electrolytes due to her persistent emesis.   Labs show significant hypokalemia at 2.2 and hypochloremia at 91; HCO3 31, AG 14.  Not significantly dehydrated (BUN 8, Cr 0.82).  LFTs bumped compared to before surgery (AST 70, ALT 57, T bili 1.6).     Ordered kcl 40 mg then an additional kdur 40 mg per patient request for tablets.   Called general surgery team to discuss.  Spoke with Dr. Janee Morn at [phone].  Reports it is unlikely to be a bile leak, etc based on it being 2 weeks out.  Even  though her LFTs are slightly bumped, as she is nontender and nontoxic appearing, we discussed that emergent imaging is likely not  going to be very beneficial in this patient.   Will treat with po K+ and have her follow up with Dr. Dwain SarnaWakefield in the AM to re-evaluate and possibly get an outpatient RUQ US.     Patient continued to have nausea and developed moderate diffuse abd pain after retching, so was given maalox and fentanyl.    Repeat BMP with K still only 2.3  EKG without signs of hypokalemia.   Will admit to follow labs and continue to treat for nausea.  Patient and husband informed of plan to admit and voiced understanding/agreement.  [facility]  Will admit to hospitalist team. Stable for the floor  Patient was seen with ED Attending, Dr. Durene Romansampos  Janeen Watson, MD   Lenell AntuJamie Eulah Walkup, MD 11/14/14 [id number]  Azalia BilisKevin Campos, MD 11/14/14 [phone]

## 2014-11-14 ENCOUNTER — Observation Stay (HOSPITAL_COMMUNITY): Payer: Medicare Other

## 2014-11-14 DIAGNOSIS — I10 Essential (primary) hypertension: Secondary | ICD-10-CM | POA: Diagnosis not present

## 2014-11-14 DIAGNOSIS — R112 Nausea with vomiting, unspecified: Secondary | ICD-10-CM | POA: Diagnosis not present

## 2014-11-14 DIAGNOSIS — R197 Diarrhea, unspecified: Secondary | ICD-10-CM

## 2014-11-14 DIAGNOSIS — E876 Hypokalemia: Secondary | ICD-10-CM

## 2014-11-14 DIAGNOSIS — E43 Unspecified severe protein-calorie malnutrition: Secondary | ICD-10-CM | POA: Diagnosis not present

## 2014-11-14 LAB — HEPATIC FUNCTION PANEL
ALT: 41 U/L (ref 14–54)
AST: 50 U/L — ABNORMAL HIGH (ref 15–41)
Albumin: 2.2 g/dL — ABNORMAL LOW (ref 3.5–5.0)
Alkaline Phosphatase: 45 U/L (ref 38–126)
Bilirubin, Direct: 0.2 mg/dL (ref 0.1–0.5)
Indirect Bilirubin: 0.8 mg/dL (ref 0.3–0.9)
Total Bilirubin: 1 mg/dL (ref 0.3–1.2)
Total Protein: 4.5 g/dL — ABNORMAL LOW (ref 6.5–8.1)

## 2014-11-14 LAB — URINE CULTURE
Colony Count: NO GROWTH
Culture: NO GROWTH

## 2014-11-14 LAB — BASIC METABOLIC PANEL
Anion gap: 8 (ref 5–15)
Anion gap: 8 (ref 5–15)
BUN: 6 mg/dL (ref 6–20)
BUN: 5 mg/dL — ABNORMAL LOW (ref 6–20)
CO2: 31 mmol/L (ref 22–32)
CO2: 26 mmol/L (ref 22–32)
Calcium: 7.7 mg/dL — ABNORMAL LOW (ref 8.9–10.3)
Calcium: 7.5 mg/dL — ABNORMAL LOW (ref 8.9–10.3)
Chloride: 98 mmol/L — ABNORMAL LOW (ref 101–111)
Chloride: 103 mmol/L (ref 101–111)
Creatinine, Ser: 0.77 mg/dL (ref 0.44–1.00)
Creatinine, Ser: 0.63 mg/dL (ref 0.44–1.00)
GFR calc Af Amer: >60 mL/min (ref >60)
GFR calc Af Amer: >60 mL/min (ref >60)
GFR calc non Af Amer: >60 mL/min (ref >60)
GFR calc non Af Amer: >60 mL/min (ref >60)
Glucose, Bld: 92 mg/dL (ref 70–99)
Glucose, Bld: 91 mg/dL (ref 70–99)
Potassium: 3.1 mmol/L — ABNORMAL LOW (ref 3.5–5.1)
Potassium: 3.5 mmol/L (ref 3.5–5.1)
Sodium: 137 mmol/L (ref 135–145)
Sodium: 137 mmol/L (ref 135–145)

## 2014-11-14 LAB — CBC WITH DIFFERENTIAL/PLATELET
Basophils Absolute: 0 10*3/uL (ref 0.0–0.1)
Basophils Relative: 0 % (ref 0–1)
Eosinophils Absolute: 0.2 10*3/uL (ref 0.0–0.7)
Eosinophils Relative: 3 % (ref 0–5)
HCT: 31.1 % — ABNORMAL LOW (ref 36.0–46.0)
Hemoglobin: 10.6 g/dL — ABNORMAL LOW (ref 12.0–15.0)
Lymphocytes Relative: 35 % (ref 12–46)
Lymphs Abs: 2 10*3/uL (ref 0.7–4.0)
MCH: 27.8 pg (ref 26.0–34.0)
MCHC: 34.1 g/dL (ref 30.0–36.0)
MCV: 81.6 fL (ref 78.0–100.0)
Monocytes Absolute: 0.8 10*3/uL (ref 0.1–1.0)
Monocytes Relative: 13 % — ABNORMAL HIGH (ref 3–12)
Neutro Abs: 2.7 10*3/uL (ref 1.7–7.7)
Neutrophils Relative %: 49 % (ref 43–77)
Platelets: 197 10*3/uL (ref 150–400)
RBC: 3.81 MIL/uL — ABNORMAL LOW (ref 3.87–5.11)
RDW: 15.7 % — ABNORMAL HIGH (ref 11.5–15.5)
WBC: 5.6 10*3/uL (ref 4.0–10.5)

## 2014-11-14 LAB — CLOSTRIDIUM DIFFICILE BY PCR: Toxigenic C. Difficile by PCR: NEGATIVE

## 2014-11-14 LAB — MAGNESIUM: Magnesium: 1.1 mg/dL — ABNORMAL LOW (ref 1.7–2.4)

## 2014-11-14 MED ORDER — POTASSIUM CHLORIDE 20 MEQ/15ML (10%) PO SOLN
40.0000 meq | Freq: Once | ORAL | Status: AC
  Administered 2014-11-14: 40 meq via ORAL
  Filled 2014-11-14: qty 30

## 2014-11-14 MED ORDER — CHOLESTYRAMINE 4 G PO PACK
4.0000 g | PACK | Freq: Three times a day (TID) | ORAL | Status: DC
  Administered 2014-11-14 – 2014-11-16 (×7): 4 g via ORAL
  Filled 2014-11-14 (×8): qty 1

## 2014-11-14 MED ORDER — AZELASTINE HCL 0.1 % NA SOLN
1.0000 | Freq: Two times a day (BID) | NASAL | Status: DC
  Filled 2014-11-14: qty 30

## 2014-11-14 MED ORDER — IRBESARTAN 300 MG PO TABS
300.0000 mg | ORAL_TABLET | Freq: Every day | ORAL | Status: DC
  Administered 2014-11-14 – 2014-11-16 (×3): 300 mg via ORAL
  Filled 2014-11-14 (×3): qty 1

## 2014-11-14 MED ORDER — ENSURE ENLIVE PO LIQD
237.0000 mL | Freq: Two times a day (BID) | ORAL | Status: DC
  Administered 2014-11-14 – 2014-11-16 (×4): 237 mL via ORAL

## 2014-11-14 MED ORDER — MAGNESIUM SULFATE 2 GM/50ML IV SOLN
2.0000 g | Freq: Once | INTRAVENOUS | Status: AC
  Administered 2014-11-14: 2 g via INTRAVENOUS
  Filled 2014-11-14: qty 50

## 2014-11-14 MED ORDER — CEFTRIAXONE SODIUM IN DEXTROSE 20 MG/ML IV SOLN
1.0000 g | INTRAVENOUS | Status: DC
  Administered 2014-11-14: 1 g via INTRAVENOUS
  Filled 2014-11-14 (×2): qty 50

## 2014-11-14 NOTE — Progress Notes (Addendum)
ANTIBIOTIC CONSULT NOTE - INITIAL  Pharmacy Consult for Ceftriaxone  Indication: UTI  Allergies  Allergen Reactions  . Shellfish Allergy     throat swelling, hives  . Wheat Bran Itching    Patient Measurements: Height: 5\' 2"  (157.5 cm) Weight: 184 lb 8 oz (83.689 kg) IBW/kg (Calculated) : 50.1  Vital Signs: Temp: 98.7 F (37.1 C) (05/02 0529) Temp Source: Oral (05/02 0529) BP: 108/62 mmHg (05/02 0529) Pulse Rate: 85 (05/02 0529) Intake/Output from previous day: 05/01 0701 - 05/02 0700 In: 641.3 [P.O.:360; I.V.:81.3; IV Piggyback:200] Out: 200 [Urine:200] Intake/Output from this shift: Total I/O In: 641.3 [P.O.:360; I.V.:81.3; IV Piggyback:200] Out: 200 [Urine:200]  Labs:  Recent Labs  11/13/14 1556 11/13/14 1837 11/14/14 0053  WBC 7.7  --  5.6  HGB 13.0  --  10.6*  PLT 256  --  197  CREATININE 0.82 0.83 0.77   Estimated Creatinine Clearance: 68.4 mL/min (by C-G formula based on Cr of 0.77). No results for input(s): VANCOTROUGH, VANCOPEAK, VANCORANDOM, GENTTROUGH, GENTPEAK, GENTRANDOM, TOBRATROUGH, TOBRAPEAK, TOBRARND, AMIKACINPEAK, AMIKACINTROU, AMIKACIN in the last 72 hours.   Microbiology: Recent Results (from the past 720 hour(s))  MRSA PCR Screening     Status: None   Collection Time: 10/28/14 12:16 PM  Result Value Ref Range Status   MRSA by PCR NEGATIVE NEGATIVE Final    Comment:        The GeneXpert MRSA Assay (FDA approved for NASAL specimens only), is one component of a comprehensive MRSA colonization surveillance program. It is not intended to diagnose MRSA infection nor to guide or monitor treatment for MRSA infections.   Surgical pcr screen     Status: None   Collection Time: 10/30/14  9:45 AM  Result Value Ref Range Status   MRSA, PCR NEGATIVE NEGATIVE Final   Staphylococcus aureus NEGATIVE NEGATIVE Final    Comment:        The Xpert SA Assay (FDA approved for NASAL specimens in patients over 68 years of age), is one component  of a comprehensive surveillance program.  Test performance has been validated by [facility] for patients greater than or equal to 68 year old. It is not intended to diagnose infection nor to guide or monitor treatment.     Medical History: Past Medical History  Diagnosis Date  . Other hyperalimentation   . Urticaria   . Other chronic sinusitis   . Allergic rhinitis, cause unspecified   . Unspecified asthma(493.90)   . Chronic airway obstruction, not elsewhere classified   . GERD (gastroesophageal reflux disease)   . Hypertension   . Acid reflux   . Vertigo     Assessment: Ceftriaxone for possible UTI, WBC WNL, U/A inconclusive, urine cultures obtained.   Plan:  -Ceftriaxone 1g IV q24h -Trend WBC, temp -F/U urine culture  Abran DukeLedford, James 11/14/2014,6:24 AM   Addendum  No dose adjustments are required for ceftriaxone. Pharmacy to sign off. Recommend no longer than 7 days of treatment (can shorter duration to 3 days if antibiotic changed to Bactrim or cipro) Please re-consult if needed.  Chaela Branscum D. Celestino Ackerman, PharmD, BCPS Clinical Pharmacist Pager: [phone] 11/14/2014 11:53 AM

## 2014-11-14 NOTE — Progress Notes (Signed)
Initial Nutrition Assessment  DOCUMENTATION CODES:  Severe malnutrition in context of chronic illness  INTERVENTION:  Ensure Enlive (each supplement provides 350kcal and 20 grams of protein)  NUTRITION DIAGNOSIS:  Malnutrition related to chronic illness as evidenced by energy intake < 75% for > or equal to 1 month, percent weight loss.  GOAL:  Patient will meet greater than or equal to 90% of their needs  MONITOR:  PO intake, Supplement acceptance, Labs, Weight trends  REASON FOR ASSESSMENT:  Malnutrition Screening Tool    ASSESSMENT: Patient presented to the ED on 5/1 with weakness, persistent nausea, vomiting, and diarrhea. Recent cholecystectomy. Hypokalemia has been repleted. Work-up ongoing.  Patient reports 2 month history of poor intake related to nausea, vomiting, and diarrhea. She has been able to keep down liquids, but not solids. She has lost ~11% of her usual weight in the past 2 months. She agreed to try Ensure Enlive supplements BID to maximize oral intake to prevent further weight loss. She was able to keep down her oatmeal this AM and she thinks this is great progress.  Height:  Ht Readings from Last 1 Encounters:  11/13/14 5\' 2"  (1.575 m)    Weight:  Wt Readings from Last 1 Encounters:  11/13/14 184 lb 8 oz (83.689 kg)    Ideal Body Weight:  50 kg  Wt Readings from Last 10 Encounters:  11/13/14 184 lb 8 oz (83.689 kg)  10/28/14 188 lb 11.4 oz (85.6 kg)  05/09/14 223 lb 3.2 oz (101.243 kg)  01/07/14 221 lb (100.245 kg)  09/27/13 213 lb (96.616 kg)  10/09/12 221 lb 3.2 oz (100.336 kg)  08/10/12 210 lb (95.255 kg)  04/10/12 222 lb 6.4 oz (100.88 kg)  08/30/11 232 lb 3.2 oz (105.325 kg)  07/11/11 231 lb (104.781 kg)    BMI:  Body mass index is 33.74 kg/(m^2). obesity, class 1  Estimated Nutritional Needs:  Kcal:  1650-1850  Protein:  85-100 gm  Fluid:  2 L  Skin:  Reviewed, no issues  Diet Order:  Diet Heart Room service  appropriate?: Yes; Fluid consistency:: Thin  EDUCATION NEEDS:  Education needs addressed   Intake/Output Summary (Last 24 hours) at 11/14/14 1107 Last data filed at 11/14/14 1027  Gross per 24 hour  Intake 941.25 ml  Output    300 ml  Net 641.25 ml    Last BM:  5/1   Joaquin CourtsKimberly Harris, RD, LDN, CNSC Pager [phone] After Hours Pager [phone]

## 2014-11-14 NOTE — Telephone Encounter (Signed)
lmtcb x1 for pt. 

## 2014-11-14 NOTE — Progress Notes (Signed)
New Admission Note:   Arrival Method: [facility] stretcher [facility]: alert, oriented X4, conversant Telemetry: none ordered Assessment: Completed Skin: warm, dry, surgical wound noted on the mid abdomen, right above the navel, dry and secured with adhesive strips, healed surgical wounds on the right lower abdomen from laparoscopic cholecystectomy 2 weeks ago IV: G20 on the right AC with transparent dressing, clean dry and intact Pain: denies having any pain as of this time Tubes: none Safety Measures: Safety Fall Prevention Plan has been given, discussed and signed Admission: Completed [address] Orientation: Patient has been orientated to the room, unit and staff.  Family: husband at bedside  Orders have been reviewed and implemented. Will continue to monitor the patient. Call light has been placed within reach and bed alarm has been activated.   Janice NorrieAnessa Xyon Lukasik BSN, RN [facility] [address] [place] [phone]

## 2014-11-14 NOTE — Progress Notes (Signed)
Report taken by charge RN Cathie from ED.

## 2014-11-14 NOTE — Progress Notes (Signed)
PATIENT DETAILS Name: Bonnie Ellison Age: 69 y.o. Sex: female Date of Birth: 11-16-1945 Admit Date: 11/13/2014 Admitting Physician Eduard ClosArshad N Kakrakandy, MD ZOX:WRUE,[id number]:WONG,FRANCIS PATRICK, MD  Subjective: Much better-somewhat nauseous, but no vomiting. No BM since admission.   Assessment/Plan: Principal Problem:   Hypokalemia:secondary to GI loss and HCTZ. Repleted, follow lytes.  Active Problems:   Chronic intermittent diarrhea:ongoing since March-spoke with Dr Elnoria HowardHung, who recommended questran, he will evaluate and provide further recommendations   Nausea/Vomting:?viral synd. Supportive care and monitor for now   Mildly elevated Transaminases: improving with supportive care. Recent Cholecystectomy- doubt related to elevated LFT's. Follow.   Asymptomatic Bacteriuria:No symptoms suggestive of UTI-stop Rocephin-monitor off Abx. Follow   XBJ:[id number]:controlled-continue Avapro    Protein-calorie malnutrition, severe:continue supplements.  Disposition: Remain inpatient  Antimicrobial agents  See below  Anti-infectives    Start     Dose/Rate Route Frequency Ordered Stop   11/14/14 0630  cefTRIAXone (ROCEPHIN) 1 g in dextrose 5 % 50 mL IVPB - Premix     1 g 100 mL/hr over 30 Minutes Intravenous Every 24 hours 11/14/14 0627     11/13/14 1845  cephALEXin (KEFLEX) capsule 500 mg     500 mg Oral  Once 11/13/14 1833 11/13/14 1914      DVT Prophylaxis: Prophylactic Lovenox   Code Status: Full code   Family Communication None at bedside  Procedures: None  CONSULTS:  GI-Dr Elnoria HowardHung  MEDICATIONS: Scheduled Meds: . azelastine  1 spray Each Nare BID  . cefTRIAXone (ROCEPHIN)  IV  1 g Intravenous Q24H  . cholestyramine  4 g Oral TID  . enoxaparin (LOVENOX) injection  40 mg Subcutaneous Q24H  . feeding supplement (ENSURE ENLIVE)  237 mL Oral BID BM  . fluticasone  2 spray Each Nare BID  . irbesartan  300 mg Oral Daily  . loratadine  10 mg Oral Daily  . pantoprazole  40  mg Oral Daily   Continuous Infusions: . 0.9 % NaCl with KCl 20 mEq / L 75 mL/hr (11/14/14 0055)   PRN Meds:.acetaminophen **OR** acetaminophen, albuterol, ondansetron **OR** ondansetron (ZOFRAN) IV, traMADol    PHYSICAL EXAM: Vital signs in last 24 hours: Filed Vitals:   11/13/14 2100 11/13/14 2152 11/14/14 0529 11/14/14 0946  BP: 116/67 135/79 108/62 110/59  Pulse: 92 97 85 87  Temp:  99.1 F (37.3 C) 98.7 F (37.1 C) 98.1 F (36.7 C)  TempSrc:  Oral Oral Oral  Resp:  17 16 16   Height:  5\' 2"  (1.575 m)    Weight:  83.689 kg (184 lb 8 oz)    SpO2: 97% 100% 99% 98%    Weight change:  Filed Weights   11/13/14 1420 11/13/14 2152  Weight: 82.101 kg (181 lb) 83.689 kg (184 lb 8 oz)   Body mass index is 33.74 kg/(m^2).   Gen Exam: Awake and alert with clear speech.  Neck: Supple, No JVD.   Chest: B/L Clear.   CVS: S1 S2 Regular, no murmurs.  Abdomen: soft, BS +, non tender, non distended.  Extremities: no edema, lower extremities warm to touch. Neurologic: Non Focal.   Skin: No Rash.   Wounds: N/A.   Intake/Output from previous day:  Intake/Output Summary (Last 24 hours) at 11/14/14 1416 Last data filed at 11/14/14 1100  Gross per 24 hour  Intake 1061.25 ml  Output    450 ml  Net 611.25 ml  LAB RESULTS: CBC  Recent Labs Lab 11/13/14 1556 11/14/14 0053  WBC 7.7 5.6  HGB 13.0 10.6*  HCT 38.5 31.1*  PLT 256 197  MCV 81.1 81.6  MCH 27.4 27.8  MCHC 33.8 34.1  RDW 15.5 15.7*  LYMPHSABS 2.0 2.0  MONOABS 1.1* 0.8  EOSABS 0.2 0.2  BASOSABS 0.0 0.0    Chemistries   Recent Labs Lab 11/13/14 1556 11/13/14 1837 11/14/14 0053 11/14/14 0938  NA 136 137 137 137  K 2.2* 2.3* 3.1* 3.5  CL 91* 90* 98* 103  CO2 GLUCOSE 96 81 92 91  BUN 5*  CREATININE 0.82 0.83 0.77 0.63  CALCIUM 8.6* 8.7* 7.7* 7.5*  MG  --   --  1.1*  --     CBG: No results for input(s): GLUCAP in the last 168 hours.  GFR Estimated Creatinine Clearance:  68.4 mL/min (by C-G formula based on Cr of 0.63).  Coagulation profile No results for input(s): INR, PROTIME in the last 168 hours.  Cardiac Enzymes No results for input(s): CKMB, TROPONINI, MYOGLOBIN in the last 168 hours.  Invalid input(s): CK  Invalid input(s): POCBNP No results for input(s): DDIMER in the last 72 hours. No results for input(s): HGBA1C in the last 72 hours. No results for input(s): CHOL, HDL, LDLCALC, TRIG, CHOLHDL, LDLDIRECT in the last 72 hours. No results for input(s): TSH, T4TOTAL, T3FREE, THYROIDAB in the last 72 hours.  Invalid input(s): FREET3 No results for input(s): VITAMINB12, FOLATE, FERRITIN, TIBC, IRON, RETICCTPCT in the last 72 hours. No results for input(s): LIPASE, AMYLASE in the last 72 hours.  Urine Studies No results for input(s): UHGB, CRYS in the last 72 hours.  Invalid input(s): UACOL, UAPR, USPG, UPH, UTP, UGL, UKET, UBIL, UNIT, UROB, ULEU, UEPI, UWBC, URBC, UBAC, CAST, UCOM, BILUA  MICROBIOLOGY: No results found for this or any previous visit (from the past 240 hour(s)).  RADIOLOGY STUDIES/RESULTS: Ct Abdomen Pelvis W Contrast  10/25/2014   CLINICAL DATA:  Right upper quadrant abdominal pain with nausea and vomiting and weight loss since March 2016. Elevated liver function tests.  EXAM: CT ABDOMEN AND PELVIS WITH CONTRAST  TECHNIQUE: Multidetector CT imaging of the abdomen and pelvis was performed using the standard protocol following bolus administration of intravenous contrast.  CONTRAST:  OMNIPAQUE IOHEXOL 300 MG/ML  SOLN  COMPARISON:  08/22/2014 ultrasound  FINDINGS: Lower chest:  Small type 1 hiatal hernia.  Hepatobiliary: Diffuse hepatic steatosis. 5 mm hypodense lesion in segment 8 of the liver, image 17 series 2. Distended gallbladder with multiple gallstones having nitrogen gas phenomenon. No biliary dilatation. No overt gallbladder wall thickening or pericholecystic fluid.  Pancreas: Unremarkable  Spleen: Unremarkable   Adrenals/Urinary Tract: Unremarkable  Stomach/Bowel: Scattered sigmoid colon and descending colon diverticula without active diverticulitis identified.  Vascular/Lymphatic: Unremarkable  Reproductive: Ovaries normal.  Uterus absent.  Other: No supplemental non-categorized findings.  Musculoskeletal: Severe right and moderate left degenerative hip osteoarthritis. Lower lumbar facet arthropathy and degenerative disc disease observed without overt impingement.  IMPRESSION: 1. Distended gallbladder with multiple gallstones. No definite pericholecystic fluid or gallbladder wall thickening. No biliary dilatation. Correlate clinically in assessing for cholecystitis. 2. Diffuse hepatic steatosis. 5 mm hypodense lesion in the dome of the right hepatic lobe is probably a cyst although technically nonspecific due to small size. 3. Descending and sigmoid colon diverticulosis without active diverticulitis. 4. Severe degenerative right hip osteoarthritis. 5. Lower lumbar spondylosis and degenerative disc disease without overt impingement.  Electronically Signed   By: Gaylyn Rong M.D.   On: 10/25/2014 13:34   Dg Abd Acute W/chest  11/14/2014   CLINICAL DATA:  69 year old female with nausea and vomiting status post cholecystectomy 11/01/14. Initial encounter.  EXAM: DG ABDOMEN ACUTE W/ 1V CHEST  COMPARISON:  Preoperative CT Abdomen and Pelvis 10/25/2014 and earlier.  FINDINGS: Normal cardiac size and mediastinal contours. No pneumothorax or pneumoperitoneum. No confluent pulmonary opacity.  Cholecystectomy clips. Air-fluid levels in the colon which is nondilated. Upper limits of normal mid abdominal small bowel loops, 3 cm diameter. Otherwise negative bowel gas pattern. Abdominal and pelvic visceral contours are within normal limits. No acute osseous abnormality identified. Degenerative changes at the right hip.  IMPRESSION: 1. Non obstructed bowel gas pattern with air-fluid levels in the colon which may indicate diarrhea  or enema use. 2. No free air.  No acute cardiopulmonary abnormality.   Electronically Signed   By: Odessa Fleming M.D.   On: 11/14/2014 07:05    Jeoffrey Massed, MD  [facility] Pager:[phone]  If 7PM-7AM, please contact night-coverage [email or web address] Password [email or web address] 11/14/2014, 2:16 PM   LOS: 1 day

## 2014-11-15 DIAGNOSIS — R197 Diarrhea, unspecified: Secondary | ICD-10-CM | POA: Diagnosis not present

## 2014-11-15 DIAGNOSIS — R112 Nausea with vomiting, unspecified: Secondary | ICD-10-CM | POA: Diagnosis not present

## 2014-11-15 DIAGNOSIS — E876 Hypokalemia: Secondary | ICD-10-CM | POA: Diagnosis not present

## 2014-11-15 LAB — BASIC METABOLIC PANEL
Anion gap: 9 (ref 5–15)
BUN: <5 mg/dL — ABNORMAL LOW (ref 6–20)
CO2: 25 mmol/L (ref 22–32)
Calcium: 7.9 mg/dL — ABNORMAL LOW (ref 8.9–10.3)
Chloride: 101 mmol/L (ref 101–111)
Creatinine, Ser: 0.69 mg/dL (ref 0.44–1.00)
GFR calc Af Amer: >60 mL/min (ref >60)
GFR calc non Af Amer: >60 mL/min (ref >60)
Glucose, Bld: 72 mg/dL (ref 70–99)
Potassium: 4.2 mmol/L (ref 3.5–5.1)
Sodium: 135 mmol/L (ref 135–145)

## 2014-11-15 LAB — MAGNESIUM: Magnesium: 1.7 mg/dL (ref 1.7–2.4)

## 2014-11-15 NOTE — Telephone Encounter (Signed)
Katie and I caught Dr.Young just before he left. He said to take her to 0.1 on both vials. Since she is on 0.1 of 1:50,000 I'm going to back vial B to .05. Noted on allergy record also.

## 2014-11-15 NOTE — Care Management Note (Signed)
Case Management Note  Patient Details  Name: Migdalia DkGwendolyn A Saye MRN: [id number] Date of Birth: 1945-09-10  Subjective/Objective:                    Action/Plan:   Expected Discharge Date:                  Expected Discharge Plan:     In-House Referral:     Discharge planning Services     Post Acute Care Choice:    Choice offered to:     DME Arranged:    DME Agency:     HH Arranged:    HH Agency:     Status of Service:     Medicare Important Message Given:    Date Medicare IM Given:    Medicare IM give by:    Date Additional Medicare IM Given:    Additional Medicare Important Message give by:     If discussed at Long Length of Stay Meetings, dates discussed:    Additional Comments:  Silvio PateMiringu, Shirlyn Savin N, RN 11/15/2014, 6:44 PM

## 2014-11-15 NOTE — Consult Note (Signed)
Reason for Consult: Diarrhea Referring Physician: [facility]  Bonnie Ellison HPI: This is a 69 year old female with a PMH of GERD, COPD, and HTN admitted for diarrhea.  She had issues with diarrhea in March and she was progressively worsening.  I evaluated her in the office and I was concerned with her diarrhea, father's history of pancreatic cancer, and weight loss.  A CT scan was performed, which was negative.  The plan was to perform an EGD/Colonoscopy, however, she was to have a cholecystectomy for symptomatic gallstones.  Since her cholecystectomy her diarrhea worsened, however, when I spoke with her today her history of a bit confusing.  She only reports a couple of diarrheal bowel movements per day and some intermittent vomiting with eating meat, but her admission potassium was in the 2 range.  Currently she feels better and she denies any diarrhea overnight.  Cholestyramine was started.  Past Medical History  Diagnosis Date  . Other hyperalimentation   . Urticaria   . Other chronic sinusitis   . Allergic rhinitis, cause unspecified   . Unspecified asthma(493.90)   . Chronic airway obstruction, not elsewhere classified   . GERD (gastroesophageal reflux disease)   . Hypertension   . Acid reflux   . Vertigo     Past Surgical History  Procedure Laterality Date  . Total abdominal hysterectomy w/ bilateral salpingoophorectomy    . Cesarean section    . Eye surgery      DETACHED RETINA RIGHT EYE...   . Cataract extraction      RIGHT EYE  . Abdominal hysterectomy  1991    ABDOMINAL HYSTERECTOMY  leiomyomata  . Cholecystectomy N/A 10/31/2014    Procedure: LAPAROSCOPIC CHOLECYSTECTOMY;  Surgeon: Emelia Loron, MD;  Location: [facility] OR;  Service: General;  Laterality: N/A;    Family History  Problem Relation Age of Onset  . Heart failure Mother   . Heart disease Mother   . Pancreatic cancer Father   . Cancer Father 53    PANCREATIC (DECEASED)  . Diabetes Brother    DEACESED.Marland Kitchen HAD A KIDNEY TRANSPLANT  . Stroke Brother 70    DECEASED  . Breast cancer Sister 30    Social History:  reports that she has never smoked. She has never used smokeless tobacco. She reports that she does not drink alcohol or use illicit drugs.  Allergies:  Allergies  Allergen Reactions  . Shellfish Allergy     throat swelling, hives  . Wheat Bran Itching    Medications:  Scheduled: . azelastine  1 spray Each Nare BID  . cholestyramine  4 g Oral TID  . enoxaparin (LOVENOX) injection  40 mg Subcutaneous Q24H  . feeding supplement (ENSURE ENLIVE)  237 mL Oral BID BM  . fluticasone  2 spray Each Nare BID  . irbesartan  300 mg Oral Daily  . loratadine  10 mg Oral Daily  . pantoprazole  40 mg Oral Daily   Continuous:   Results for orders placed or performed during the hospital encounter of 11/13/14 (from the past 24 hour(s))  Hepatic function panel     Status: Abnormal   Collection Time: 11/14/14  9:38 AM  Result Value Ref Range   Total Protein 4.5 (L) 6.5 - 8.1 g/dL   Albumin 2.2 (L) 3.5 - 5.0 g/dL   AST 50 (H) 15 - 41 U/L   ALT 41 14 - 54 U/L   Alkaline Phosphatase 45 38 - 126 U/L   Total Bilirubin 1.0  0.3 - 1.2 mg/dL   Bilirubin, Direct 0.2 0.1 - 0.5 mg/dL   Indirect Bilirubin 0.8 0.3 - 0.9 mg/dL  Basic metabolic panel     Status: Abnormal   Collection Time: 11/14/14  9:38 AM  Result Value Ref Range   Sodium 137 135 - 145 mmol/L   Potassium 3.5 3.5 - 5.1 mmol/L   Chloride 103 101 - 111 mmol/L   CO2 26 22 - 32 mmol/L   Glucose, Bld 91 70 - 99 mg/dL   BUN 5 (L) 6 - 20 mg/dL   Creatinine, Ser 1.610.63 0.44 - 1.00 mg/dL   Calcium 7.5 (L) 8.9 - 10.3 mg/dL   GFR calc non Af Amer >60 >60 mL/min   GFR calc Af Amer >60 >60 mL/min   Anion gap 8 5 - 15  Clostridium Difficile by PCR     Status: None   Collection Time: 11/14/14  3:19 PM  Result Value Ref Range   C difficile by pcr NEGATIVE NEGATIVE     Dg Abd Acute W/chest  11/14/2014   CLINICAL DATA:  69 year old  female with nausea and vomiting status post cholecystectomy 11/01/14. Initial encounter.  EXAM: DG ABDOMEN ACUTE W/ 1V CHEST  COMPARISON:  Preoperative CT Abdomen and Pelvis 10/25/2014 and earlier.  FINDINGS: Normal cardiac size and mediastinal contours. No pneumothorax or pneumoperitoneum. No confluent pulmonary opacity.  Cholecystectomy clips. Air-fluid levels in the colon which is nondilated. Upper limits of normal mid abdominal small bowel loops, 3 cm diameter. Otherwise negative bowel gas pattern. Abdominal and pelvic visceral contours are within normal limits. No acute osseous abnormality identified. Degenerative changes at the right hip.  IMPRESSION: 1. Non obstructed bowel gas pattern with air-fluid levels in the colon which may indicate diarrhea or enema use. 2. No free air.  No acute cardiopulmonary abnormality.   Electronically Signed   By: Odessa FlemingH  Hall M.D.   On: 11/14/2014 07:05    ROS:  As stated above in the HPI otherwise negative.  Blood pressure 106/56, pulse 86, temperature 97.6 F (36.4 C), temperature source Oral, resp. rate 16, height 5\' 2"  (1.575 m), weight 83.689 kg (184 lb 8 oz), last menstrual period 05/10/1990, SpO2 100 %.    PE: Gen: NAD, Alert and Oriented HEENT:  [place]/AT, EOMI Neck: Supple, no LAD Lungs: CTA Bilaterally CV: RRR without M/G/R ABM: Soft, NTND, +BS Ext: No C/C/E  Assessment/Plan: 1) Diarrhea. 2) Hypokalemia. 3) Nausea/Vomiting.   The patient is much better at this time, but I am uncertain if her response will be durable.  With the severe hypokalemia I think it will be prudent to have her stay in the hospital one more day to make sure she will not have a recurrence.  She will undergo an EGD/Colonoscopy for her diarrheal complaints.  Microscopic colitis is a consideration.  As for her nausea and vomiting, it appears that meats cause her to have the symptoms.  No evidence of dysphagia.    Plan: 1) Continue with cholestyramine. 2) Continue with IV fluids. 3)  Avoid dairy. 4) Anticipate outpatient EGD/Colonoscopy.  [name] D 11/15/2014, 7:26 AM

## 2014-11-15 NOTE — Telephone Encounter (Signed)
lmomtcb x2 for pt 

## 2014-11-15 NOTE — Progress Notes (Signed)
PATIENT DETAILS Name: Bonnie Ellison Age: 69 y.o. Sex: female Date of Birth: 04-21-1946 Admit Date: 11/13/2014 Admitting Physician Eduard Clos, MD AVW:UJWJ,XBJYNWG PATRICK, MD  Subjective: One episode of vomiting yesterday, no further diarrhea.  Assessment/Plan: Principal Problem:   Hypokalemia:secondary to GI loss and HCTZ. Repleted, follow lytes.  Active Problems:   Chronic intermittent diarrhea:ongoing since March-spoke with Dr Elnoria Howard, who recommended questran. Subsequently seen by Dr Elnoria Howard on 5/3-recommends to continue supportive care for another 24 hours, if better-home 5/4 with outpatient endoscopic studies.    Nausea/Vomting:?viral synd. Supportive care and monitor for now   Mildly elevated Transaminases: improving with supportive care. Recent Cholecystectomy- doubt related to elevated LFT's. Follow.   Asymptomatic Bacteriuria:No symptoms suggestive of UTI-stopped Rocephin-monitor off Abx-remains afebrile. Follow   NFA:[id number] Avapro    Protein-calorie malnutrition, severe:continue supplements.  Disposition: Remain inpatient-home 5/4  Antimicrobial agents  See below  Anti-infectives    Start     Dose/Rate Route Frequency Ordered Stop   11/14/14 0630  cefTRIAXone (ROCEPHIN) 1 g in dextrose 5 % 50 mL IVPB - Premix  Status:  Discontinued     1 g 100 mL/hr over 30 Minutes Intravenous Every 24 hours 11/14/14 0627 11/14/14 1649   11/13/14 1845  cephALEXin (KEFLEX) capsule 500 mg     500 mg Oral  Once 11/13/14 1833 11/13/14 1914      DVT Prophylaxis: Prophylactic Lovenox   Code Status: Full code   Family Communication None at bedside  Procedures: None  CONSULTS:  GI-Dr Elnoria Howard  MEDICATIONS: Scheduled Meds: . azelastine  1 spray Each Nare BID  . cholestyramine  4 g Oral TID  . enoxaparin (LOVENOX) injection  40 mg Subcutaneous Q24H  . feeding supplement (ENSURE ENLIVE)  237 mL Oral BID BM  . fluticasone  2 spray Each  Nare BID  . irbesartan  300 mg Oral Daily  . loratadine  10 mg Oral Daily  . pantoprazole  40 mg Oral Daily   Continuous Infusions:   PRN Meds:.acetaminophen **OR** acetaminophen, albuterol, ondansetron **OR** ondansetron (ZOFRAN) IV, traMADol    PHYSICAL EXAM: Vital signs in last 24 hours: Filed Vitals:   11/14/14 1824 11/14/14 2104 11/15/14 0546 11/15/14 0900  BP: 109/65 120/76 106/56 109/59  Pulse: 61 85 86 90  Temp: 97.9 F (36.6 C) 97.8 F (36.6 C) 97.6 F (36.4 C) 98.1 F (36.7 C)  TempSrc: Oral Oral Oral Oral  Resp: Height:      Weight:      SpO2: 98% 100% 100% 100%    Weight change:  Filed Weights   11/13/14 1420 11/13/14 2152  Weight: 82.101 kg (181 lb) 83.689 kg (184 lb 8 oz)   Body mass index is 33.74 kg/(m^2).   Gen Exam: Awake and alert with clear speech. Not in any distress Neck: Supple, No JVD.   Chest: B/L Clear.  No rales CVS: S1 S2 Regular, no murmurs.  Abdomen: soft, BS +, non tender, non distended.  Extremities: no edema, lower extremities warm to touch. Neurologic: Non Focal.   Skin: No Rash.   Wounds: N/A.   Intake/Output from previous day:  Intake/Output Summary (Last 24 hours) at 11/15/14 1540 Last data filed at 11/15/14 0600  Gross per 24 hour  Intake  987.5 ml  Output    777 ml  Net  210.5 ml     LAB RESULTS: CBC  Recent  Labs Lab 11/13/14 1556 11/14/14 0053  WBC 7.7 5.6  HGB 13.0 10.6*  HCT 38.5 31.1*  PLT 256 197  MCV 81.1 81.6  MCH 27.4 27.8  MCHC 33.8 34.1  RDW 15.5 15.7*  LYMPHSABS 2.0 2.0  MONOABS 1.1* 0.8  EOSABS 0.2 0.2  BASOSABS 0.0 0.0    Chemistries   Recent Labs Lab 11/13/14 1556 11/13/14 1837 11/14/14 0053 11/14/14 0938 11/15/14 0642  NA 136 137 137 137 135  K 2.2* 2.3* 3.1* 3.5 4.2  CL 91* 90* 98* 103 101  CO2 GLUCOSE 96 81 92 91 72  BUN 5* <5*  CREATININE 0.82 0.83 0.77 0.63 0.69  CALCIUM 8.6* 8.7* 7.7* 7.5* 7.9*  MG  --   --  1.1*  --  1.7     CBG: No results for input(s): GLUCAP in the last 168 hours.  GFR Estimated Creatinine Clearance: 68.4 mL/min (by C-G formula based on Cr of 0.69).  Coagulation profile No results for input(s): INR, PROTIME in the last 168 hours.  Cardiac Enzymes No results for input(s): CKMB, TROPONINI, MYOGLOBIN in the last 168 hours.  Invalid input(s): CK  Invalid input(s): POCBNP No results for input(s): DDIMER in the last 72 hours. No results for input(s): HGBA1C in the last 72 hours. No results for input(s): CHOL, HDL, LDLCALC, TRIG, CHOLHDL, LDLDIRECT in the last 72 hours. No results for input(s): TSH, T4TOTAL, T3FREE, THYROIDAB in the last 72 hours.  Invalid input(s): FREET3 No results for input(s): VITAMINB12, FOLATE, FERRITIN, TIBC, IRON, RETICCTPCT in the last 72 hours. No results for input(s): LIPASE, AMYLASE in the last 72 hours.  Urine Studies No results for input(s): UHGB, CRYS in the last 72 hours.  Invalid input(s): UACOL, UAPR, USPG, UPH, UTP, UGL, UKET, UBIL, UNIT, UROB, ULEU, UEPI, UWBC, URBC, UBAC, CAST, UCOM, BILUA  MICROBIOLOGY: Recent Results (from the past 240 hour(s))  Urine culture     Status: None   Collection Time: 11/13/14  4:00 PM  Result Value Ref Range Status   Specimen Description URINE, CLEAN CATCH  Final   Special Requests NONE  Final   Colony Count NO GROWTH Performed at [facility]   Final   Culture NO GROWTH Performed at [facility]   Final   Report Status 11/14/2014 FINAL  Final  Clostridium Difficile by PCR     Status: None   Collection Time: 11/14/14  3:19 PM  Result Value Ref Range Status   C difficile by pcr NEGATIVE NEGATIVE Final    RADIOLOGY STUDIES/RESULTS: Ct Abdomen Pelvis W Contrast  10/25/2014   CLINICAL DATA:  Right upper quadrant abdominal pain with nausea and vomiting and weight loss since March 2016. Elevated liver function tests.  EXAM: CT ABDOMEN AND PELVIS WITH CONTRAST  TECHNIQUE: Multidetector CT  imaging of the abdomen and pelvis was performed using the standard protocol following bolus administration of intravenous contrast.  CONTRAST:  OMNIPAQUE IOHEXOL 300 MG/ML  SOLN  COMPARISON:  08/22/2014 ultrasound  FINDINGS: Lower chest:  Small type 1 hiatal hernia.  Hepatobiliary: Diffuse hepatic steatosis. 5 mm hypodense lesion in segment 8 of the liver, image 17 series 2. Distended gallbladder with multiple gallstones having nitrogen gas phenomenon. No biliary dilatation. No overt gallbladder wall thickening or pericholecystic fluid.  Pancreas: Unremarkable  Spleen: Unremarkable  Adrenals/Urinary Tract: Unremarkable  Stomach/Bowel: Scattered sigmoid colon and descending colon diverticula without active diverticulitis identified.  Vascular/Lymphatic: Unremarkable  Reproductive: Ovaries normal.  Uterus absent.  Other: No supplemental non-categorized findings.  Musculoskeletal: Severe right and moderate left degenerative hip osteoarthritis. Lower lumbar facet arthropathy and degenerative disc disease observed without overt impingement.  IMPRESSION: 1. Distended gallbladder with multiple gallstones. No definite pericholecystic fluid or gallbladder wall thickening. No biliary dilatation. Correlate clinically in assessing for cholecystitis. 2. Diffuse hepatic steatosis. 5 mm hypodense lesion in the dome of the right hepatic lobe is probably a cyst although technically nonspecific due to small size. 3. Descending and sigmoid colon diverticulosis without active diverticulitis. 4. Severe degenerative right hip osteoarthritis. 5. Lower lumbar spondylosis and degenerative disc disease without overt impingement.   Electronically Signed   By: Gaylyn RongWalter  Liebkemann M.D.   On: 10/25/2014 13:34   Dg Abd Acute W/chest  11/14/2014   CLINICAL DATA:  69 year old female with nausea and vomiting status post cholecystectomy 11/01/14. Initial encounter.  EXAM: DG ABDOMEN ACUTE W/ 1V CHEST  COMPARISON:  Preoperative CT Abdomen and  Pelvis 10/25/2014 and earlier.  FINDINGS: Normal cardiac size and mediastinal contours. No pneumothorax or pneumoperitoneum. No confluent pulmonary opacity.  Cholecystectomy clips. Air-fluid levels in the colon which is nondilated. Upper limits of normal mid abdominal small bowel loops, 3 cm diameter. Otherwise negative bowel gas pattern. Abdominal and pelvic visceral contours are within normal limits. No acute osseous abnormality identified. Degenerative changes at the right hip.  IMPRESSION: 1. Non obstructed bowel gas pattern with air-fluid levels in the colon which may indicate diarrhea or enema use. 2. No free air.  No acute cardiopulmonary abnormality.   Electronically Signed   By: Odessa FlemingH  Hall M.D.   On: 11/14/2014 07:05    Jeoffrey MassedGHIMIRE,Marisal Swarey, MD  [facility] Pager:[phone]  If 7PM-7AM, please contact night-coverage [email or web address] Password [email or web address] 11/15/2014, 3:40 PM   LOS: 2 days

## 2014-11-15 NOTE — Progress Notes (Signed)
UR completed 

## 2014-11-16 DIAGNOSIS — E876 Hypokalemia: Secondary | ICD-10-CM | POA: Diagnosis not present

## 2014-11-16 DIAGNOSIS — E43 Unspecified severe protein-calorie malnutrition: Secondary | ICD-10-CM | POA: Diagnosis not present

## 2014-11-16 DIAGNOSIS — I10 Essential (primary) hypertension: Secondary | ICD-10-CM | POA: Diagnosis not present

## 2014-11-16 DIAGNOSIS — R112 Nausea with vomiting, unspecified: Secondary | ICD-10-CM | POA: Diagnosis not present

## 2014-11-16 LAB — BASIC METABOLIC PANEL
Anion gap: 9 (ref 5–15)
BUN: <5 mg/dL — ABNORMAL LOW (ref 6–20)
CO2: 23 mmol/L (ref 22–32)
Calcium: 8.3 mg/dL — ABNORMAL LOW (ref 8.9–10.3)
Chloride: 105 mmol/L (ref 101–111)
Creatinine, Ser: 0.67 mg/dL (ref 0.44–1.00)
GFR calc Af Amer: >60 mL/min (ref >60)
GFR calc non Af Amer: >60 mL/min (ref >60)
Glucose, Bld: 75 mg/dL (ref 70–99)
Potassium: 3.8 mmol/L (ref 3.5–5.1)
Sodium: 137 mmol/L (ref 135–145)

## 2014-11-16 MED ORDER — ONDANSETRON 4 MG PO TBDP: 4.0000 mg | ORAL_TABLET | Freq: Three times a day (TID) | ORAL | Status: DC | PRN

## 2014-11-16 MED ORDER — CHOLESTYRAMINE 4 G PO PACK: 4.0000 g | PACK | Freq: Three times a day (TID) | ORAL | Status: DC

## 2014-11-16 MED ORDER — ENSURE ENLIVE PO LIQD: 237.0000 mL | Freq: Two times a day (BID) | ORAL | Status: DC

## 2014-11-16 MED ORDER — IRBESARTAN 300 MG PO TABS: 300.0000 mg | ORAL_TABLET | Freq: Every day | ORAL | Status: DC

## 2014-11-16 NOTE — Progress Notes (Addendum)
  Pt observation, no Medicare Message required. Spoke to pt and no dc needs. Isidoro DonningAlesia Sachi Boulay RN CCM Case Mgmt phone [phone]

## 2014-11-16 NOTE — Progress Notes (Signed)
Subjective: Feels better.  She had a total of 3 small diarrheal bowel movements.  No further nausea or vomiting.  Objective: Vital signs in last 24 hours: Temp:  [98 F (36.7 C)-98.2 F (36.8 C)] 98 F (36.7 C) (05/04 0555) Pulse Rate:  [78-90] 79 (05/04 0555) Resp:  [16-18] 18 (05/04 0555) BP: (92-115)/(54-67) 92/67 mmHg (05/04 0555) SpO2:  [98 %-100 %] 100 % (05/04 0555) Weight:  [83.54 kg (184 lb 2.8 oz)] 83.54 kg (184 lb 2.8 oz) (05/03 2049) Last BM Date: 11/15/14  Intake/Output from previous day: 05/03 0701 - 05/04 0700 In: 480 [P.O.:480] Out: 0  Intake/Output this shift:    General appearance: alert and no distress GI: soft, non-tender; bowel sounds normal; no masses,  no organomegaly  Lab Results:  Recent Labs  11/13/14 1556 11/14/14 0053  WBC 7.7 5.6  HGB 13.0 10.6*  HCT 38.5 31.1*  PLT 256 197   BMET  Recent Labs  11/14/14 0938 11/15/14 0642 11/16/14 0529  NA 137 135 137  K 3.5 4.2 3.8  CL 103 101 105  CO2 26 25 23   GLUCOSE 91 72 75  BUN 5* <5* <5*  CREATININE 0.63 0.69 0.67  CALCIUM 7.5* 7.9* 8.3*   LFT  Recent Labs  11/14/14 0938  PROT 4.5*  ALBUMIN 2.2*  AST 50*  ALT 41  ALKPHOS 45  BILITOT 1.0  BILIDIR 0.2  IBILI 0.8   PT/INR No results for input(s): LABPROT, INR in the last 72 hours. Hepatitis Panel No results for input(s): HEPBSAG, HCVAB, HEPAIGM, HEPBIGM in the last 72 hours. C-Diff No results for input(s): CDIFFTOX in the last 72 hours. Fecal Lactopherrin No results for input(s): FECLLACTOFRN in the last 72 hours.  Studies/Results: No results found.  Medications:  Scheduled: . azelastine  1 spray Each Nare BID  . cholestyramine  4 g Oral TID  . enoxaparin (LOVENOX) injection  40 mg Subcutaneous Q24H  . feeding supplement (ENSURE ENLIVE)  237 mL Oral BID BM  . fluticasone  2 spray Each Nare BID  . irbesartan  300 mg Oral Daily  . loratadine  10 mg Oral Daily  . pantoprazole  40 mg Oral Daily   Continuous:    Assessment/Plan: 1) Diarrhea - Improved. 2) Nausea/Vomiting - Resolved.   I still have a hard time discerning if she is better from the diarrheal standpoint.  She goes back and forth with the volume and frequency of her diarrhea, but overall she feels better.  Her main concern was with the nausea and vomiting, which has resolved.  Her potassium remains stable.  Plan: 1) Okay to D/C home. 2) Remain on cholestyramine. 3) I will arrange for her to undergo the EGD/Colonoscopy as an outpatient. 4) Avoid all dairy products.   LOS: 3 days   Tyrome Donatelli D 11/16/2014, 8:09 AM

## 2014-11-16 NOTE — Progress Notes (Signed)
Patient Discharge: Disposition: patient discharged to home. Education: Educated about medications, prescriptions, follow-up appointments, discharge instructions, and also about hypokalemia, patient understood and acknowledged. IV: Discontinued before discharge. Telemetry: N/A Transportation: Patient transported in w/c accompanied by the husband and the staff. Belongings: patient took all her belongings with her.

## 2014-11-16 NOTE — Discharge Summary (Signed)
PATIENT DETAILS Name: Bonnie Ellison Age: 69 y.o. Sex: female Date of Birth: [birth year] MRN: [id number]. Admitting Physician: Eduard ClosArshad N Kakrakandy, MD NWG:NFAO,ZHYQMVHPCP:WONG,Bonnie [name], MD  Admit Date: 11/13/2014 Discharge date: 11/16/2014  Recommendations for Outpatient Follow-up:  1. Ensure follow-up with Dr. Elnoria HowardHung for further workup of chronic diarrhea and intermittent vomiting. 2. Note-HCTZ discontinued because of risk of hypokalemia. 3. Please check CBC and chemistries at next follow-up with PCP  4. New medication: Questran for diarrhea  PRIMARY DISCHARGE DIAGNOSIS:  Principal Problem:   Hypokalemia Active Problems:   Allergic-infective asthma   Nausea vomiting and diarrhea   Essential hypertension   Protein-calorie malnutrition, severe      PAST MEDICAL HISTORY: Past Medical History  Diagnosis Date  . Other hyperalimentation   . Urticaria   . Other chronic sinusitis   . Allergic rhinitis, cause unspecified   . Unspecified asthma(493.90)   . Chronic airway obstruction, not elsewhere classified   . GERD (gastroesophageal reflux disease)   . Hypertension   . Acid reflux   . Vertigo     DISCHARGE MEDICATIONS: Current Discharge Medication List    START taking these medications   Details  cholestyramine (QUESTRAN) 4 G packet Take 1 packet (4 g total) by mouth 3 (three) times daily. Qty: 90 each, Refills: 0    feeding supplement, ENSURE ENLIVE, (ENSURE ENLIVE) LIQD Take 237 mLs by mouth 2 (two) times daily between meals. Qty: 60 Bottle, Refills: 12    irbesartan (AVAPRO) 300 MG tablet Take 1 tablet (300 mg total) by mouth daily. Qty: 30 tablet, Refills: 0      CONTINUE these medications which have CHANGED   Details  ondansetron (ZOFRAN-ODT) 4 MG disintegrating tablet Take 1 tablet (4 mg total) by mouth every 8 (eight) hours as needed for nausea or vomiting. Qty: 20 tablet, Refills: 0      CONTINUE these medications which have NOT CHANGED   Details  acetaminophen  (TYLENOL) 325 MG tablet Take 650 mg by mouth every 6 (six) hours as needed for mild pain.     Azelastine-Fluticasone (DYMISTA) 137-50 MCG/ACT SUSP Place 1 Inhaler into the nose once. Qty: 1 Bottle, Refills: 0    Cholecalciferol (VITAMIN D) 2000 UNITS tablet Take 2,000 Units by mouth daily.    estradiol (ESTRACE) 0.5 MG tablet TAKE 1 TABLET (0.5 MG TOTAL) BY MOUTH DAILY. Qty: 30 tablet, Refills: 2    fexofenadine (ALLEGRA) 180 MG tablet Take 180 mg by mouth daily.      fluticasone (FLONASE) 50 MCG/ACT nasal spray Place 2 sprays into both nostrils 2 (two) times daily. Qty: 16 g, Refills: 6    nystatin-triamcinolone ointment (MYCOLOG) Apply 1 application topically 2 (two) times daily. Qty: 30 g, Refills: 1    omeprazole (PRILOSEC) 40 MG capsule Take 40 mg by mouth daily as needed (heartburn).     PROAIR HFA 108 (90 BASE) MCG/ACT inhaler USE 2 PUFFS BY MOUTH EVERY 6 HOURS AS NEEDED FOR WHEEZING AND SHORTNESS OF BREATH Qty: 8.5 Inhaler, Refills: 0    traMADol (ULTRAM) 50 MG tablet Take 1 tablet (50 mg total) by mouth every 6 (six) hours as needed for moderate pain or severe pain. Qty: 30 tablet, Refills: 0      STOP taking these medications     olmesartan-hydrochlorothiazide (BENICAR HCT) 40-12.5 MG per tablet         ALLERGIES:   Allergies  Allergen Reactions  . Shellfish Allergy     throat swelling, hives  . Wheat  Bran Itching    BRIEF HPI:  See H&P, Labs, Consult and Test reports for all details in brief, patient is a 69 year old female who was admitted for evaluation of persistent nausea, vomiting and diarrhea. She was found to be hypokalemic. She was then admitted for further evaluation and treatment  CONSULTATIONS:   GI  PERTINENT RADIOLOGIC STUDIES: Ct Abdomen Pelvis W Contrast  10/25/2014   CLINICAL DATA:  Right upper quadrant abdominal pain with nausea and vomiting and weight loss since March 2016. Elevated liver function tests.  EXAM: CT ABDOMEN AND PELVIS WITH  CONTRAST  TECHNIQUE: Multidetector CT imaging of the abdomen and pelvis was performed using the standard protocol following bolus administration of intravenous contrast.  CONTRAST:  OMNIPAQUE IOHEXOL 300 MG/ML  SOLN  COMPARISON:  08/22/2014 ultrasound  FINDINGS: Lower chest:  Small type 1 hiatal hernia.  Hepatobiliary: Diffuse hepatic steatosis. 5 mm hypodense lesion in segment 8 of the liver, image 17 series 2. Distended gallbladder with multiple gallstones having nitrogen gas phenomenon. No biliary dilatation. No overt gallbladder wall thickening or pericholecystic fluid.  Pancreas: Unremarkable  Spleen: Unremarkable  Adrenals/Urinary Tract: Unremarkable  Stomach/Bowel: Scattered sigmoid colon and descending colon diverticula without active diverticulitis identified.  Vascular/Lymphatic: Unremarkable  Reproductive: Ovaries normal.  Uterus absent.  Other: No supplemental non-categorized findings.  Musculoskeletal: Severe right and moderate left degenerative hip osteoarthritis. Lower lumbar facet arthropathy and degenerative disc disease observed without overt impingement.  IMPRESSION: 1. Distended gallbladder with multiple gallstones. No definite pericholecystic fluid or gallbladder wall thickening. No biliary dilatation. Correlate clinically in assessing for cholecystitis. 2. Diffuse hepatic steatosis. 5 mm hypodense lesion in the dome of the right hepatic lobe is probably a cyst although technically nonspecific due to small size. 3. Descending and sigmoid colon diverticulosis without active diverticulitis. 4. Severe degenerative right hip osteoarthritis. 5. Lower lumbar spondylosis and degenerative disc disease without overt impingement.   Electronically Signed   By: Gaylyn Rong M.D.   On: 10/25/2014 13:34   Dg Abd Acute W/chest  11/14/2014   CLINICAL DATA:  69 year old female with nausea and vomiting status post cholecystectomy 11/01/14. Initial encounter.  EXAM: DG ABDOMEN ACUTE W/ 1V CHEST   COMPARISON:  Preoperative CT Abdomen and Pelvis 10/25/2014 and earlier.  FINDINGS: Normal cardiac size and mediastinal contours. No pneumothorax or pneumoperitoneum. No confluent pulmonary opacity.  Cholecystectomy clips. Air-fluid levels in the colon which is nondilated. Upper limits of normal mid abdominal small bowel loops, 3 cm diameter. Otherwise negative bowel gas pattern. Abdominal and pelvic visceral contours are within normal limits. No acute osseous abnormality identified. Degenerative changes at the right hip.  IMPRESSION: 1. Non obstructed bowel gas pattern with air-fluid levels in the colon which may indicate diarrhea or enema use. 2. No free air.  No acute cardiopulmonary abnormality.   Electronically Signed   By: Odessa Fleming M.D.   On: 11/14/2014 07:05     PERTINENT LAB RESULTS: CBC:  Recent Labs  11/13/14 1556 11/14/14 0053  WBC 7.7 5.6  HGB 13.0 10.6*  HCT 38.5 31.1*  PLT 256 197   CMET CMP     Component Value Date/Time   NA 137 11/16/2014 0529   K 3.8 11/16/2014 0529   CL 105 11/16/2014 0529   CO2 23 11/16/2014 0529   GLUCOSE 75 11/16/2014 0529   BUN <5* 11/16/2014 0529   CREATININE 0.67 11/16/2014 0529   CALCIUM 8.3* 11/16/2014 0529   PROT 4.5* 11/14/2014 0938   ALBUMIN 2.2* 11/14/2014 6045  AST 50* 11/14/2014 0938   ALT 41 11/14/2014 0938   ALKPHOS 45 11/14/2014 0938   BILITOT 1.0 11/14/2014 0938   GFRNONAA >60 11/16/2014 0529   GFRAA >60 11/16/2014 0529    GFR Estimated Creatinine Clearance: 68.4 mL/min (by C-G formula based on Cr of 0.67). No results for input(s): LIPASE, AMYLASE in the last 72 hours. No results for input(s): CKTOTAL, CKMB, CKMBINDEX, TROPONINI in the last 72 hours. Invalid input(s): POCBNP No results for input(s): DDIMER in the last 72 hours. No results for input(s): HGBA1C in the last 72 hours. No results for input(s): CHOL, HDL, LDLCALC, TRIG, CHOLHDL, LDLDIRECT in the last 72 hours. No results for input(s): TSH, T4TOTAL, T3FREE,  THYROIDAB in the last 72 hours.  Invalid input(s): FREET3 No results for input(s): VITAMINB12, FOLATE, FERRITIN, TIBC, IRON, RETICCTPCT in the last 72 hours. Coags: No results for input(s): INR in the last 72 hours.  Invalid input(s): PT Microbiology: Recent Results (from the past 240 hour(s))  Urine culture     Status: None   Collection Time: 11/13/14  4:00 PM  Result Value Ref Range Status   Specimen Description URINE, CLEAN CATCH  Final   Special Requests NONE  Final   Colony Count NO GROWTH Performed at [facility]   Final   Culture NO GROWTH Performed at [facility]   Final   Report Status 11/14/2014 FINAL  Final  Clostridium Difficile by PCR     Status: None   Collection Time: 11/14/14  3:19 PM  Result Value Ref Range Status   C difficile by pcr NEGATIVE NEGATIVE Final  Stool culture     Status: None (Preliminary result)   Collection Time: 11/14/14  3:19 PM  Result Value Ref Range Status   Specimen Description STOOL  Final   Special Requests NONE  Final   Culture   Final    NO SUSPICIOUS COLONIES, CONTINUING TO HOLD Performed at [facility]    Report Status PENDING  Incomplete     BRIEF HOSPITAL COURSE:     Hypokalemia:secondary to GI loss and HCTZ. Repleted, and subsequently normalized. HCTZ has been discontinued. Please continue to monitor chemistries closely in the outpatient setting  Active Problems:  Chronic intermittent diarrhea:ongoing since March-spoke with Dr Elnoria Howard, who recommended questran.evaluated by Dr. Elnoria Howard during this admission, plans are for outpatient endoscopic evaluation by Dr. Elnoria Howard. Diarrhea is so much better with Questran-per patient only 1 BM this morning.   Nausea/Vomting:?viral synd. Resolved. Supportive care and monitor for now  Mildly elevated Transaminases: improving with supportive care. Recent Cholecystectomy- doubt related to elevated LFT's. Follow.  Asymptomatic Bacteriuria:No symptoms suggestive of  UTI. Monitor off antibiotics, urine culture negative.  JWJ:[id number] Avapro   Protein-calorie malnutrition, severe:continue supplements on discharge  TODAY-DAY OF DISCHARGE:  Subjective:   Bonnie Ellison today has no headache,no chest abdominal pain,no new weakness tingling or numbness, feels much better wants to go home today.   Objective:   Blood pressure 107/54, pulse 83, temperature 98.4 F (36.9 C), temperature source Oral, resp. rate 18, height  (1.575 m), weight 83.54 kg (184 lb 2.8 oz), last menstrual period 05/10/1990, SpO2 100 %.  Intake/Output Summary (Last 24 hours) at 11/16/14 1109 Last data filed at 11/16/14 1025  Gross per 24 hour  Intake    600 ml  Output      0 ml  Net    600 ml   Filed Weights   11/13/14 1420 11/13/14 2152 11/15/14 2049  Weight: 82.101 kg (181 lb) 83.689 kg (184 lb 8 oz) 83.54 kg (184 lb 2.8 oz)    Exam Awake Alert, Oriented *3, No new F.N deficits, Normal affect [place].AT,PERRAL Supple Neck,No JVD, No cervical lymphadenopathy appriciated.  Symmetrical Chest wall movement, Good air movement bilaterally, CTAB RRR,No Gallops,Rubs or new Murmurs, No Parasternal Heave +ve B.Sounds, Abd Soft, Non tender, No organomegaly appriciated, No rebound -guarding or rigidity. No Cyanosis, Clubbing or edema, No new Rash or bruise  DISCHARGE CONDITION: Stable  DISPOSITION: Home  DISCHARGE INSTRUCTIONS:    Activity:  As tolerated   Diet recommendation: Heart Healthy diet  Discharge Instructions    Call MD for:  persistant nausea and vomiting    Complete by:  As directed      Call MD for:    Complete by:  As directed   Worsening diarrhea     Diet - low sodium heart healthy    Complete by:  As directed      Increase activity slowly    Complete by:  As directed            Follow-up Information    Follow up with Redmond BasemanWONG,Bonnie PATRICK, MD. Schedule an appointment as soon as possible for a visit in 1 week.   Specialty:  Family  Medicine   Why:  Appointment Wednesday, Nov 23, 2014 @ 11:30AM; Arrive by 11:15AM   Contact information:   [address] [place] [address] [phone]       Follow up with HUNG,Ellison D, MD. Schedule an appointment as soon as possible for a visit in 2 weeks.   Specialty:  Gastroenterology   Why:  Appointment Wednesday, Nov 30, 2014 @ 9:45AM; Arrive by 9:30AM   Contact information:   [address], [address] [place] [address] [phone]      Total Time spent on discharge equals 25 minutes  Signed: Jeoffrey MassedGHIMIRE,Tomy Khim 11/16/2014 11:09 AM

## 2014-11-17 LAB — STOOL CULTURE

## 2014-11-22 ENCOUNTER — Telehealth: Payer: Self-pay | Admitting: Internal Medicine

## 2014-11-22 NOTE — Telephone Encounter (Signed)
I called pt. Hoping she was home from the hosp.. I lmom asking her to call back when she is able so we can discussed restarting her allergy shots. Will leave encounter open until pt. Calls back.

## 2014-11-23 NOTE — Telephone Encounter (Signed)
Pt. Called today, I gave her Dr. Roxy CedarYoung's recs from the original ph. Note, Pt. Understood. She is coming in Fri. For a shot. Nothing further needed.

## 2014-11-25 ENCOUNTER — Ambulatory Visit (INDEPENDENT_AMBULATORY_CARE_PROVIDER_SITE_OTHER): Payer: Medicare Other

## 2014-11-25 DIAGNOSIS — J309 Allergic rhinitis, unspecified: Secondary | ICD-10-CM

## 2014-11-29 ENCOUNTER — Ambulatory Visit (INDEPENDENT_AMBULATORY_CARE_PROVIDER_SITE_OTHER): Payer: Medicare Other

## 2014-11-29 DIAGNOSIS — J309 Allergic rhinitis, unspecified: Secondary | ICD-10-CM | POA: Diagnosis not present

## 2014-12-02 ENCOUNTER — Ambulatory Visit (INDEPENDENT_AMBULATORY_CARE_PROVIDER_SITE_OTHER): Payer: Medicare Other

## 2014-12-02 DIAGNOSIS — J309 Allergic rhinitis, unspecified: Secondary | ICD-10-CM | POA: Diagnosis not present

## 2014-12-09 ENCOUNTER — Ambulatory Visit (INDEPENDENT_AMBULATORY_CARE_PROVIDER_SITE_OTHER): Payer: Medicare Other

## 2014-12-09 DIAGNOSIS — J309 Allergic rhinitis, unspecified: Secondary | ICD-10-CM | POA: Diagnosis not present

## 2014-12-15 ENCOUNTER — Emergency Department (HOSPITAL_COMMUNITY)
Admission: EM | Admit: 2014-12-15 | Discharge: 2014-12-15 | Disposition: A | Discharge status: Home / Self Care | Payer: Medicare Other | Attending: Emergency Medicine | Admitting: Emergency Medicine

## 2014-12-15 ENCOUNTER — Encounter (HOSPITAL_COMMUNITY): Payer: Self-pay | Admitting: Emergency Medicine

## 2014-12-15 DIAGNOSIS — Z872 Personal history of diseases of the skin and subcutaneous tissue: Secondary | ICD-10-CM | POA: Diagnosis not present

## 2014-12-15 DIAGNOSIS — J449 Chronic obstructive pulmonary disease, unspecified: Secondary | ICD-10-CM | POA: Diagnosis not present

## 2014-12-15 DIAGNOSIS — Z7951 Long term (current) use of inhaled steroids: Secondary | ICD-10-CM | POA: Insufficient documentation

## 2014-12-15 DIAGNOSIS — I10 Essential (primary) hypertension: Secondary | ICD-10-CM | POA: Insufficient documentation

## 2014-12-15 DIAGNOSIS — K219 Gastro-esophageal reflux disease without esophagitis: Secondary | ICD-10-CM | POA: Insufficient documentation

## 2014-12-15 DIAGNOSIS — Z79899 Other long term (current) drug therapy: Secondary | ICD-10-CM | POA: Insufficient documentation

## 2014-12-15 DIAGNOSIS — R7989 Other specified abnormal findings of blood chemistry: Secondary | ICD-10-CM | POA: Diagnosis present

## 2014-12-15 DIAGNOSIS — E876 Hypokalemia: Secondary | ICD-10-CM | POA: Diagnosis not present

## 2014-12-15 HISTORY — DX: Celiac disease: K90.0

## 2014-12-15 MED ORDER — POTASSIUM CHLORIDE CRYS ER 20 MEQ PO TBCR
40.0000 meq | EXTENDED_RELEASE_TABLET | Freq: Once | ORAL | Status: AC
  Administered 2014-12-15: 40 meq via ORAL
  Filled 2014-12-15: qty 2

## 2014-12-15 MED ORDER — SODIUM CHLORIDE 0.9 % IV BOLUS (SEPSIS): 1000.0000 mL | Freq: Once | INTRAVENOUS | Status: DC

## 2014-12-15 MED ORDER — CALCIUM CARBONATE ANTACID 500 MG PO CHEW
400.0000 mg | CHEWABLE_TABLET | Freq: Once | ORAL | Status: AC
  Administered 2014-12-15: 400 mg via ORAL
  Filled 2014-12-15: qty 2

## 2014-12-15 NOTE — ED Provider Notes (Signed)
CSN: [id number]     Arrival date & time 12/15/14  1735 History   First MD Initiated Contact with Patient 12/15/14 2002     Chief Complaint  Patient presents with  . abnormal labs      (Consider location/radiation/quality/duration/timing/severity/associated sxs/prior Treatment) Patient is a 69 y.o. female presenting with general illness. The history is provided by the patient.  Illness Location:  Generalized Quality:  Hypocalcemia, hypokalemia Severity:  Mild Onset quality:  Gradual Timing:  Constant Progression:  Unchanged Chronicity:  New Context:  [facility] show calcium of 6.9, K of 3.2 Relieved by:  Nothing Worsened by:  Nothing Associated symptoms: no chest pain, no cough, no fever and no shortness of breath     Past Medical History  Diagnosis Date  . Other hyperalimentation   . Urticaria   . Other chronic sinusitis   . Allergic rhinitis, cause unspecified   . Unspecified asthma(493.90)   . Chronic airway obstruction, not elsewhere classified   . GERD (gastroesophageal reflux disease)   . Hypertension   . Acid reflux   . Vertigo   . Celiac disease    Past Surgical History  Procedure Laterality Date  . Total abdominal hysterectomy w/ bilateral salpingoophorectomy    . Cesarean section    . Eye surgery      DETACHED RETINA RIGHT EYE...   . Cataract extraction      RIGHT EYE  . Abdominal hysterectomy  1991    ABDOMINAL HYSTERECTOMY  leiomyomata  . Cholecystectomy N/A 10/31/2014    Procedure: LAPAROSCOPIC CHOLECYSTECTOMY;  Surgeon: Emelia LoronMatthew Wakefield, MD;  [facility];  Service: General;  Laterality: N/A;   Family History  Problem Relation Age of Onset  . Heart failure Mother   . Heart disease Mother   . Pancreatic cancer Father   . Cancer Father [id number]    PANCREATIC (DECEASED)  . Diabetes Brother     DEACESED.Marland Kitchen. HAD A KIDNEY TRANSPLANT  . Stroke Brother [id number]    DECEASED  . Breast cancer Sister [id number]   History  Substance Use Topics  . Smoking  status: Never Smoker   . Smokeless tobacco: Never Used  . Alcohol Use: No   OB History    Gravida Para Term Preterm AB TAB SAB Ectopic Multiple Living   2 2 2       2      Review of Systems  Constitutional: Negative for fever.  Respiratory: Negative for cough and shortness of breath.   Cardiovascular: Negative for chest pain.  All other systems reviewed and are negative.     Allergies  Shellfish allergy and Wheat bran  Home Medications   Prior to Admission medications   Medication Sig Start Date End Date Taking? Authorizing Provider  acetaminophen (TYLENOL) 325 MG tablet Take 650 mg by mouth every 6 (six) hours as needed for mild pain.    Yes Historical Provider, MD  Cholecalciferol (VITAMIN D) 2000 UNITS tablet Take 2,000 Units by mouth daily.   Yes Historical Provider, MD  fexofenadine (ALLEGRA) 180 MG tablet Take 180 mg by mouth daily.     Yes Historical Provider, MD  fluticasone (FLONASE) 50 MCG/ACT nasal spray Place 2 sprays into both nostrils 2 (two) times daily. 03/18/14  Yes Waymon Budgelinton D Young, MD  irbesartan (AVAPRO) 300 MG tablet Take 1 tablet (300 mg total) by mouth daily. 11/16/14  Yes Shanker Levora DredgeM Ghimire, MD  omeprazole (PRILOSEC) 40 MG capsule Take 40 mg by mouth daily as needed (heartburn).  Yes Historical Provider, MD  ondansetron (ZOFRAN-ODT) 4 MG disintegrating tablet Take 1 tablet (4 mg total) by mouth every 8 (eight) hours as needed for nausea or vomiting. 11/16/14  Yes Shanker Levora Dredge, MD  PROAIR HFA 108 (90 BASE) MCG/ACT inhaler USE 2 PUFFS BY MOUTH EVERY 6 HOURS AS NEEDED FOR WHEEZING AND SHORTNESS OF BREATH 11/03/14  Yes Waymon Budge, MD  Azelastine-Fluticasone (DYMISTA) 137-50 MCG/ACT SUSP Place 1 Inhaler into the nose once. Patient not taking: Reported on 12/15/2014 05/09/14   Waymon Budge, MD  estradiol (ESTRACE) 0.5 MG tablet TAKE 1 TABLET (0.5 MG TOTAL) BY MOUTH DAILY. 07/11/14   Dara Lords, MD  traMADol (ULTRAM) 50 MG tablet Take 1 tablet (50 mg  total) by mouth every 6 (six) hours as needed for moderate pain or severe pain. Patient not taking: Reported on 12/15/2014 11/01/14   Emina Riebock, NP   BP 127/77 mmHg  Pulse 93  Temp(Src) 98.1 F (36.7 C) (Oral)  Resp 18  Ht  (1.6 m)  Wt 186 lb (84.369 kg)  BMI 32.96 kg/m2  SpO2 100%  LMP 05/10/1990 Physical Exam  Constitutional: She is oriented to person, place, and time. She appears well-developed and well-nourished. No distress.  HENT:  Head: Normocephalic and atraumatic.  Mouth/Throat: Oropharynx is clear and moist.  Eyes: EOM are normal. Pupils are equal, round, and reactive to light.  Neck: Normal range of motion. Neck supple.  Cardiovascular: Normal rate and regular rhythm.  Exam reveals no friction rub.   No murmur heard. Pulmonary/Chest: Effort normal and breath sounds normal. No respiratory distress. She has no wheezes. She has no rales.  Abdominal: Soft. She exhibits no distension. There is no tenderness. There is no rebound.  Musculoskeletal: Normal range of motion. She exhibits no edema.  Neurological: She is alert and oriented to person, place, and time.  Skin: No rash noted. She is not diaphoretic.  Nursing note and vitals reviewed.   ED Course  Procedures (including critical care time) Labs Review Labs Reviewed - No data to display  Imaging Review No results found.   EKG Interpretation None      MDM   Final diagnoses:  Hypocalcemia  Hypokalemia   59F here with high flow calcium he appears in from [facility]. Calcium 6.9. She is fair well-appearing. Potassium is also 3.2. I do not think patient needs acute IV replacement. Homes given. Potassium supplements given by mouth. Instructed to start taking calcium supplementation and follow-up with her doctor in a week. He has no evidence of tetany or facial twitching.    Elwin Mocha, MD 12/15/14 2152

## 2014-12-15 NOTE — ED Notes (Signed)
Pt states dr was supposed to fax lab results here.

## 2014-12-15 NOTE — ED Notes (Signed)
Pt remains monitored by blood pressure and pulse ox. pts family remains at bedside.  

## 2014-12-15 NOTE — ED Notes (Signed)
Sent by dr's office because of abnormal calcium level. Was seen at dr's office for swelling in ankles and feet-- was fitted for compression stockings today--

## 2014-12-15 NOTE — Discharge Instructions (Signed)
Please continue your potassium supplements. Please begin taking a calcium supplement.  Calcium Intake Recommendations Calcium in our blood is important for the control of many things, such as:  Blood clotting.  Conducting of nerve impulses.  Muscle contraction.  Maintaining teeth and bone health.  Other body functions. Age group / Amount of calcium to consume daily, in milligrams (mg)  Birth to 6 months / 200 mg  Infants 7 to 12 months / 260 mg  Children 1 to 3 years / 700 mg  Children 4 to 8 years / 1,000 mg  Children 9 to 13 years / 1,300 mg  Teens 14 to 18 years / 1,300 mg  Adults 19 to 50 years / 1,000 mg  Adult women 51 to 70 years / 1,200 mg  Adults 71 years and older / 1,200 mg  Pregnant and breastfeeding teens / 1,300 mg  Pregnant and breastfeeding adults / 1,000 mg Document Released: 02/13/2004 Document Revised: 10/26/2012 Document Reviewed: 07/01/2005 ExitCare Patient Information 2015 [employer]. This information is not intended to replace advice given to you by your health care provider. Make sure you discuss any questions you have with your health care provider.

## 2014-12-16 ENCOUNTER — Ambulatory Visit (INDEPENDENT_AMBULATORY_CARE_PROVIDER_SITE_OTHER): Payer: Medicare Other

## 2014-12-16 DIAGNOSIS — J309 Allergic rhinitis, unspecified: Secondary | ICD-10-CM | POA: Diagnosis not present

## 2014-12-21 ENCOUNTER — Encounter: Payer: Self-pay | Admitting: Internal Medicine

## 2014-12-23 ENCOUNTER — Ambulatory Visit (INDEPENDENT_AMBULATORY_CARE_PROVIDER_SITE_OTHER): Payer: Medicare Other

## 2014-12-23 DIAGNOSIS — J309 Allergic rhinitis, unspecified: Secondary | ICD-10-CM

## 2014-12-30 ENCOUNTER — Ambulatory Visit (INDEPENDENT_AMBULATORY_CARE_PROVIDER_SITE_OTHER): Payer: Medicare Other

## 2014-12-30 DIAGNOSIS — J309 Allergic rhinitis, unspecified: Secondary | ICD-10-CM | POA: Diagnosis not present

## 2015-01-06 ENCOUNTER — Ambulatory Visit (INDEPENDENT_AMBULATORY_CARE_PROVIDER_SITE_OTHER): Payer: Medicare Other

## 2015-01-06 DIAGNOSIS — J309 Allergic rhinitis, unspecified: Secondary | ICD-10-CM | POA: Diagnosis not present

## 2015-01-10 ENCOUNTER — Encounter: Payer: Self-pay | Admitting: Internal Medicine

## 2015-01-10 ENCOUNTER — Other Ambulatory Visit (INDEPENDENT_AMBULATORY_CARE_PROVIDER_SITE_OTHER): Payer: Medicare Other

## 2015-01-10 ENCOUNTER — Ambulatory Visit (INDEPENDENT_AMBULATORY_CARE_PROVIDER_SITE_OTHER): Payer: Medicare Other | Admitting: Internal Medicine

## 2015-01-10 VITALS — BP 154/88 | HR 109 | Ht 63.0 in | Wt 194.0 lb

## 2015-01-10 DIAGNOSIS — J4521 Mild intermittent asthma with (acute) exacerbation: Secondary | ICD-10-CM

## 2015-01-10 DIAGNOSIS — J309 Allergic rhinitis, unspecified: Secondary | ICD-10-CM | POA: Diagnosis not present

## 2015-01-10 DIAGNOSIS — D869 Sarcoidosis, unspecified: Secondary | ICD-10-CM | POA: Diagnosis not present

## 2015-01-10 DIAGNOSIS — J45998 Other asthma: Secondary | ICD-10-CM

## 2015-01-10 DIAGNOSIS — J328 Other chronic sinusitis: Secondary | ICD-10-CM

## 2015-01-10 DIAGNOSIS — K900 Celiac disease: Secondary | ICD-10-CM | POA: Diagnosis not present

## 2015-01-10 DIAGNOSIS — J302 Other seasonal allergic rhinitis: Secondary | ICD-10-CM

## 2015-01-10 DIAGNOSIS — J3089 Other allergic rhinitis: Principal | ICD-10-CM

## 2015-01-10 LAB — CBC WITH DIFFERENTIAL/PLATELET
Basophils Absolute: 0 10*3/uL (ref 0.0–0.1)
Basophils Relative: 0.5 % (ref 0.0–3.0)
Eosinophils Absolute: 0.2 10*3/uL (ref 0.0–0.7)
Eosinophils Relative: 4 % (ref 0.0–5.0)
HCT: 35.3 % — ABNORMAL LOW (ref 36.0–46.0)
Hemoglobin: 11.6 g/dL — ABNORMAL LOW (ref 12.0–15.0)
Lymphocytes Relative: 35.6 % (ref 12.0–46.0)
Lymphs Abs: 1.8 10*3/uL (ref 0.7–4.0)
MCHC: 32.9 g/dL (ref 30.0–36.0)
MCV: 87.2 fl (ref 78.0–100.0)
Monocytes Absolute: 0.6 10*3/uL (ref 0.1–1.0)
Monocytes Relative: 12.7 % — ABNORMAL HIGH (ref 3.0–12.0)
Neutro Abs: 2.3 10*3/uL (ref 1.4–7.7)
Neutrophils Relative %: 47.2 % (ref 43.0–77.0)
Platelets: 213 10*3/uL (ref 150.0–400.0)
RBC: 4.04 Mil/uL (ref 3.87–5.11)
RDW: 17.1 % — ABNORMAL HIGH (ref 11.5–15.5)
WBC: 5 10*3/uL (ref 4.0–10.5)

## 2015-01-10 NOTE — Patient Instructions (Signed)
Order- CBC w diff, ACE level   Dx sarcoid   Order- reschedule PFT  Dx asthma mild intermittent  [facility]

## 2015-01-10 NOTE — Progress Notes (Signed)
Patient ID: Bonnie Ellison, female    DOB: 1945/08/07, 69 y.o.   MRN: [id number]  HPI 11/15/10- 69 yo with chronic sinusitis, rhinitis, chronic obstructive asthma, obesity hypoventilation Last here Aug 27, 2010- note reviewed. Acute visit- starting 2-3 days ago has been wheezing and blowing nasal congestion. She had been outdoors at park, then gone where shellfish were being cooked outdoors. She added back Singulair yesterday and will start prednisone taper we called in yesterday afternoon. Today a little better, throat itches, head still stopped up, eyes itch.  02/06/11-  69 yo with chronic sinusitis, rhinitis, chronic obstructive asthma, obesity hypoventilation Acute- Blames weather and in and out of heat for onset of building sinus infection. 1 week- Feels tender left upper jaw and tender to touch cheek, teeth. Nothing blowing out of left nostril. No fever. Has Neti pot- reminded to use it.  Allergy vaccine continues vial A 1:50, vial B 1:50,000 GH  06/28/11-  69 yo with chronic sinusitis, rhinitis, chronic obstructive asthma, obesity hypoventilation Has had flu vaccine. Says ate near the kitchen at a [employer] [place] last night where the fumes made chest tight and made her face itch. She stayed to eat her meal and says the food was not the problem. Leaving the restaurant and stepping out into the very cold air made her tighter. She does not think she has a cold.  08/30/11-65 yoF never smoker with chronic sinusitis, rhinitis, chronic obstructive asthma, obesity hypoventilation She has done well since last here in December. Uses daily Allegra to prevent swelling of left tongue and cheek. Denies wheeze, sinus pain, purulent drainage or hives. She continues allergy vaccine without problems but has never been able to progress beyond 1-50,000.  04/10/12- 65 yoF never smoker with chronic sinusitis, rhinitis, chronic obstructive asthma, obesity hypoventilation Pt states doing well, still on vaccine  1:50,000  GHholding dose.. Denies co's.  She has no reactions at this concentration and volume of allergy vaccine, which is the highest she has tolerated. Had food allergy testing by Dr. Renae GlossShelton and reports allergy(actually elevated antibodies including wheat and almonds. She thinks these might make her lips itch, but is unsure. We discussed the difference between presence of antibodies to foods on blood test versus actual physiologic pattern of symptomatic allergic response. I advised to watch closely those foods she tested positive for and avoid any foods that cause her symptoms. NB- next visit update PFT/ AP/ ? This  10/09/12- 65 yoF never smoker with chronic sinusitis, rhinitis, chronic obstructive asthma, obesity hypoventilation Follows for : pt states doing well , still on vaccine 1:50,000 . Denies any complaints Admits occasional sinus headache/frontal pressure and sometimes some pain in upper teeth. Little nasal discharge. Never able to stay on vaccine stronger than 1:50,000 because of local reactions. No systemic reaction. She feels the vaccine helps her.  01/07/14-  68 yoF never smoker with chronic sinusitis, rhinitis, chronic obstructive asthma, obesity hypoventilation FOLLOWS FOR:  Discuss vertigo.  Allergy Vaccine 1:50,000 GH doing well and allergies under control at this time Went to ER for vertigo in the spring, around May. Had shingles left forearm. Denies wheeze cough or sinusitis.  05/09/14- 68 yoF never smoker with chronic sinusitis, rhinitis, chronic obstructive asthma, obesity hypoventilation, complicated by celiac disease FOLLOW FOR:  Allergies; having to take Allegra and flonase every day, a lot of sinus congestion and soreness in cheeks under eyes  01/10/15- 68 yoF never smoker with chronic sinusitis, rhinitis, chronic obstructive asthma, obesity hypoventilation FOLLOWS FOR:  Pt stated her allergies are doing well and breathing doing well. Pt taking the allergy vaccine and  building up to previous strength and is doing well. Pt stated she recent had her gallbladder removed in April and had a few complications from surgery but is doing well now. Celiac disease diagnosed by GI  Now allergy vaccine 1:50,000, GH trying to rebuild to 1:500. CXR/abd 11/14/14- for acute N&V IMPRESSION: 1. Non obstructed bowel gas pattern with air-fluid levels in the colon which may indicate diarrhea or enema use. 2. No free air. No acute cardiopulmonary abnormality. Electronically Signed  By: Odessa Fleming M.D.  On: 11/14/2014 07:05  Review of Systems-see HPI Constitutional:   No-   weight loss, night sweats, fevers, chills, fatigue, lassitude. HEENT:   + headaches,  No-difficulty swallowing, tooth/dental problems, sore throat,       No-  sneezing, itching, ear ache, +nasal congestion, post nasal drip,  CV:  No-   chest pain, orthopnea, PND, swelling in lower extremities, anasarca, dizziness, palpitations Resp: Increased shortness of breath with exertion or at rest.              No-   productive cough,  No non-productive cough,  No- coughing up of blood.              No-   change in color of mucus.  Little recent wheezing.   Skin: No-   rash or lesions. No hives. GI:  No-   heartburn, indigestion, abdominal pain, nausea, vomiting,  GU:  MS:  No-   joint pain or swelling.   Neuro-     nothing unusual Psych:  No- change in mood or affect. No depression or anxiety.  No memory loss. Objective:   Physical Exam General- Alert, Oriented, Affect-appropriate, Distress- none acute, overweight Skin- rash-none, lesions- none, excoriation- none Lymphadenopathy- none Head- atraumatic            Eyes- Gross vision intact, PERRLA, conjunctivae clear secretions. +Chronic periorbital edema            Ears- Hearing, canals-normal, TMs clear            Nose- +turbinate edema, no-Septal dev, mucus, polyps, erosion, perforation             Throat- Mallampati II , mucosa clear , drainage- none,  tonsils- atrophic, clear w/o swelling. Much dental repair. Neck- flexible , trachea midline, no stridor , thyroid nl, carotid no bruit Chest - symmetrical excursion , unlabored           Heart/CV- RRR , no murmur , no gallop  , no rub, nl s1 s2                           - JVD- none , edema- none, stasis changes- none, varices- none           Lung-  clear , cough- none , dullness-none, rub- none           Chest wall-  Abd- Br/ Gen/ Rectal- Not done, not indicated Extrem- cyanosis- none, clubbing, none, atrophy- none, strength- nl Neuro- grossly intact to observation

## 2015-01-11 LAB — ANGIOTENSIN CONVERTING ENZYME: Angiotensin-Converting Enzyme: 48 U/L (ref 8–52)

## 2015-01-13 ENCOUNTER — Ambulatory Visit (INDEPENDENT_AMBULATORY_CARE_PROVIDER_SITE_OTHER): Payer: Medicare Other

## 2015-01-13 DIAGNOSIS — J309 Allergic rhinitis, unspecified: Secondary | ICD-10-CM | POA: Diagnosis not present

## 2015-01-15 DIAGNOSIS — K900 Celiac disease: Secondary | ICD-10-CM | POA: Insufficient documentation

## 2015-01-15 NOTE — Assessment & Plan Note (Signed)
She reports diagnosis made by GI colonoscopy

## 2015-01-15 NOTE — Assessment & Plan Note (Signed)
Chronic sinus disease. Consider the possibility of sarcoid rhinitis Plan-ACE level

## 2015-01-15 NOTE — Assessment & Plan Note (Signed)
She has felt allergy vaccine helps her, even though she never tolerates much above initial concentration.

## 2015-01-15 NOTE — Assessment & Plan Note (Signed)
Good control. Plan-schedule PFT to update status

## 2015-01-17 ENCOUNTER — Telehealth: Payer: Self-pay | Admitting: Internal Medicine

## 2015-01-17 NOTE — Telephone Encounter (Signed)
Notes Recorded by Waymon Budgelinton D Young, MD on 01/12/2015 at 9:45 PM ACE level is in the normal range, making active sarcoid unlikely CBC shows borderline minimal anemia, but otherwise ok  LMTCB

## 2015-01-18 NOTE — Telephone Encounter (Signed)
Pt cb, please cb at previous number listed °

## 2015-01-18 NOTE — Telephone Encounter (Signed)
lmomtcb x2 on named vm

## 2015-01-18 NOTE — Telephone Encounter (Signed)
Pt returned call [phone]

## 2015-01-18 NOTE — Telephone Encounter (Signed)
Pt is aware of results. 

## 2015-01-18 NOTE — Telephone Encounter (Signed)
Attempted to call pt. She answered then her phone went dead. Will try back later.

## 2015-01-20 ENCOUNTER — Ambulatory Visit (INDEPENDENT_AMBULATORY_CARE_PROVIDER_SITE_OTHER): Payer: Medicare Other

## 2015-01-20 DIAGNOSIS — J309 Allergic rhinitis, unspecified: Secondary | ICD-10-CM | POA: Diagnosis not present

## 2015-01-27 ENCOUNTER — Ambulatory Visit (INDEPENDENT_AMBULATORY_CARE_PROVIDER_SITE_OTHER): Payer: Medicare Other

## 2015-01-27 DIAGNOSIS — J309 Allergic rhinitis, unspecified: Secondary | ICD-10-CM

## 2015-02-03 ENCOUNTER — Ambulatory Visit (INDEPENDENT_AMBULATORY_CARE_PROVIDER_SITE_OTHER): Payer: Medicare Other

## 2015-02-03 DIAGNOSIS — J309 Allergic rhinitis, unspecified: Secondary | ICD-10-CM

## 2015-02-10 ENCOUNTER — Ambulatory Visit (INDEPENDENT_AMBULATORY_CARE_PROVIDER_SITE_OTHER): Payer: Medicare Other

## 2015-02-10 DIAGNOSIS — J309 Allergic rhinitis, unspecified: Secondary | ICD-10-CM

## 2015-02-17 ENCOUNTER — Ambulatory Visit (INDEPENDENT_AMBULATORY_CARE_PROVIDER_SITE_OTHER): Payer: Medicare Other

## 2015-02-17 DIAGNOSIS — J309 Allergic rhinitis, unspecified: Secondary | ICD-10-CM

## 2015-02-24 ENCOUNTER — Ambulatory Visit (INDEPENDENT_AMBULATORY_CARE_PROVIDER_SITE_OTHER): Payer: Medicare Other

## 2015-02-24 DIAGNOSIS — J309 Allergic rhinitis, unspecified: Secondary | ICD-10-CM | POA: Diagnosis not present

## 2015-03-02 ENCOUNTER — Encounter: Payer: Self-pay | Admitting: Internal Medicine

## 2015-03-03 ENCOUNTER — Ambulatory Visit (INDEPENDENT_AMBULATORY_CARE_PROVIDER_SITE_OTHER): Payer: Medicare Other

## 2015-03-03 DIAGNOSIS — J309 Allergic rhinitis, unspecified: Secondary | ICD-10-CM

## 2015-03-09 ENCOUNTER — Encounter: Payer: Self-pay | Admitting: Internal Medicine

## 2015-03-10 ENCOUNTER — Ambulatory Visit (INDEPENDENT_AMBULATORY_CARE_PROVIDER_SITE_OTHER): Payer: Medicare Other

## 2015-03-10 DIAGNOSIS — J309 Allergic rhinitis, unspecified: Secondary | ICD-10-CM | POA: Diagnosis not present

## 2015-03-17 ENCOUNTER — Ambulatory Visit (INDEPENDENT_AMBULATORY_CARE_PROVIDER_SITE_OTHER): Payer: Medicare Other

## 2015-03-17 DIAGNOSIS — J309 Allergic rhinitis, unspecified: Secondary | ICD-10-CM

## 2015-03-24 ENCOUNTER — Ambulatory Visit (INDEPENDENT_AMBULATORY_CARE_PROVIDER_SITE_OTHER): Payer: Medicare Other

## 2015-03-24 DIAGNOSIS — J309 Allergic rhinitis, unspecified: Secondary | ICD-10-CM | POA: Diagnosis not present

## 2015-03-31 ENCOUNTER — Ambulatory Visit (INDEPENDENT_AMBULATORY_CARE_PROVIDER_SITE_OTHER): Payer: Medicare Other

## 2015-03-31 DIAGNOSIS — J309 Allergic rhinitis, unspecified: Secondary | ICD-10-CM | POA: Diagnosis not present

## 2015-04-07 ENCOUNTER — Ambulatory Visit (INDEPENDENT_AMBULATORY_CARE_PROVIDER_SITE_OTHER): Payer: Medicare Other

## 2015-04-07 DIAGNOSIS — J309 Allergic rhinitis, unspecified: Secondary | ICD-10-CM

## 2015-04-10 ENCOUNTER — Other Ambulatory Visit: Payer: Self-pay

## 2015-04-10 DIAGNOSIS — Z1231 Encounter for screening mammogram for malignant neoplasm of breast: Secondary | ICD-10-CM

## 2015-04-10 DIAGNOSIS — Z803 Family history of malignant neoplasm of breast: Secondary | ICD-10-CM

## 2015-04-12 ENCOUNTER — Ambulatory Visit (INDEPENDENT_AMBULATORY_CARE_PROVIDER_SITE_OTHER): Payer: Medicare Other

## 2015-04-12 ENCOUNTER — Telehealth: Payer: Self-pay | Admitting: Internal Medicine

## 2015-04-12 ENCOUNTER — Ambulatory Visit: Payer: Medicare Other

## 2015-04-12 DIAGNOSIS — J309 Allergic rhinitis, unspecified: Secondary | ICD-10-CM | POA: Diagnosis not present

## 2015-04-12 NOTE — Telephone Encounter (Signed)
Allergy Serum Extract Date Mixed: 04/12/15 Vial: 1 pt. Only needed vial B Strength: 1:50000   Here/Mail/Pick Up: here Mixed By: tbs

## 2015-04-14 ENCOUNTER — Ambulatory Visit (INDEPENDENT_AMBULATORY_CARE_PROVIDER_SITE_OTHER): Payer: Medicare Other

## 2015-04-14 DIAGNOSIS — J309 Allergic rhinitis, unspecified: Secondary | ICD-10-CM

## 2015-04-17 ENCOUNTER — Ambulatory Visit
Admission: RE | Admit: 2015-04-17 | Discharge: 2015-04-17 | Disposition: A | Discharge status: Home / Self Care | Payer: Medicare Other | Source: Ambulatory Visit

## 2015-04-17 DIAGNOSIS — Z1231 Encounter for screening mammogram for malignant neoplasm of breast: Secondary | ICD-10-CM

## 2015-04-17 DIAGNOSIS — Z803 Family history of malignant neoplasm of breast: Secondary | ICD-10-CM

## 2015-04-21 ENCOUNTER — Ambulatory Visit (INDEPENDENT_AMBULATORY_CARE_PROVIDER_SITE_OTHER): Payer: Medicare Other

## 2015-04-21 DIAGNOSIS — J309 Allergic rhinitis, unspecified: Secondary | ICD-10-CM | POA: Diagnosis not present

## 2015-04-28 ENCOUNTER — Ambulatory Visit (INDEPENDENT_AMBULATORY_CARE_PROVIDER_SITE_OTHER): Payer: Medicare Other

## 2015-04-28 DIAGNOSIS — J309 Allergic rhinitis, unspecified: Secondary | ICD-10-CM

## 2015-05-01 ENCOUNTER — Ambulatory Visit (INDEPENDENT_AMBULATORY_CARE_PROVIDER_SITE_OTHER): Payer: Medicare Other

## 2015-05-01 DIAGNOSIS — Z23 Encounter for immunization: Secondary | ICD-10-CM | POA: Diagnosis not present

## 2015-05-05 ENCOUNTER — Ambulatory Visit: Payer: Medicare Other

## 2015-05-11 ENCOUNTER — Ambulatory Visit (INDEPENDENT_AMBULATORY_CARE_PROVIDER_SITE_OTHER): Payer: Medicare Other

## 2015-05-11 DIAGNOSIS — J309 Allergic rhinitis, unspecified: Secondary | ICD-10-CM | POA: Diagnosis not present

## 2015-05-15 ENCOUNTER — Ambulatory Visit (INDEPENDENT_AMBULATORY_CARE_PROVIDER_SITE_OTHER): Payer: Medicare Other | Admitting: Internal Medicine

## 2015-05-15 ENCOUNTER — Encounter: Payer: Self-pay | Admitting: Internal Medicine

## 2015-05-15 VITALS — BP 118/64 | HR 67 | Ht 63.0 in | Wt 196.4 lb

## 2015-05-15 DIAGNOSIS — J019 Acute sinusitis, unspecified: Secondary | ICD-10-CM

## 2015-05-15 DIAGNOSIS — E440 Moderate protein-calorie malnutrition: Secondary | ICD-10-CM

## 2015-05-15 DIAGNOSIS — J449 Chronic obstructive pulmonary disease, unspecified: Secondary | ICD-10-CM | POA: Diagnosis not present

## 2015-05-15 MED ORDER — FLUTICASONE PROPIONATE 50 MCG/ACT NA SUSP: 2.0000 | Freq: Two times a day (BID) | NASAL | Status: DC

## 2015-05-15 MED ORDER — METHYLPREDNISOLONE ACETATE 80 MG/ML IJ SUSP
80.0000 mg | Freq: Once | INTRAMUSCULAR | Status: AC
  Administered 2015-05-15: 80 mg via INTRAMUSCULAR

## 2015-05-15 MED ORDER — AMOXICILLIN-POT CLAVULANATE 875-125 MG PO TABS: 1.0000 | ORAL_TABLET | Freq: Two times a day (BID) | ORAL | Status: DC

## 2015-05-15 MED ORDER — PHENYLEPHRINE HCL 1 % NA SOLN
3.0000 [drp] | Freq: Once | NASAL | Status: AC
Start: 2015-05-15 — End: 2015-05-15
  Administered 2015-05-15: 3 [drp] via NASAL

## 2015-05-15 NOTE — Progress Notes (Signed)
Patient ID: Bonnie Ellison, female    DOB: 1945/08/07, 69 y.o.   MRN: [id number]  HPI 11/15/10- 69 yo with chronic sinusitis, rhinitis, chronic obstructive asthma, obesity hypoventilation Last here Aug 27, 2010- note reviewed. Acute visit- starting 2-3 days ago has been wheezing and blowing nasal congestion. She had been outdoors at park, then gone where shellfish were being cooked outdoors. She added back Singulair yesterday and will start prednisone taper we called in yesterday afternoon. Today a little better, throat itches, head still stopped up, eyes itch.  02/06/11-  69 yo with chronic sinusitis, rhinitis, chronic obstructive asthma, obesity hypoventilation Acute- Blames weather and in and out of heat for onset of building sinus infection. 1 week- Feels tender left upper jaw and tender to touch cheek, teeth. Nothing blowing out of left nostril. No fever. Has Neti pot- reminded to use it.  Allergy vaccine continues vial A 1:50, vial B 1:50,000 GH  06/28/11-  69 yo with chronic sinusitis, rhinitis, chronic obstructive asthma, obesity hypoventilation Has had flu vaccine. Says ate near the kitchen at a [employer] [place] last night where the fumes made chest tight and made her face itch. She stayed to eat her meal and says the food was not the problem. Leaving the restaurant and stepping out into the very cold air made her tighter. She does not think she has a cold.  08/30/11-65 yoF never smoker with chronic sinusitis, rhinitis, chronic obstructive asthma, obesity hypoventilation She has done well since last here in December. Uses daily Allegra to prevent swelling of left tongue and cheek. Denies wheeze, sinus pain, purulent drainage or hives. She continues allergy vaccine without problems but has never been able to progress beyond 1-50,000.  04/10/12- 65 yoF never smoker with chronic sinusitis, rhinitis, chronic obstructive asthma, obesity hypoventilation Pt states doing well, still on vaccine  1:50,000  GHholding dose.. Denies co's.  She has no reactions at this concentration and volume of allergy vaccine, which is the highest she has tolerated. Had food allergy testing by Dr. Renae GlossShelton and reports allergy(actually elevated antibodies including wheat and almonds. She thinks these might make her lips itch, but is unsure. We discussed the difference between presence of antibodies to foods on blood test versus actual physiologic pattern of symptomatic allergic response. I advised to watch closely those foods she tested positive for and avoid any foods that cause her symptoms. NB- next visit update PFT/ AP/ ? This  10/09/12- 65 yoF never smoker with chronic sinusitis, rhinitis, chronic obstructive asthma, obesity hypoventilation Follows for : pt states doing well , still on vaccine 1:50,000 . Denies any complaints Admits occasional sinus headache/frontal pressure and sometimes some pain in upper teeth. Little nasal discharge. Never able to stay on vaccine stronger than 1:50,000 because of local reactions. No systemic reaction. She feels the vaccine helps her.  01/07/14-  68 yoF never smoker with chronic sinusitis, rhinitis, chronic obstructive asthma, obesity hypoventilation FOLLOWS FOR:  Discuss vertigo.  Allergy Vaccine 1:50,000 GH doing well and allergies under control at this time Went to ER for vertigo in the spring, around May. Had shingles left forearm. Denies wheeze cough or sinusitis.  05/09/14- 68 yoF never smoker with chronic sinusitis, rhinitis, chronic obstructive asthma, obesity hypoventilation, complicated by celiac disease FOLLOW FOR:  Allergies; having to take Allegra and flonase every day, a lot of sinus congestion and soreness in cheeks under eyes  01/10/15- 68 yoF never smoker with chronic sinusitis, rhinitis, chronic obstructive asthma, obesity hypoventilation FOLLOWS FOR:  Pt stated her allergies are doing well and breathing doing well. Pt taking the allergy vaccine and  building up to previous strength and is doing well. Pt stated she recent had her gallbladder removed in April and had a few complications from surgery but is doing well now. Celiac disease diagnosed by GI  Now allergy vaccine 1:50,000, GH trying to rebuild to 1:500. CXR/abd 11/14/14- for acute N&V IMPRESSION: 1. Non obstructed bowel gas pattern with air-fluid levels in the colon which may indicate diarrhea or enema use. 2. No free air. No acute cardiopulmonary abnormality. Electronically Signed  By: Odessa FlemingH Hall M.D.  On: 11/14/2014 07:05  05/15/15- [age] yoF never smoker with chronic sinusitis, rhinitis, chronic obstructive asthma, obesity hypoventilation, celiac disease  FOLLOWS FOR: still on vaccine; headache that moves to her teeth and neck Allergy vaccine 1:50000 GH Frontal and right maxillary pressure pain 1 month, postnasal drip, right upper teeth hurt. Chest okay. Denies fever or sore throat. Had flu vaccine.  Review of Systems-see HPI Constitutional:   No-   weight loss, night sweats, fevers, chills, fatigue, lassitude. HEENT:   + headaches,  No-difficulty swallowing, tooth/dental problems, sore throat,       No-  sneezing, itching, ear ache, +nasal congestion, post nasal drip,  CV:  No-   chest pain, orthopnea, PND, swelling in lower extremities, anasarca, dizziness, palpitations Resp: Increased shortness of breath with exertion or at rest.              No-   productive cough,  No non-productive cough,  No- coughing up of blood.              No-   change in color of mucus.  Little recent wheezing.   Skin: No-   rash or lesions. No hives. GI:  No-   heartburn, indigestion, abdominal pain, nausea, vomiting,  GU:  MS:  No-   joint pain or swelling.   Neuro-     nothing unusual Psych:  No- change in mood or affect. No depression or anxiety.  No memory loss. Objective:   Physical Exam General- Alert, Oriented, Affect-appropriate, Distress- none acute, overweight Skin- rash-none,  lesions- none, excoriation- none Lymphadenopathy- none Head- atraumatic            Eyes- Gross vision intact, PERRLA, conjunctivae clear secretions. +Chronic periorbital edema            Ears- Hearing, canals-normal, TMs clear            Nose- +turbinate edema, no-Septal dev, mucus, polyps, erosion, perforation             Throat- Mallampati II , mucosa clear , drainage- none, tonsils- atrophic, clear w/o swelling. Much dental repair. Neck- flexible , trachea midline, no stridor , thyroid nl, carotid no bruit Chest - symmetrical excursion , unlabored           Heart/CV- RRR , no murmur , no gallop  , no rub, nl s1 s2                           - JVD- none , edema- none, stasis changes- none, varices- none           Lung-  clear , cough- none , dullness-none, rub- none           Chest wall-  Abd- Br/ Gen/ Rectal- Not done, not indicated Extrem- cyanosis- none, clubbing, none, atrophy- none, strength- nl Neuro- grossly intact  to observation

## 2015-05-15 NOTE — Patient Instructions (Signed)
Neb neo nasal  Depo 80  Script sent for augmentin antibiotic  Script sent refilling fluticasone

## 2015-05-17 ENCOUNTER — Telehealth: Payer: Self-pay | Admitting: Internal Medicine

## 2015-05-17 NOTE — Telephone Encounter (Signed)
Allergy Serum Extract Date Mixed: 05/17/15 Vial: A Strength: 1:50,000 Here/Mail/Pick Up: Here Mixed By:  Jaynee EaglesLindsay Dreden Rivere, CMA

## 2015-05-18 ENCOUNTER — Ambulatory Visit: Payer: Medicare Other

## 2015-05-18 ENCOUNTER — Telehealth: Payer: Self-pay | Admitting: Internal Medicine

## 2015-05-18 ENCOUNTER — Ambulatory Visit (INDEPENDENT_AMBULATORY_CARE_PROVIDER_SITE_OTHER): Payer: Medicare Other

## 2015-05-18 DIAGNOSIS — J309 Allergic rhinitis, unspecified: Secondary | ICD-10-CM | POA: Diagnosis not present

## 2015-05-18 MED ORDER — CEFDINIR 300 MG PO CAPS: 300.0000 mg | ORAL_CAPSULE | Freq: Two times a day (BID) | ORAL | Status: DC

## 2015-05-18 NOTE — Telephone Encounter (Signed)
Spoke with pt. States she was here on 05/15/15 and was given Augmentin. She has developed diarrhea since starting this medication. Would like for this to be changed.  Allergies  Allergen Reactions  . Shellfish Allergy Anaphylaxis    throat swelling, hives  . Wheat Bran Itching   Current Outpatient Prescriptions on File Prior to Visit  Medication Sig Dispense Refill  . acetaminophen (TYLENOL) 325 MG tablet Take 650 mg by mouth every 6 (six) hours as needed for mild pain.     Marland Kitchen. amoxicillin-clavulanate (AUGMENTIN) 875-125 MG tablet Take 1 tablet by mouth 2 (two) times daily. 14 tablet 1  . Cholecalciferol (VITAMIN D) 2000 UNITS tablet Take 2,000 Units by mouth daily.    . fexofenadine (ALLEGRA) 180 MG tablet Take 180 mg by mouth daily.      . fluticasone (FLONASE) 50 MCG/ACT nasal spray Place 2 sprays into both nostrils 2 (two) times daily. 16 g 6  . irbesartan (AVAPRO) 150 MG tablet Take 150 mg by mouth daily.  1  . KLOR-CON M20 20 MEQ tablet Take 1 tablet by mouth 2 (two) times daily.  3  . NONFORMULARY OR COMPOUNDED ITEM Allergy Vaccine A 1:500/B 1:50000 Given at [facility]    . omeprazole (PRILOSEC) 40 MG capsule Take 40 mg by mouth daily as needed (heartburn).     Marland Kitchen. PROAIR HFA 108 (90 BASE) MCG/ACT inhaler USE 2 PUFFS BY MOUTH EVERY 6 HOURS AS NEEDED FOR WHEEZING AND SHORTNESS OF BREATH 8.5 Inhaler 0  . spironolactone (ALDACTONE) 25 MG tablet Take 25 mg by mouth daily.  0   No current facility-administered medications on file prior to visit.   CY- please advise. Thanks.

## 2015-05-18 NOTE — Telephone Encounter (Signed)
Stop aumentin  Offer Rx cefdinir 300 mg, # 10, 1 twice daily  Ok to use kaopectate, pepto-bismol etc for diarrhea if needed

## 2015-05-18 NOTE — Telephone Encounter (Signed)
Called spoke with pt. Aware of below. RX sent into the pharm. Nothing further needed

## 2015-05-25 ENCOUNTER — Ambulatory Visit (INDEPENDENT_AMBULATORY_CARE_PROVIDER_SITE_OTHER): Payer: Medicare Other

## 2015-05-25 DIAGNOSIS — J309 Allergic rhinitis, unspecified: Secondary | ICD-10-CM

## 2015-05-30 NOTE — Assessment & Plan Note (Signed)
Acute exacerbation of chronic frontal and maxillary sinusitis Plan-nasal nebulizer decongestant, Depo-Medrol, Augmentin

## 2015-05-30 NOTE — Assessment & Plan Note (Signed)
Patient remains overweight

## 2015-06-01 ENCOUNTER — Ambulatory Visit (INDEPENDENT_AMBULATORY_CARE_PROVIDER_SITE_OTHER): Payer: Medicare Other

## 2015-06-01 DIAGNOSIS — J309 Allergic rhinitis, unspecified: Secondary | ICD-10-CM | POA: Diagnosis not present

## 2015-06-09 ENCOUNTER — Ambulatory Visit (INDEPENDENT_AMBULATORY_CARE_PROVIDER_SITE_OTHER): Payer: Medicare Other

## 2015-06-09 DIAGNOSIS — J309 Allergic rhinitis, unspecified: Secondary | ICD-10-CM

## 2015-06-15 ENCOUNTER — Ambulatory Visit (INDEPENDENT_AMBULATORY_CARE_PROVIDER_SITE_OTHER): Payer: Medicare Other

## 2015-06-15 DIAGNOSIS — J309 Allergic rhinitis, unspecified: Secondary | ICD-10-CM

## 2015-06-21 ENCOUNTER — Ambulatory Visit: Payer: Medicare Other

## 2015-06-22 ENCOUNTER — Ambulatory Visit: Payer: Medicare Other

## 2015-06-23 ENCOUNTER — Ambulatory Visit (INDEPENDENT_AMBULATORY_CARE_PROVIDER_SITE_OTHER): Payer: Medicare Other

## 2015-06-23 DIAGNOSIS — J309 Allergic rhinitis, unspecified: Secondary | ICD-10-CM | POA: Diagnosis not present

## 2015-06-29 ENCOUNTER — Ambulatory Visit (INDEPENDENT_AMBULATORY_CARE_PROVIDER_SITE_OTHER): Payer: Medicare Other

## 2015-06-29 DIAGNOSIS — J309 Allergic rhinitis, unspecified: Secondary | ICD-10-CM

## 2015-07-06 ENCOUNTER — Ambulatory Visit (INDEPENDENT_AMBULATORY_CARE_PROVIDER_SITE_OTHER): Payer: Medicare Other

## 2015-07-06 DIAGNOSIS — J309 Allergic rhinitis, unspecified: Secondary | ICD-10-CM | POA: Diagnosis not present

## 2015-07-13 ENCOUNTER — Encounter: Payer: Self-pay | Admitting: Gynecology

## 2015-07-13 ENCOUNTER — Ambulatory Visit (INDEPENDENT_AMBULATORY_CARE_PROVIDER_SITE_OTHER): Payer: Medicare Other

## 2015-07-13 ENCOUNTER — Ambulatory Visit (INDEPENDENT_AMBULATORY_CARE_PROVIDER_SITE_OTHER): Payer: Medicare Other | Admitting: Gynecology

## 2015-07-13 VITALS — BP 134/86 | Ht 62.0 in | Wt 200.0 lb

## 2015-07-13 DIAGNOSIS — N951 Menopausal and female climacteric states: Secondary | ICD-10-CM

## 2015-07-13 DIAGNOSIS — N952 Postmenopausal atrophic vaginitis: Secondary | ICD-10-CM

## 2015-07-13 DIAGNOSIS — Z01419 Encounter for gynecological examination (general) (routine) without abnormal findings: Secondary | ICD-10-CM | POA: Diagnosis not present

## 2015-07-13 DIAGNOSIS — J309 Allergic rhinitis, unspecified: Secondary | ICD-10-CM | POA: Diagnosis not present

## 2015-07-13 NOTE — Progress Notes (Signed)
Bonnie Ellison 15-Feb-1946 [id number]        69 y.o.  [id number]  for Breast and pelvic exam. Several issues noted below.  Past medical history,surgical history, problem list, medications, allergies, family history and social history were all reviewed and documented as reviewed in the EPIC chart.  ROS:  Performed with pertinent positives and negatives included in the history, assessment and plan.   Additional significant findings :  none   Exam: [name] assistant Filed Vitals:   07/13/15 0910  BP: 134/86  Height: 5\' 2"  (1.575 m)  Weight: 200 lb (90.719 kg)   General appearance:  Normal affect, orientation and appearance. Skin: Grossly normal HEENT: Without gross lesions.  No cervical or supraclavicular adenopathy. Thyroid normal.  Lungs:  Clear without wheezing, rales or rhonchi Cardiac: RR, without RMG Abdominal:  Soft, nontender, without masses, guarding, rebound, organomegaly or hernia Breasts:  Examined lying and sitting without masses, retractions, discharge or axillary adenopathy. Pelvic:  Ext/BUS/vagina with atrophic changes  Adnexa  Without masses or tenderness    Anus and perineum  Normal   Rectovaginal  Normal sphincter tone without palpated masses or tenderness.    Assessment/Plan:  69 y.o. [id number] female for breast and pelvic exam.   1. Postmenopausal/atrophic genital changes/menopausal symptoms. Patient had been on estradiol 0.5 mg but discontinued earlier this year. Is having some hot flushes and sweats but overall tolerable. Discussed possible trial of OTC soy based products. Does not want to start back on ERT. We'll continue to monitor at this point in follow up if any issues.  Otherwise doing well without significant vaginal dryness. Status post TAH/BSO in the past for leiomyoma 2. Pap smear 2011. No Pap smear done today. No history of abnormal Pap smears. We've previously discussed current screening guidelines and she is comfortable stop screening based on age and  hysterectomy history. 3. Colonoscopy 2016. Repeat at their recommended interval. 4. DEXA over 5 years ago. Recommend baseline DEXA now she agrees to schedule. 5. Mammography 2016. Continue with annual mammography when due. SBE monthly reviewed. 6. Health maintenance. No routine blood work done as this is done at her primary physician's office. Follow up in one year, sooner as needed.   Dara LordsFONTAINE,Yago Ludvigsen P MD, 9:42 AM 07/13/2015

## 2015-07-13 NOTE — Patient Instructions (Addendum)
Try Cherene Julian which is the over the counter soy product.  Follow up for the bone density as scheduled.  You may obtain a copy of any labs that were done today by logging onto MyChart as outlined in the instructions provided with your AVS (after visit summary). The office will not call with normal lab results but certainly if there are any significant abnormalities then we will contact you.   Health Maintenance Adopting a healthy lifestyle and getting preventive care can go a long way to promote health and wellness. Talk with your health care provider about what schedule of regular examinations is right for you. This is a good chance for you to check in with your provider about disease prevention and staying healthy. In between checkups, there are plenty of things you can do on your own. Experts have done a lot of research about which lifestyle changes and preventive measures are most likely to keep you healthy. Ask your health care provider for more information. WEIGHT AND DIET  Eat a healthy diet  Be sure to include plenty of vegetables, fruits, low-fat dairy products, and lean protein.  Do not eat a lot of foods high in solid fats, added sugars, or salt.  Get regular exercise. This is one of the most important things you can do for your health.  Most adults should exercise for at least 150 minutes each week. The exercise should increase your heart rate and make you sweat (moderate-intensity exercise).  Most adults should also do strengthening exercises at least twice a week. This is in addition to the moderate-intensity exercise.  Maintain a healthy weight  Body mass index (BMI) is a measurement that can be used to identify possible weight problems. It estimates body fat based on height and weight. Your health care provider can help determine your BMI and help you achieve or maintain a healthy weight.  For females 16 years of age and older:   A BMI below 18.5 is considered  underweight.  A BMI of 18.5 to 24.9 is normal.  A BMI of 25 to 29.9 is considered overweight.  A BMI of 30 and above is considered obese.  Watch levels of cholesterol and blood lipids  You should start having your blood tested for lipids and cholesterol at 68 years of age, then have this test every 5 years.  You may need to have your cholesterol levels checked more often if:  Your lipid or cholesterol levels are high.  You are older than 68 years of age.  You are at high risk for heart disease.  CANCER SCREENING   Lung Cancer  Lung cancer screening is recommended for adults 80-72 years old who are at high risk for lung cancer because of a history of smoking.  A yearly low-dose CT scan of the lungs is recommended for people who:  Currently smoke.  Have quit within the past 15 years.  Have at least a 30-pack-year history of smoking. A pack year is smoking an average of one pack of cigarettes a day for 1 year.  Yearly screening should continue until it has been 15 years since you quit.  Yearly screening should stop if you develop a health problem that would prevent you from having lung cancer treatment.  Breast Cancer  Practice breast self-awareness. This means understanding how your breasts normally appear and feel.  It also means doing regular breast self-exams. Let your health care provider know about any changes, no matter how small.  If you are  in your 92s or 30s, you should have a clinical breast exam (CBE) by a health care provider every 1-3 years as part of a regular health exam.  If you are 9 or older, have a CBE every year. Also consider having a breast X-ray (mammogram) every year.  If you have a family history of breast cancer, talk to your health care provider about genetic screening.  If you are at high risk for breast cancer, talk to your health care provider about having an MRI and a mammogram every year.  Breast cancer gene (BRCA) assessment is  recommended for women who have family members with BRCA-related cancers. BRCA-related cancers include:  Breast.  Ovarian.  Tubal.  Peritoneal cancers.  Results of the assessment will determine the need for genetic counseling and BRCA1 and BRCA2 testing. Cervical Cancer Routine pelvic examinations to screen for cervical cancer are no longer recommended for nonpregnant women who are considered low risk for cancer of the pelvic organs (ovaries, uterus, and vagina) and who do not have symptoms. A pelvic examination may be necessary if you have symptoms including those associated with pelvic infections. Ask your health care provider if a screening pelvic exam is right for you.   The Pap test is the screening test for cervical cancer for women who are considered at risk.  If you had a hysterectomy for a problem that was not cancer or a condition that could lead to cancer, then you no longer need Pap tests.  If you are older than 65 years, and you have had normal Pap tests for the past 10 years, you no longer need to have Pap tests.  If you have had past treatment for cervical cancer or a condition that could lead to cancer, you need Pap tests and screening for cancer for at least 20 years after your treatment.  If you no longer get a Pap test, assess your risk factors if they change (such as having a new sexual partner). This can affect whether you should start being screened again.  Some women have medical problems that increase their chance of getting cervical cancer. If this is the case for you, your health care provider may recommend more frequent screening and Pap tests.  The human papillomavirus (HPV) test is another test that may be used for cervical cancer screening. The HPV test looks for the virus that can cause cell changes in the cervix. The cells collected during the Pap test can be tested for HPV.  The HPV test can be used to screen women 67 years of age and older. Getting tested  for HPV can extend the interval between normal Pap tests from three to five years.  An HPV test also should be used to screen women of any age who have unclear Pap test results.  After 68 years of age, women should have HPV testing as often as Pap tests.  Colorectal Cancer  This type of cancer can be detected and often prevented.  Routine colorectal cancer screening usually begins at 68 years of age and continues through 68 years of age.  Your health care provider may recommend screening at an earlier age if you have risk factors for colon cancer.  Your health care provider may also recommend using home test kits to check for hidden blood in the stool.  A small camera at the end of a tube can be used to examine your colon directly (sigmoidoscopy or colonoscopy). This is done to check for the earliest forms  of colorectal cancer.  Routine screening usually begins at age 50.  Direct examination of the colon should be repeated every 5-10 years through 68 years of age. However, you may need to be screened more often if early forms of precancerous polyps or small growths are found. Skin Cancer  Check your skin from head to toe regularly.  Tell your health care provider about any new moles or changes in moles, especially if there is a change in a mole's shape or color.  Also tell your health care provider if you have a mole that is larger than the size of a pencil eraser.  Always use sunscreen. Apply sunscreen liberally and repeatedly throughout the day.  Protect yourself by wearing long sleeves, pants, a wide-brimmed hat, and sunglasses whenever you are outside. HEART DISEASE, DIABETES, AND HIGH BLOOD PRESSURE   Have your blood pressure checked at least every 1-2 years. High blood pressure causes heart disease and increases the risk of stroke.  If you are between 55 years and 79 years old, ask your health care provider if you should take aspirin to prevent strokes.  Have regular  diabetes screenings. This involves taking a blood sample to check your fasting blood sugar level.  If you are at a normal weight and have a low risk for diabetes, have this test once every three years after 68 years of age.  If you are overweight and have a high risk for diabetes, consider being tested at a younger age or more often. PREVENTING INFECTION  Hepatitis B  If you have a higher risk for hepatitis B, you should be screened for this virus. You are considered at high risk for hepatitis B if:  You were born in a country where hepatitis B is common. Ask your health care provider which countries are considered high risk.  Your parents were born in a high-risk country, and you have not been immunized against hepatitis B (hepatitis B vaccine).  You have HIV or AIDS.  You use needles to inject street drugs.  You live with someone who has hepatitis B.  You have had sex with someone who has hepatitis B.  You get hemodialysis treatment.  You take certain medicines for conditions, including cancer, organ transplantation, and autoimmune conditions. Hepatitis C  Blood testing is recommended for:  Everyone born from 1945 through 1965.  Anyone with known risk factors for hepatitis C. Sexually transmitted infections (STIs)  You should be screened for sexually transmitted infections (STIs) including gonorrhea and chlamydia if:  You are sexually active and are younger than 68 years of age.  You are older than 68 years of age and your health care provider tells you that you are at risk for this type of infection.  Your sexual activity has changed since you were last screened and you are at an increased risk for chlamydia or gonorrhea. Ask your health care provider if you are at risk.  If you do not have HIV, but are at risk, it may be recommended that you take a prescription medicine daily to prevent HIV infection. This is called pre-exposure prophylaxis (PrEP). You are considered at  risk if:  You are sexually active and do not regularly use condoms or know the HIV status of your partner(s).  You take drugs by injection.  You are sexually active with a partner who has HIV. Talk with your health care provider about whether you are at high risk of being infected with HIV. If you choose to begin   PrEP, you should first be tested for HIV. You should then be tested every 3 months for as long as you are taking PrEP.  PREGNANCY   If you are premenopausal and you may become pregnant, ask your health care provider about preconception counseling.  If you may become pregnant, take 400 to 800 micrograms (mcg) of folic acid every day.  If you want to prevent pregnancy, talk to your health care provider about birth control (contraception). OSTEOPOROSIS AND MENOPAUSE   Osteoporosis is a disease in which the bones lose minerals and strength with aging. This can result in serious bone fractures. Your risk for osteoporosis can be identified using a bone density scan.  If you are 30 years of age or older, or if you are at risk for osteoporosis and fractures, ask your health care provider if you should be screened.  Ask your health care provider whether you should take a calcium or vitamin D supplement to lower your risk for osteoporosis.  Menopause may have certain physical symptoms and risks.  Hormone replacement therapy may reduce some of these symptoms and risks. Talk to your health care provider about whether hormone replacement therapy is right for you.  HOME CARE INSTRUCTIONS   Schedule regular health, dental, and eye exams.  Stay current with your immunizations.   Do not use any tobacco products including cigarettes, chewing tobacco, or electronic cigarettes.  If you are pregnant, do not drink alcohol.  If you are breastfeeding, limit how much and how often you drink alcohol.  Limit alcohol intake to no more than 1 drink per day for nonpregnant women. One drink equals  12 ounces of beer, 5 ounces of wine, or 1 ounces of hard liquor.  Do not use street drugs.  Do not share needles.  Ask your health care provider for help if you need support or information about quitting drugs.  Tell your health care provider if you often feel depressed.  Tell your health care provider if you have ever been abused or do not feel safe at home. Document Released: 01/14/2011 Document Revised: 11/15/2013 Document Reviewed: 06/02/2013 [facility] Patient Information 2015 [place], [place]. This information is not intended to replace advice given to you by your health care provider. Make sure you discuss any questions you have with your health care provider.

## 2015-07-14 LAB — URINALYSIS W MICROSCOPIC + REFLEX CULTURE
Bacteria, UA: NONE SEEN [HPF]
Bilirubin Urine: NEGATIVE
Casts: NONE SEEN [LPF]
Crystals: NONE SEEN [HPF]
Glucose, UA: NEGATIVE
Hgb urine dipstick: NEGATIVE
Ketones, ur: NEGATIVE
Nitrite: NEGATIVE
Protein, ur: NEGATIVE
RBC / HPF: NONE SEEN RBC/HPF (ref <=2)
Specific Gravity, Urine: 1.01 (ref 1.001–1.035)
Squamous Epithelial / LPF: NONE SEEN [HPF] (ref <=5)
WBC, UA: NONE SEEN WBC/HPF (ref <=5)
Yeast: NONE SEEN [HPF]
pH: 6.5 (ref 5.0–8.0)

## 2015-07-15 LAB — URINE CULTURE
Colony Count: NO GROWTH
Organism ID, Bacteria: NO GROWTH

## 2015-07-16 DIAGNOSIS — M8580 Other specified disorders of bone density and structure, unspecified site: Secondary | ICD-10-CM

## 2015-07-16 HISTORY — DX: Other specified disorders of bone density and structure, unspecified site: M85.80

## 2015-07-20 ENCOUNTER — Ambulatory Visit: Payer: Medicare Other

## 2015-07-20 ENCOUNTER — Ambulatory Visit (INDEPENDENT_AMBULATORY_CARE_PROVIDER_SITE_OTHER): Payer: Medicare Other

## 2015-07-20 DIAGNOSIS — J309 Allergic rhinitis, unspecified: Secondary | ICD-10-CM

## 2015-07-21 ENCOUNTER — Telehealth: Payer: Self-pay | Admitting: Internal Medicine

## 2015-07-21 DIAGNOSIS — J309 Allergic rhinitis, unspecified: Secondary | ICD-10-CM | POA: Diagnosis not present

## 2015-07-21 NOTE — Telephone Encounter (Signed)
Allergy Serum Extract Date Mixed: 07/21/15 Vial: 2 Strength: 1:50,000 Here/Mail/Pick Up: here Mixed By: tbs Last OV: 05/15/15 Pending OV: 11/13/15

## 2015-07-27 ENCOUNTER — Ambulatory Visit (INDEPENDENT_AMBULATORY_CARE_PROVIDER_SITE_OTHER): Payer: Medicare Other

## 2015-07-27 DIAGNOSIS — J309 Allergic rhinitis, unspecified: Secondary | ICD-10-CM

## 2015-07-28 ENCOUNTER — Telehealth: Payer: Self-pay | Admitting: *Deleted

## 2015-07-28 NOTE — Telephone Encounter (Signed)
Pt scheduled for Dexa on 08/01/15 takes potassium daily asked if okay to take pill that am, pt informed this is okay.

## 2015-08-01 ENCOUNTER — Encounter: Payer: Self-pay | Admitting: Gynecology

## 2015-08-01 ENCOUNTER — Ambulatory Visit (INDEPENDENT_AMBULATORY_CARE_PROVIDER_SITE_OTHER): Payer: Medicare Other

## 2015-08-01 ENCOUNTER — Other Ambulatory Visit: Payer: Self-pay | Admitting: Gynecology

## 2015-08-01 DIAGNOSIS — Z01419 Encounter for gynecological examination (general) (routine) without abnormal findings: Secondary | ICD-10-CM

## 2015-08-01 DIAGNOSIS — M8589 Other specified disorders of bone density and structure, multiple sites: Secondary | ICD-10-CM

## 2015-08-01 DIAGNOSIS — M899 Disorder of bone, unspecified: Secondary | ICD-10-CM | POA: Diagnosis not present

## 2015-08-01 DIAGNOSIS — M8580 Other specified disorders of bone density and structure, unspecified site: Secondary | ICD-10-CM

## 2015-08-02 ENCOUNTER — Other Ambulatory Visit: Payer: Self-pay | Admitting: Gynecology

## 2015-08-02 DIAGNOSIS — M8580 Other specified disorders of bone density and structure, unspecified site: Secondary | ICD-10-CM

## 2015-08-02 DIAGNOSIS — M8589 Other specified disorders of bone density and structure, multiple sites: Secondary | ICD-10-CM

## 2015-08-03 ENCOUNTER — Ambulatory Visit (INDEPENDENT_AMBULATORY_CARE_PROVIDER_SITE_OTHER): Payer: Medicare Other

## 2015-08-03 DIAGNOSIS — J309 Allergic rhinitis, unspecified: Secondary | ICD-10-CM

## 2015-08-10 ENCOUNTER — Ambulatory Visit (INDEPENDENT_AMBULATORY_CARE_PROVIDER_SITE_OTHER): Payer: 59

## 2015-08-10 DIAGNOSIS — J309 Allergic rhinitis, unspecified: Secondary | ICD-10-CM

## 2015-08-18 ENCOUNTER — Ambulatory Visit (INDEPENDENT_AMBULATORY_CARE_PROVIDER_SITE_OTHER): Payer: 59

## 2015-08-18 DIAGNOSIS — J309 Allergic rhinitis, unspecified: Secondary | ICD-10-CM

## 2015-08-25 ENCOUNTER — Ambulatory Visit (INDEPENDENT_AMBULATORY_CARE_PROVIDER_SITE_OTHER): Payer: 59

## 2015-08-25 DIAGNOSIS — J309 Allergic rhinitis, unspecified: Secondary | ICD-10-CM | POA: Diagnosis not present

## 2015-09-01 ENCOUNTER — Ambulatory Visit (INDEPENDENT_AMBULATORY_CARE_PROVIDER_SITE_OTHER): Payer: 59

## 2015-09-01 DIAGNOSIS — J309 Allergic rhinitis, unspecified: Secondary | ICD-10-CM | POA: Diagnosis not present

## 2015-09-06 ENCOUNTER — Encounter: Payer: Self-pay | Admitting: Internal Medicine

## 2015-09-08 ENCOUNTER — Ambulatory Visit: Payer: 59

## 2015-09-13 ENCOUNTER — Ambulatory Visit (INDEPENDENT_AMBULATORY_CARE_PROVIDER_SITE_OTHER): Payer: 59 | Admitting: *Deleted

## 2015-09-13 DIAGNOSIS — J309 Allergic rhinitis, unspecified: Secondary | ICD-10-CM | POA: Diagnosis not present

## 2015-09-14 ENCOUNTER — Ambulatory Visit: Payer: 59

## 2015-09-22 ENCOUNTER — Ambulatory Visit (INDEPENDENT_AMBULATORY_CARE_PROVIDER_SITE_OTHER): Payer: 59 | Admitting: *Deleted

## 2015-09-22 DIAGNOSIS — J309 Allergic rhinitis, unspecified: Secondary | ICD-10-CM | POA: Diagnosis not present

## 2015-09-28 ENCOUNTER — Ambulatory Visit: Payer: Medicare Other

## 2015-09-29 ENCOUNTER — Ambulatory Visit (INDEPENDENT_AMBULATORY_CARE_PROVIDER_SITE_OTHER): Payer: Medicare Other

## 2015-09-29 DIAGNOSIS — J309 Allergic rhinitis, unspecified: Secondary | ICD-10-CM | POA: Diagnosis not present

## 2015-10-05 ENCOUNTER — Ambulatory Visit (INDEPENDENT_AMBULATORY_CARE_PROVIDER_SITE_OTHER): Payer: Medicare Other | Admitting: *Deleted

## 2015-10-05 DIAGNOSIS — J309 Allergic rhinitis, unspecified: Secondary | ICD-10-CM

## 2015-10-12 ENCOUNTER — Ambulatory Visit (INDEPENDENT_AMBULATORY_CARE_PROVIDER_SITE_OTHER): Payer: Medicare Other | Admitting: *Deleted

## 2015-10-12 DIAGNOSIS — J309 Allergic rhinitis, unspecified: Secondary | ICD-10-CM

## 2015-10-20 ENCOUNTER — Ambulatory Visit (INDEPENDENT_AMBULATORY_CARE_PROVIDER_SITE_OTHER): Payer: Medicare Other | Admitting: *Deleted

## 2015-10-20 DIAGNOSIS — J309 Allergic rhinitis, unspecified: Secondary | ICD-10-CM

## 2015-10-26 ENCOUNTER — Ambulatory Visit (INDEPENDENT_AMBULATORY_CARE_PROVIDER_SITE_OTHER): Payer: Medicare Other

## 2015-10-26 DIAGNOSIS — J309 Allergic rhinitis, unspecified: Secondary | ICD-10-CM | POA: Diagnosis not present

## 2015-10-27 ENCOUNTER — Ambulatory Visit: Payer: Medicare Other

## 2015-11-03 ENCOUNTER — Ambulatory Visit (INDEPENDENT_AMBULATORY_CARE_PROVIDER_SITE_OTHER): Payer: Medicare Other | Admitting: *Deleted

## 2015-11-03 DIAGNOSIS — J309 Allergic rhinitis, unspecified: Secondary | ICD-10-CM | POA: Diagnosis not present

## 2015-11-09 ENCOUNTER — Ambulatory Visit (INDEPENDENT_AMBULATORY_CARE_PROVIDER_SITE_OTHER): Payer: Medicare Other | Admitting: *Deleted

## 2015-11-09 DIAGNOSIS — J309 Allergic rhinitis, unspecified: Secondary | ICD-10-CM | POA: Diagnosis not present

## 2015-11-13 ENCOUNTER — Ambulatory Visit: Payer: Medicare Other | Admitting: Internal Medicine

## 2015-11-17 ENCOUNTER — Ambulatory Visit (INDEPENDENT_AMBULATORY_CARE_PROVIDER_SITE_OTHER): Payer: Medicare Other

## 2015-11-17 DIAGNOSIS — J309 Allergic rhinitis, unspecified: Secondary | ICD-10-CM | POA: Diagnosis not present

## 2015-11-24 ENCOUNTER — Ambulatory Visit (INDEPENDENT_AMBULATORY_CARE_PROVIDER_SITE_OTHER): Payer: Medicare Other | Admitting: *Deleted

## 2015-11-24 DIAGNOSIS — J309 Allergic rhinitis, unspecified: Secondary | ICD-10-CM

## 2015-11-30 ENCOUNTER — Ambulatory Visit (INDEPENDENT_AMBULATORY_CARE_PROVIDER_SITE_OTHER): Payer: Medicare Other

## 2015-11-30 ENCOUNTER — Telehealth: Payer: Self-pay | Admitting: Internal Medicine

## 2015-11-30 ENCOUNTER — Ambulatory Visit (INDEPENDENT_AMBULATORY_CARE_PROVIDER_SITE_OTHER): Payer: Medicare Other | Admitting: Internal Medicine

## 2015-11-30 ENCOUNTER — Encounter: Payer: Self-pay | Admitting: Internal Medicine

## 2015-11-30 VITALS — BP 122/78 | HR 75 | Ht 63.0 in | Wt 212.0 lb

## 2015-11-30 DIAGNOSIS — J45998 Other asthma: Secondary | ICD-10-CM

## 2015-11-30 DIAGNOSIS — J309 Allergic rhinitis, unspecified: Secondary | ICD-10-CM

## 2015-11-30 NOTE — Progress Notes (Signed)
Patient ID: Bonnie Ellison, female    DOB: 1945/08/07, 70 y.o.   MRN: [id number]  HPI 11/15/10- 70 yo with chronic sinusitis, rhinitis, chronic obstructive asthma, obesity hypoventilation Last here Aug 27, 2010- note reviewed. Acute visit- starting 2-3 days ago has been wheezing and blowing nasal congestion. She had been outdoors at park, then gone where shellfish were being cooked outdoors. She added back Singulair yesterday and will start prednisone taper we called in yesterday afternoon. Today a little better, throat itches, head still stopped up, eyes itch.  02/06/11-  70 yo with chronic sinusitis, rhinitis, chronic obstructive asthma, obesity hypoventilation Acute- Blames weather and in and out of heat for onset of building sinus infection. 1 week- Feels tender left upper jaw and tender to touch cheek, teeth. Nothing blowing out of left nostril. No fever. Has Neti pot- reminded to use it.  Allergy vaccine continues vial A 1:50, vial B 1:50,000 GH  06/28/11-  70 yo with chronic sinusitis, rhinitis, chronic obstructive asthma, obesity hypoventilation Has had flu vaccine. Says ate near the kitchen at a [employer] restaurant last night where the fumes made chest tight and made her face itch. She stayed to eat her meal and says the food was not the problem. Leaving the restaurant and stepping out into the very cold air made her tighter. She does not think she has a cold.  08/30/11-65 yoF never smoker with chronic sinusitis, rhinitis, chronic obstructive asthma, obesity hypoventilation She has done well since last here in December. Uses daily Allegra to prevent swelling of left tongue and cheek. Denies wheeze, sinus pain, purulent drainage or hives. She continues allergy vaccine without problems but has never been able to progress beyond 1-50,000.  04/10/12- 65 yoF never smoker with chronic sinusitis, rhinitis, chronic obstructive asthma, obesity hypoventilation Pt states doing well, still on vaccine  1:50,000  GHholding dose.. Denies co's.  She has no reactions at this concentration and volume of allergy vaccine, which is the highest she has tolerated. Had food allergy testing by Dr. Renae GlossShelton and reports allergy(actually elevated antibodies including wheat and almonds. She thinks these might make her lips itch, but is unsure. We discussed the difference between presence of antibodies to foods on blood test versus actual physiologic pattern of symptomatic allergic response. I advised to watch closely those foods she tested positive for and avoid any foods that cause her symptoms. NB- next visit update PFT/ AP/ ? This  10/09/12- 65 yoF never smoker with chronic sinusitis, rhinitis, chronic obstructive asthma, obesity hypoventilation Follows for : pt states doing well , still on vaccine 1:50,000 . Denies any complaints Admits occasional sinus headache/frontal pressure and sometimes some pain in upper teeth. Little nasal discharge. Never able to stay on vaccine stronger than 1:50,000 because of local reactions. No systemic reaction. She feels the vaccine helps her.  01/07/14-  68 yoF never smoker with chronic sinusitis, rhinitis, chronic obstructive asthma, obesity hypoventilation FOLLOWS FOR:  Discuss vertigo.  Allergy Vaccine 1:50,000 GH doing well and allergies under control at this time Went to ER for vertigo in the spring, around May. Had shingles left forearm. Denies wheeze cough or sinusitis.  05/09/14- 68 yoF never smoker with chronic sinusitis, rhinitis, chronic obstructive asthma, obesity hypoventilation, complicated by celiac disease FOLLOW FOR:  Allergies; having to take Allegra and flonase every day, a lot of sinus congestion and soreness in cheeks under eyes  01/10/15- 68 yoF never smoker with chronic sinusitis, rhinitis, chronic obstructive asthma, obesity hypoventilation FOLLOWS FOR:  Pt stated her allergies are doing well and breathing doing well. Pt taking the allergy vaccine and  building up to previous strength and is doing well. Pt stated she recent had her gallbladder removed in April and had a few complications from surgery but is doing well now. Celiac disease diagnosed by GI  Now allergy vaccine 1:50,000, GH trying to rebuild to 1:500. CXR/abd 11/14/14- for acute N&V IMPRESSION: 1. Non obstructed bowel gas pattern with air-fluid levels in the colon which may indicate diarrhea or enema use. 2. No free air. No acute cardiopulmonary abnormality. Electronically Signed  By: Odessa Fleming M.D.  On: 11/14/2014 07:05  05/15/15- 28 yoF never smoker with chronic sinusitis, rhinitis, chronic obstructive asthma, obesity hypoventilation, celiac disease  FOLLOWS FOR: still on vaccine; headache that moves to her teeth and neck Allergy vaccine 1:50000 GH Frontal and right maxillary pressure pain 1 month, postnasal drip, right upper teeth hurt. Chest okay. Denies fever or sore throat. Had flu vaccine.  11/30/2015-70 year old female never smoker followed for chronic sinusitis, rhinitis, chronic obstructive asthma, obesity/hypoventilation complicated by celiac disease Allergy vaccine 1:50000 GH highest tolerated FOLLOWS FOR: Still on allergy vaccine and no reactions. Continues using Allegra OTC when needed.   Review of Systems-see HPI Constitutional:   No-   weight loss, night sweats, fevers, chills, fatigue, lassitude. HEENT:   + headaches,  No-difficulty swallowing, tooth/dental problems, sore throat,       No-  sneezing, itching, ear ache, +nasal congestion, post nasal drip,  CV:  No-   chest pain, orthopnea, PND, swelling in lower extremities, anasarca, dizziness, palpitations Resp: Increased shortness of breath with exertion or at rest.              No-   productive cough,  No non-productive cough,  No- coughing up of blood.              No-   change in color of mucus.  Little recent wheezing.   Skin: No-   rash or lesions. No hives. GI:  No-   heartburn, indigestion,  abdominal pain, nausea, vomiting,  GU:  MS:  No-   joint pain or swelling.   Neuro-     nothing unusual Psych:  No- change in mood or affect. No depression or anxiety.  No memory loss. Objective:   Physical Exam General- Alert, Oriented, Affect-appropriate, Distress- none acute, overweight Skin- rash-none, lesions- none, excoriation- none Lymphadenopathy- none Head- atraumatic            Eyes- Gross vision intact, PERRLA, conjunctivae clear secretions. +Chronic periorbital edema            Ears- Hearing, canals-normal, TMs clear            Nose- +turbinate edema, no-Septal dev, mucus, polyps, erosion, perforation             Throat- Mallampati II , mucosa clear , drainage- none, tonsils- atrophic, clear w/o swelling. Much dental repair. Neck- flexible , trachea midline, no stridor , thyroid nl, carotid no bruit Chest - symmetrical excursion , unlabored           Heart/CV- RRR , no murmur , no gallop  , no rub, nl s1 s2                           - JVD- none , edema- none, stasis changes- none, varices- none           Lung-  clear ,  cough- none , dullness-none, rub- none           Chest wall-  Abd- Br/ Gen/ Rectal- Not done, not indicated Extrem- cyanosis- none, clubbing, none, atrophy- none, strength- nl Neuro- grossly intact to observation

## 2015-11-30 NOTE — Telephone Encounter (Signed)
Allergy Serum Extract Date Mixed: 11/30/15 Vial: 2 Strength: 1:50,000 Here/Mail/Pick Up: here Mixed By: tbs Last OV: 05/15/15 Pending OV: 11/30/15

## 2015-11-30 NOTE — Patient Instructions (Signed)
We can continue allergy shots through this year  Please call for medicine refills as needed

## 2015-12-01 ENCOUNTER — Ambulatory Visit: Payer: Medicare Other

## 2015-12-08 ENCOUNTER — Ambulatory Visit (INDEPENDENT_AMBULATORY_CARE_PROVIDER_SITE_OTHER): Payer: Medicare Other

## 2015-12-08 DIAGNOSIS — J309 Allergic rhinitis, unspecified: Secondary | ICD-10-CM

## 2015-12-15 ENCOUNTER — Ambulatory Visit (INDEPENDENT_AMBULATORY_CARE_PROVIDER_SITE_OTHER): Payer: Medicare Other | Admitting: *Deleted

## 2015-12-15 DIAGNOSIS — J309 Allergic rhinitis, unspecified: Secondary | ICD-10-CM

## 2015-12-22 ENCOUNTER — Ambulatory Visit (INDEPENDENT_AMBULATORY_CARE_PROVIDER_SITE_OTHER): Payer: Medicare Other | Admitting: *Deleted

## 2015-12-22 DIAGNOSIS — J309 Allergic rhinitis, unspecified: Secondary | ICD-10-CM

## 2015-12-29 ENCOUNTER — Ambulatory Visit (INDEPENDENT_AMBULATORY_CARE_PROVIDER_SITE_OTHER): Payer: Medicare Other

## 2015-12-29 DIAGNOSIS — J309 Allergic rhinitis, unspecified: Secondary | ICD-10-CM

## 2016-01-05 ENCOUNTER — Ambulatory Visit (INDEPENDENT_AMBULATORY_CARE_PROVIDER_SITE_OTHER): Payer: Medicare Other | Admitting: *Deleted

## 2016-01-05 DIAGNOSIS — J309 Allergic rhinitis, unspecified: Secondary | ICD-10-CM

## 2016-01-12 ENCOUNTER — Ambulatory Visit (INDEPENDENT_AMBULATORY_CARE_PROVIDER_SITE_OTHER): Payer: Medicare Other | Admitting: *Deleted

## 2016-01-12 DIAGNOSIS — J309 Allergic rhinitis, unspecified: Secondary | ICD-10-CM | POA: Diagnosis not present

## 2016-01-19 ENCOUNTER — Ambulatory Visit (INDEPENDENT_AMBULATORY_CARE_PROVIDER_SITE_OTHER): Payer: Medicare Other | Admitting: *Deleted

## 2016-01-19 DIAGNOSIS — J309 Allergic rhinitis, unspecified: Secondary | ICD-10-CM | POA: Diagnosis not present

## 2016-01-26 ENCOUNTER — Ambulatory Visit (INDEPENDENT_AMBULATORY_CARE_PROVIDER_SITE_OTHER): Payer: Medicare Other | Admitting: *Deleted

## 2016-01-26 DIAGNOSIS — J309 Allergic rhinitis, unspecified: Secondary | ICD-10-CM | POA: Diagnosis not present

## 2016-02-02 ENCOUNTER — Ambulatory Visit (INDEPENDENT_AMBULATORY_CARE_PROVIDER_SITE_OTHER): Payer: Medicare Other | Admitting: *Deleted

## 2016-02-02 DIAGNOSIS — J309 Allergic rhinitis, unspecified: Secondary | ICD-10-CM | POA: Diagnosis not present

## 2016-02-02 NOTE — Progress Notes (Signed)
Didn't realize I had charged pt. Twice until it too late.

## 2016-02-09 ENCOUNTER — Ambulatory Visit (INDEPENDENT_AMBULATORY_CARE_PROVIDER_SITE_OTHER): Payer: Medicare Other | Admitting: *Deleted

## 2016-02-09 DIAGNOSIS — J309 Allergic rhinitis, unspecified: Secondary | ICD-10-CM | POA: Diagnosis not present

## 2016-02-16 ENCOUNTER — Ambulatory Visit (INDEPENDENT_AMBULATORY_CARE_PROVIDER_SITE_OTHER): Payer: Medicare Other | Admitting: *Deleted

## 2016-02-16 DIAGNOSIS — J309 Allergic rhinitis, unspecified: Secondary | ICD-10-CM

## 2016-02-16 NOTE — Progress Notes (Signed)
I only gave pt. One shot (vial B) Vial A had run out. So please just charge her for one shot this time. I will have vial A ready next wk.. Thanks.  [employer]

## 2016-02-23 ENCOUNTER — Ambulatory Visit (INDEPENDENT_AMBULATORY_CARE_PROVIDER_SITE_OTHER): Payer: Medicare Other | Admitting: *Deleted

## 2016-02-23 DIAGNOSIS — J309 Allergic rhinitis, unspecified: Secondary | ICD-10-CM | POA: Diagnosis not present

## 2016-03-01 ENCOUNTER — Ambulatory Visit (INDEPENDENT_AMBULATORY_CARE_PROVIDER_SITE_OTHER): Payer: Medicare Other | Admitting: *Deleted

## 2016-03-01 DIAGNOSIS — J309 Allergic rhinitis, unspecified: Secondary | ICD-10-CM | POA: Diagnosis not present

## 2016-03-07 ENCOUNTER — Ambulatory Visit (INDEPENDENT_AMBULATORY_CARE_PROVIDER_SITE_OTHER): Payer: Medicare Other | Admitting: *Deleted

## 2016-03-07 DIAGNOSIS — J309 Allergic rhinitis, unspecified: Secondary | ICD-10-CM | POA: Diagnosis not present

## 2016-03-15 ENCOUNTER — Ambulatory Visit (INDEPENDENT_AMBULATORY_CARE_PROVIDER_SITE_OTHER): Payer: Medicare Other | Admitting: *Deleted

## 2016-03-15 DIAGNOSIS — J309 Allergic rhinitis, unspecified: Secondary | ICD-10-CM

## 2016-03-21 ENCOUNTER — Ambulatory Visit (INDEPENDENT_AMBULATORY_CARE_PROVIDER_SITE_OTHER): Payer: Medicare Other

## 2016-03-21 DIAGNOSIS — J309 Allergic rhinitis, unspecified: Secondary | ICD-10-CM

## 2016-03-22 ENCOUNTER — Ambulatory Visit: Payer: Medicare Other

## 2016-03-29 ENCOUNTER — Ambulatory Visit (INDEPENDENT_AMBULATORY_CARE_PROVIDER_SITE_OTHER): Payer: Medicare Other | Admitting: *Deleted

## 2016-03-29 DIAGNOSIS — J309 Allergic rhinitis, unspecified: Secondary | ICD-10-CM

## 2016-04-05 ENCOUNTER — Ambulatory Visit (INDEPENDENT_AMBULATORY_CARE_PROVIDER_SITE_OTHER): Payer: Medicare Other

## 2016-04-05 DIAGNOSIS — J309 Allergic rhinitis, unspecified: Secondary | ICD-10-CM

## 2016-04-12 ENCOUNTER — Ambulatory Visit (INDEPENDENT_AMBULATORY_CARE_PROVIDER_SITE_OTHER): Payer: Medicare Other | Admitting: *Deleted

## 2016-04-12 DIAGNOSIS — J309 Allergic rhinitis, unspecified: Secondary | ICD-10-CM

## 2016-04-19 ENCOUNTER — Ambulatory Visit (INDEPENDENT_AMBULATORY_CARE_PROVIDER_SITE_OTHER): Payer: Medicare Other | Admitting: *Deleted

## 2016-04-19 DIAGNOSIS — J309 Allergic rhinitis, unspecified: Secondary | ICD-10-CM | POA: Diagnosis not present

## 2016-04-26 ENCOUNTER — Ambulatory Visit (INDEPENDENT_AMBULATORY_CARE_PROVIDER_SITE_OTHER): Payer: Medicare Other

## 2016-04-26 DIAGNOSIS — J309 Allergic rhinitis, unspecified: Secondary | ICD-10-CM

## 2016-05-03 ENCOUNTER — Ambulatory Visit (INDEPENDENT_AMBULATORY_CARE_PROVIDER_SITE_OTHER): Payer: Medicare Other | Admitting: *Deleted

## 2016-05-03 DIAGNOSIS — J309 Allergic rhinitis, unspecified: Secondary | ICD-10-CM | POA: Diagnosis not present

## 2016-05-06 ENCOUNTER — Ambulatory Visit (INDEPENDENT_AMBULATORY_CARE_PROVIDER_SITE_OTHER): Payer: Medicare Other

## 2016-05-06 DIAGNOSIS — Z23 Encounter for immunization: Secondary | ICD-10-CM | POA: Diagnosis not present

## 2016-05-10 ENCOUNTER — Ambulatory Visit (INDEPENDENT_AMBULATORY_CARE_PROVIDER_SITE_OTHER): Payer: Medicare Other | Admitting: *Deleted

## 2016-05-10 DIAGNOSIS — J309 Allergic rhinitis, unspecified: Secondary | ICD-10-CM | POA: Diagnosis not present

## 2016-05-17 ENCOUNTER — Ambulatory Visit (INDEPENDENT_AMBULATORY_CARE_PROVIDER_SITE_OTHER): Payer: Medicare Other

## 2016-05-17 DIAGNOSIS — J309 Allergic rhinitis, unspecified: Secondary | ICD-10-CM | POA: Diagnosis not present

## 2016-05-24 ENCOUNTER — Ambulatory Visit (INDEPENDENT_AMBULATORY_CARE_PROVIDER_SITE_OTHER): Payer: Medicare Other | Admitting: *Deleted

## 2016-05-24 DIAGNOSIS — J309 Allergic rhinitis, unspecified: Secondary | ICD-10-CM

## 2016-05-31 ENCOUNTER — Ambulatory Visit (INDEPENDENT_AMBULATORY_CARE_PROVIDER_SITE_OTHER): Payer: Medicare Other | Admitting: *Deleted

## 2016-05-31 DIAGNOSIS — J309 Allergic rhinitis, unspecified: Secondary | ICD-10-CM

## 2016-06-07 ENCOUNTER — Ambulatory Visit (INDEPENDENT_AMBULATORY_CARE_PROVIDER_SITE_OTHER): Payer: Medicare Other | Admitting: *Deleted

## 2016-06-07 ENCOUNTER — Ambulatory Visit (INDEPENDENT_AMBULATORY_CARE_PROVIDER_SITE_OTHER): Payer: Medicare Other | Admitting: Internal Medicine

## 2016-06-07 ENCOUNTER — Encounter: Payer: Self-pay | Admitting: Internal Medicine

## 2016-06-07 VITALS — BP 122/82 | HR 91 | Ht 63.0 in | Wt 221.2 lb

## 2016-06-07 DIAGNOSIS — J3089 Other allergic rhinitis: Secondary | ICD-10-CM | POA: Diagnosis not present

## 2016-06-07 DIAGNOSIS — J4520 Mild intermittent asthma, uncomplicated: Secondary | ICD-10-CM | POA: Diagnosis not present

## 2016-06-07 DIAGNOSIS — J328 Other chronic sinusitis: Secondary | ICD-10-CM

## 2016-06-07 DIAGNOSIS — J449 Chronic obstructive pulmonary disease, unspecified: Secondary | ICD-10-CM

## 2016-06-07 DIAGNOSIS — J302 Other seasonal allergic rhinitis: Secondary | ICD-10-CM

## 2016-06-07 DIAGNOSIS — J309 Allergic rhinitis, unspecified: Secondary | ICD-10-CM | POA: Diagnosis not present

## 2016-06-07 MED ORDER — ALBUTEROL SULFATE HFA 108 (90 BASE) MCG/ACT IN AERS: INHALATION_SPRAY | RESPIRATORY_TRACT | 12 refills | Status: DC

## 2016-06-07 NOTE — Patient Instructions (Signed)
As discussed- we will stop allergy vaccine when your current supply runs out, or at the end of December, which ever comes first.  Ok to continue fluticasone nasal spray and antihistamine as needed   Refill script sent for Proair albuterol rescue inhaler  Please call as needed  Order- Office spirometry    Dx asthma mild intermittent  Please call as needed

## 2016-06-07 NOTE — Progress Notes (Signed)
Patient ID: Bonnie Ellison, female    DOB: 11/16/1945, 70 y.o.   MRN: [id number]  HPI F never smoker followed for chronic sinusitis, rhinitis, chronic obstructive asthma, obesity hypoventilation Office Spirometry 06/07/2016-moderate obstructive airways disease with restriction of exhaled volume. FVC 1.59/70%, FEV1 1.15/65%, ratio 0.72, FEF 25-75% 0.85/51%    11/30/2015-70 year old female never smoker followed for chronic sinusitis, rhinitis, chronic obstructive asthma, obesity/hypoventilation complicated by celiac disease Allergy vaccine 1:50000 GH highest tolerated FOLLOWS FOR: Still on allergy vaccine and no reactions. Continues using Allegra OTC when needed.  06/07/2016-70 year old female never smoker followed for chronic sinusitis, rhinitis, chronic obstructive asthma, obesity/hypoventilation, complicated by celiac disease Allergy vaccine 1:50,000 GH highest tolerated FOLLOWS FOR: Pt states that she is tolerating vaccines well. No new complaints.  Chronic sinus disease symptomatic mainly as drainage with no frontal headache. Nothing purulent recently. She had refused sinus surgery when offered Chest feels fine now with rare need for rescue inhaler-once or twice a month. Office Spirometry 06/07/2016-moderate obstructive airways disease with restriction of exhaled volume. FVC 1.59/70%, FEV1 1.15/65%, ratio 0.72, FEF 25-75% 0.85/51%  Review of Systems-see HPI Constitutional:   No-   weight loss, night sweats, fevers, chills, fatigue, lassitude. HEENT:    headaches,  No-difficulty swallowing, tooth/dental problems, sore throat,       No-  sneezing, itching, ear ache, +nasal congestion, + post nasal drip,  CV:  No-   chest pain, orthopnea, PND, swelling in lower extremities, anasarca, dizziness, palpitations Resp: Increased shortness of breath with exertion or at rest.              No-   productive cough,  No non-productive cough,  No- coughing up of blood.              No-   change  in color of mucus.  Little recent wheezing.   Skin: No-   rash or lesions. No hives. GI:  No-   heartburn, indigestion, abdominal pain, nausea, vomiting,  GU:  MS:  No-   joint pain or swelling.   Neuro-     nothing unusual Psych:  No- change in mood or affect. No depression or anxiety.  No memory loss. Objective:   Physical Exam General- Alert, Oriented, Affect-appropriate, Distress- none acute, overweight Skin- rash-none, lesions- none, excoriation- none Lymphadenopathy- none Head- atraumatic            Eyes- Gross vision intact, PERRLA, conjunctivae clear secretions. +Chronic periorbital edema            Ears- Hearing, canals-normal, TMs clear            Nose- +turbinate edema, no-Septal dev, mucus, polyps, erosion, perforation             Throat- Mallampati II , mucosa clear , drainage- none, tonsils- atrophic, clear w/o swelling. Much dental repair. Neck- flexible , trachea midline, no stridor , thyroid nl, carotid no bruit Chest - symmetrical excursion , unlabored           Heart/CV- RRR , no murmur , no gallop  , no rub, nl s1 s2                           - JVD- none , edema- none, stasis changes- none, varices- none           Lung-  clear , cough- none , dullness-none, rub- none           Chest wall-  Abd- Br/ Gen/ Rectal- Not done, not indicated Extrem- cyanosis- none, clubbing, none, atrophy- none, strength- nl Neuro- grossly intact to observation

## 2016-06-09 NOTE — Assessment & Plan Note (Signed)
She thought allergy vaccine had helped but was never able to tolerate dose increased because of local reaction. Plan-we are closing the allergy vaccine program at this office in this is an appropriate time for her to stop vaccine shots. She can seek reassessment at another allergy office in the future if needed. Continue Flonase and antihistamines as needed

## 2016-06-09 NOTE — Assessment & Plan Note (Signed)
Mostly chronic fixed obstruction with a restrictive component which may reflect obesity and/air trapping Plan-refill rescue inhaler. Consider trial of LABA/ LAMA

## 2016-06-09 NOTE — Assessment & Plan Note (Signed)
At baseline. She had refused sinus surgery when offered. Plan-saline nasal rinse, occasional antibiotic if needed

## 2016-06-14 ENCOUNTER — Other Ambulatory Visit: Payer: Self-pay | Admitting: Internal Medicine

## 2016-06-14 ENCOUNTER — Ambulatory Visit (INDEPENDENT_AMBULATORY_CARE_PROVIDER_SITE_OTHER): Payer: Medicare Other | Admitting: *Deleted

## 2016-06-14 DIAGNOSIS — J019 Acute sinusitis, unspecified: Secondary | ICD-10-CM

## 2016-06-14 DIAGNOSIS — J309 Allergic rhinitis, unspecified: Secondary | ICD-10-CM

## 2016-06-14 NOTE — Progress Notes (Signed)
Pt. Is stopping shots,per CY. Clinic is closing.

## 2016-06-18 ENCOUNTER — Other Ambulatory Visit: Payer: Self-pay | Admitting: Gynecology

## 2016-06-18 DIAGNOSIS — Z1231 Encounter for screening mammogram for malignant neoplasm of breast: Secondary | ICD-10-CM

## 2016-06-29 IMAGING — CR DG ABDOMEN ACUTE W/ 1V CHEST
3 series · 3 of 3 positions shown · non-contrast
Comparison: Preoperative CT Abdomen and Pelvis 10/25/2014 and
earlier.

CLINICAL DATA: 68-year-old female with nausea and vomiting status
post cholecystectomy 11/01/14. Initial encounter.

EXAM:
DG ABDOMEN ACUTE W/ 1V CHEST

[chest pa]
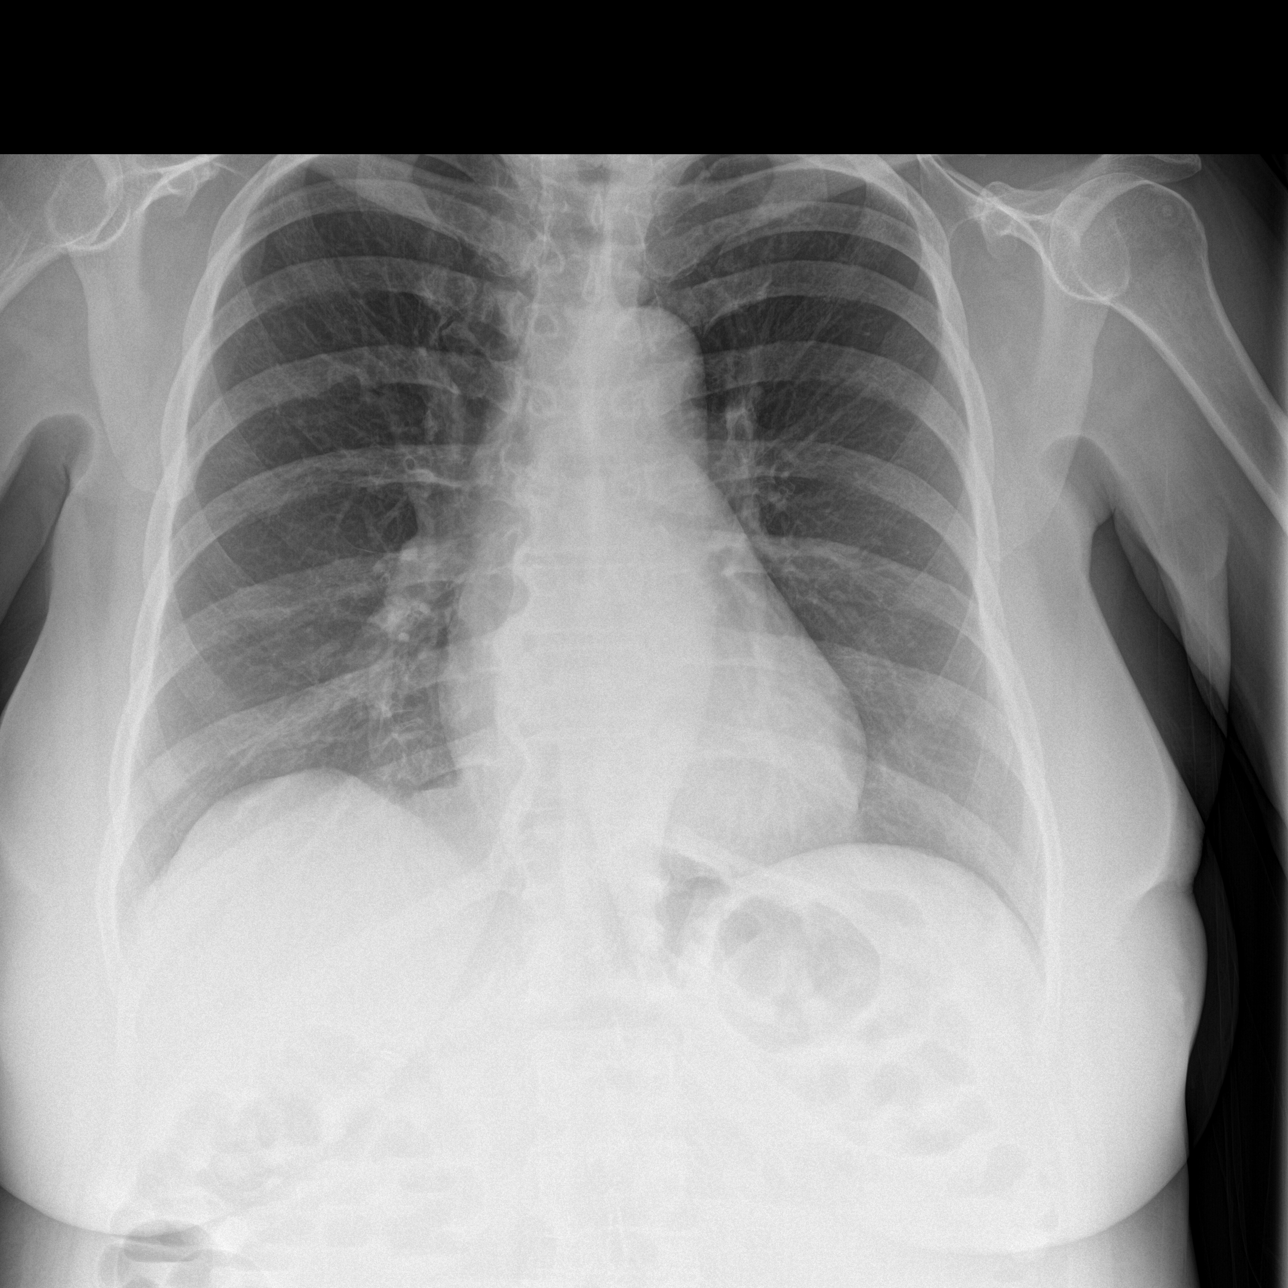

[abdomen erect]
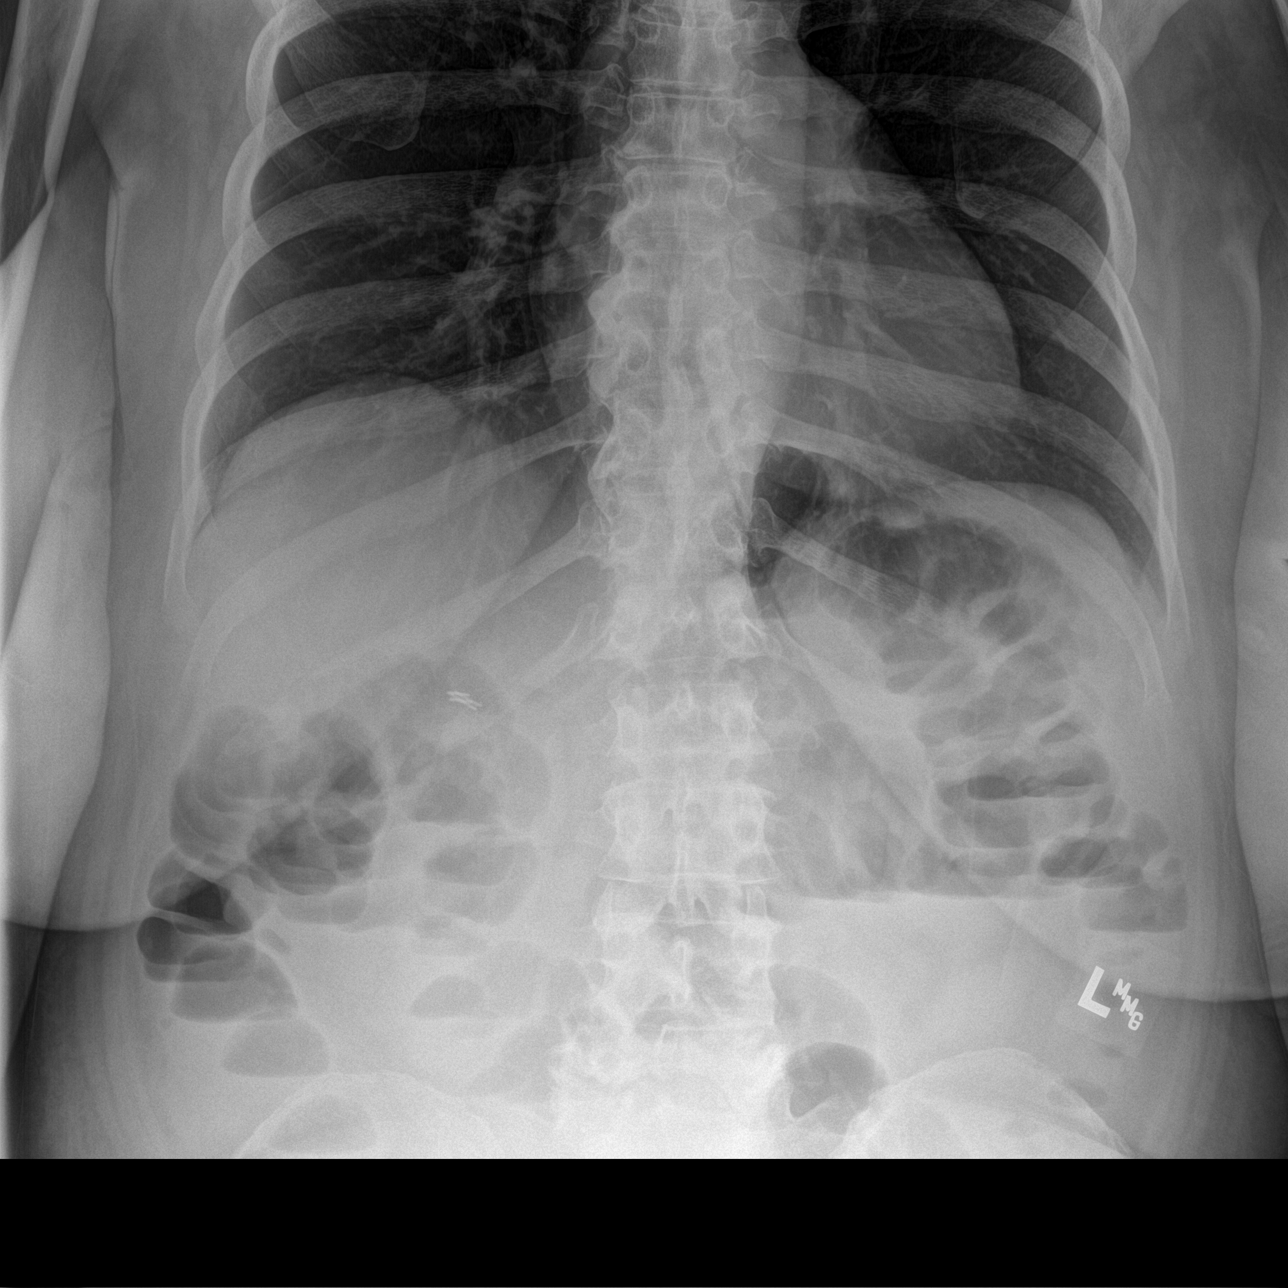

[abdomen supine]
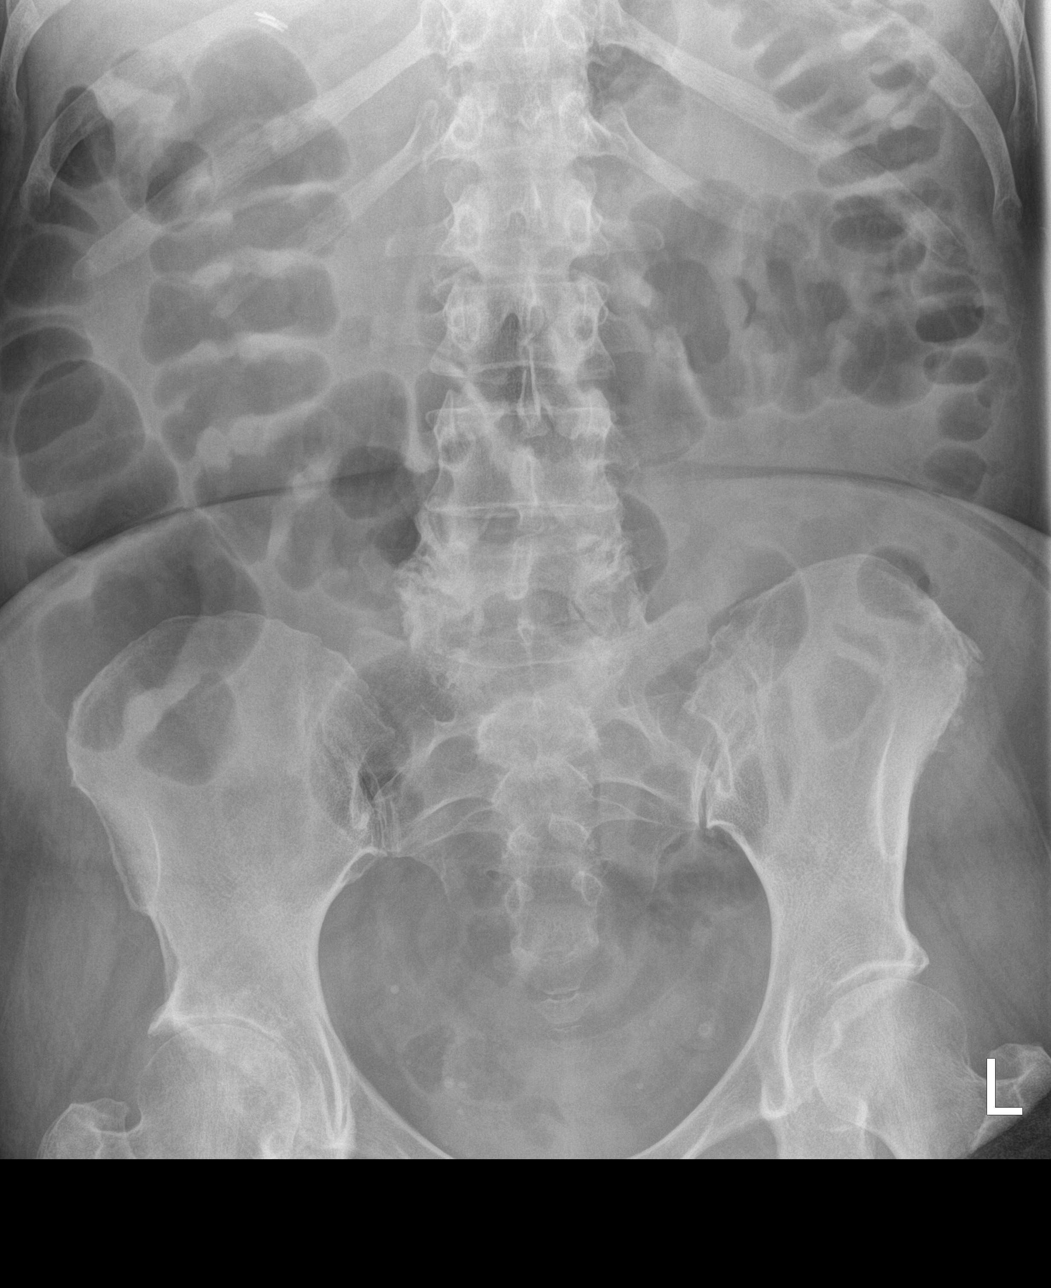

[3 of 3 positions shown; findings below may reference images not displayed]

FINDINGS: Normal cardiac size and mediastinal contours. No pneumothorax or
pneumoperitoneum. No confluent pulmonary opacity.

Cholecystectomy clips. Air-fluid levels in the colon which is
nondilated. Upper limits of normal mid abdominal small bowel loops,
3 cm diameter. Otherwise negative bowel gas pattern. Abdominal and
pelvic visceral contours are within normal limits. No acute osseous
abnormality identified. Degenerative changes at the right hip.
IMPRESSION: 1. Non obstructed bowel gas pattern with air-fluid levels in the
colon which may indicate diarrhea or enema use.
2. No free air.  No acute cardiopulmonary abnormality.

## 2016-07-11 ENCOUNTER — Ambulatory Visit
Admission: RE | Admit: 2016-07-11 | Discharge: 2016-07-11 | Disposition: A | Discharge status: Home / Self Care | Payer: Medicare Other | Source: Ambulatory Visit | Attending: Gynecology | Admitting: Gynecology

## 2016-07-11 DIAGNOSIS — Z1231 Encounter for screening mammogram for malignant neoplasm of breast: Secondary | ICD-10-CM

## 2016-08-12 ENCOUNTER — Ambulatory Visit (INDEPENDENT_AMBULATORY_CARE_PROVIDER_SITE_OTHER): Payer: Medicare Other | Admitting: Gynecology

## 2016-08-12 ENCOUNTER — Encounter: Payer: Self-pay | Admitting: Gynecology

## 2016-08-12 VITALS — BP 130/82 | Ht 62.0 in | Wt 219.0 lb

## 2016-08-12 DIAGNOSIS — N952 Postmenopausal atrophic vaginitis: Secondary | ICD-10-CM | POA: Diagnosis not present

## 2016-08-12 DIAGNOSIS — M8580 Other specified disorders of bone density and structure, unspecified site: Secondary | ICD-10-CM

## 2016-08-12 DIAGNOSIS — Z01411 Encounter for gynecological examination (general) (routine) with abnormal findings: Secondary | ICD-10-CM

## 2016-08-12 DIAGNOSIS — N898 Other specified noninflammatory disorders of vagina: Secondary | ICD-10-CM | POA: Diagnosis not present

## 2016-08-12 LAB — WET PREP FOR TRICH, YEAST, CLUE
Clue Cells Wet Prep HPF POC: NONE SEEN
Trich, Wet Prep: NONE SEEN
WBC, Wet Prep HPF POC: NONE SEEN
Yeast Wet Prep HPF POC: NONE SEEN

## 2016-08-12 MED ORDER — NYSTATIN-TRIAMCINOLONE 100000-0.1 UNIT/GM-% EX OINT: 1.0000 "application " | TOPICAL_OINTMENT | Freq: Two times a day (BID) | CUTANEOUS | 0 refills | Status: DC

## 2016-08-12 NOTE — Progress Notes (Signed)
    Bonnie Ellison [birth year] [id number]        71 y.o.  G2P2002 for annual exam. Patient also is complaining of irritation on the outside of her vagina that comes and goes intermittently. No discharge or odor. No urinary symptoms such as frequency dysuria or urgency. No fever or chills.  Past medical history,surgical history, problem list, medications, allergies, family history and social history were all reviewed and documented as reviewed in the EPIC chart.  ROS:  Performed with pertinent positives and negatives included in the history, assessment and plan.   Additional significant findings :  None   Exam: Kennon PortelaKim Gardner assistant Vitals:   08/12/16 1050  BP: 130/82  Weight: 219 lb (99.3 kg)  Height: 5\' 2"  (1.575 m)   Body mass index is 40.06 kg/m.  General appearance:  Normal affect, orientation and appearance. Skin: Grossly normal HEENT: Without gross lesions.  No cervical or supraclavicular adenopathy. Thyroid normal.  Lungs:  Clear without wheezing, rales or rhonchi Cardiac: RR, without RMG Abdominal:  Soft, nontender, without masses, guarding, rebound, organomegaly or hernia Breasts:  Examined lying and sitting without masses, retractions, discharge or axillary adenopathy. Pelvic:  Ext, BUS, Vagina with atrophic changes. Slight white discharge noted.  Adnexa without masses or tenderness    Anus and perineum normal   Rectovaginal normal sphincter tone without palpated masses or tenderness.    Assessment/Plan:  71 y.o. [id number] female for annual exam.   1. Vaginal irritation intermittently.  Wet prep was negative. I suspect that she is getting some intermittent yeast externally. Options for management reviewed with her. Will go ahead and start her on Mycolog intermittently as needed for symptoms. Assuming she gets a good response with intermittent treatment them will continue this. If this does not help that she'll follow up for reevaluation. 2. Postmenopausal/atrophic  genital changes. Had previously been on estradiol but discontinued. Doing well without significant hot flushes, night sweats, vaginal dryness. Status post TVH/BSO in the past for leiomyoma. 3. Pap smear 2011. No Pap smear done today. No history of significant abnormal Pap smears. Per current screening guidelines we both agreed to stop screening based on age and hysterectomy history. 4. Colonoscopy 2016. Repeat at their recommended interval. 5. Mammography 06/2016. Continue with annual mammography when due. SBE monthly reviewed. 6. Osteopenia. DEXA 07/2015 T score -1.5 FRAX 4%/0.5%. Plan repeat DEXA next year at 2 year interval. Increased calcium vitamin D. 7. Health maintenance. No routine lab work done as this is done elsewhere. Follow up1 year, sooner as needed.  Additional time in excess of her breast and pelvic exam was spent in direct face to face counseling and coordination of care in regards to her vaginitis symptoms and treatment.    Dara LordsFONTAINE,TIMOTHY P MD, 11:20 AM 08/12/2016

## 2016-08-12 NOTE — Patient Instructions (Signed)
Apply the prescribed cream to the outside of the vagina as needed for irritation. Follow up with the physician if the symptoms continue.

## 2016-08-27 ENCOUNTER — Other Ambulatory Visit: Payer: Self-pay | Admitting: *Deleted

## 2016-08-27 DIAGNOSIS — J019 Acute sinusitis, unspecified: Secondary | ICD-10-CM

## 2016-08-27 MED ORDER — FLUTICASONE PROPIONATE 50 MCG/ACT NA SUSP: 2.0000 | Freq: Two times a day (BID) | NASAL | 3 refills | Status: DC

## 2016-10-02 ENCOUNTER — Ambulatory Visit (HOSPITAL_COMMUNITY)
Admission: EM | Admit: 2016-10-02 | Discharge: 2016-10-02 | Disposition: A | Discharge status: Home / Self Care | Payer: Medicare Other | Attending: Emergency Medicine | Admitting: Emergency Medicine

## 2016-10-02 ENCOUNTER — Encounter (HOSPITAL_COMMUNITY): Payer: Self-pay | Admitting: *Deleted

## 2016-10-02 DIAGNOSIS — M7662 Achilles tendinitis, left leg: Secondary | ICD-10-CM

## 2016-10-02 MED ORDER — IBUPROFEN 600 MG PO TABS: 600.0000 mg | ORAL_TABLET | Freq: Four times a day (QID) | ORAL | 0 refills | Status: AC | PRN

## 2016-10-02 MED ORDER — PREDNISONE 20 MG PO TABS: 40.0000 mg | ORAL_TABLET | Freq: Every day | ORAL | 0 refills | Status: AC

## 2016-10-02 NOTE — Discharge Instructions (Signed)
600 mg of ibuprofen with 1 g of Tylenol 3-4 times a day as needed for pain. Follow-up with Dr. August Saucerean, orthopedics in a week if you're not better after finishing the medications. Go to the ER for the signs and symptoms we discussed

## 2016-10-02 NOTE — ED Triage Notes (Signed)
Pain  And  Swelling  l  Ankle   Since  Yesterday  Denies  Any  Injury   Denies   Any  Shortness  Of  breath

## 2016-10-02 NOTE — ED Provider Notes (Signed)
HPI  SUBJECTIVE:  Bonnie Ellison is a 71 y.o. female who presents with left posterior ankle swelling and pain described as sore and constant starting yesterday. Symptoms are better with elevation, worse with weightbearing and hanging her foot in dependent position. No bruising, erythema, numbness, tingling in her foot. No trauma, fall, twisting mechanism. No change in her physical activity, swelling. No fevers. No fluoroquinolone use. She has not heard or felt a pop along her Achilles tendon. She is never had symptoms like this before. She has a past medical history of hypertension, asthma. No history of diabetes, kidney disease, GI bleed, regular NSAID use. QIO:NGEX,BMWUXLKPMD:WONG,FRANCIS PATRICK, MD     Past Medical History:  Diagnosis Date  . Acid reflux   . Allergic rhinitis, cause unspecified   . Celiac disease   . Chronic airway obstruction, not elsewhere classified   . GERD (gastroesophageal reflux disease)   . Hypertension   . Osteopenia 07/2015   T score -1.5 FRAX 4%/0.5%  . Other chronic sinusitis   . Other hyperalimentation   . Unspecified asthma(493.90)   . Urticaria   . Vertigo     Past Surgical History:  Procedure Laterality Date  . ABDOMINAL HYSTERECTOMY  1991   ABDOMINAL HYSTERECTOMY  leiomyomata  . CATARACT EXTRACTION     RIGHT EYE  . CESAREAN SECTION    . CHOLECYSTECTOMY N/A 10/31/2014   Procedure: LAPAROSCOPIC CHOLECYSTECTOMY;  Surgeon: Emelia LoronMatthew Wakefield, MD;  [facility];  Service: General;  Laterality: N/A;  . EYE SURGERY     DETACHED RETINA RIGHT EYE...   . TOTAL ABDOMINAL HYSTERECTOMY W/ BILATERAL SALPINGOOPHORECTOMY      Family History  Problem Relation Age of Onset  . Heart failure Mother   . Heart disease Mother   . Pancreatic cancer Father   . Cancer Father [id number]    PANCREATIC (DECEASED)  . Diabetes Brother     DEACESED.Marland Kitchen. HAD A KIDNEY TRANSPLANT  . Stroke Brother [phone]    DECEASED  . Breast cancer Sister [id number]    Social History  Substance Use Topics  .  Smoking status: Never Smoker  . Smokeless tobacco: Never Used  . Alcohol use No    No current facility-administered medications for this encounter.   Current Outpatient Prescriptions:  .  acetaminophen (TYLENOL) 325 MG tablet, Take 650 mg by mouth every 6 (six) hours as needed for mild pain. , Disp: , Rfl:  .  albuterol (PROAIR HFA) 108 (90 Base) MCG/ACT inhaler, USE 2 PUFFS BY MOUTH EVERY 6 HOURS AS NEEDED FOR WHEEZING AND SHORTNESS OF BREATH, Disp: 8.5 Inhaler, Rfl: 12 .  amLODipine (NORVASC) 5 MG tablet, Take 0.5 tablets by mouth 2 (two) times daily., Disp: , Rfl:  .  Calcium Carb-Cholecalciferol (CALCIUM 600/VITAMIN D3) 600-800 MG-UNIT TABS, Take 1 tablet by mouth 2 (two) times daily., Disp: , Rfl:  .  diclofenac sodium (VOLTAREN) 1 % GEL, APPLY TO AFFECTED AREA TWICE A DAY TRANSDERMALLY AS DIRECTED, Disp: , Rfl: 2 .  fexofenadine (ALLEGRA) 180 MG tablet, Take 180 mg by mouth daily.  , Disp: , Rfl:  .  fluticasone (FLONASE) 50 MCG/ACT nasal spray, Place 2 sprays into both nostrils 2 (two) times daily., Disp: 48 g, Rfl: 3 .  ibuprofen (ADVIL,MOTRIN) 600 MG tablet, Take 1 tablet (600 mg total) by mouth every 6 (six) hours as needed., Disp: 28 tablet, Rfl: 0 .  irbesartan (AVAPRO) 150 MG tablet, Take 150 mg by mouth daily., Disp: , Rfl: 1 .  KLOR-CON M20  20 MEQ tablet, Take 1 tablet by mouth daily. , Disp: , Rfl: 3 .  nystatin-triamcinolone ointment (MYCOLOG), Apply 1 application topically 2 (two) times daily., Disp: 30 g, Rfl: 0 .  omeprazole (PRILOSEC) 40 MG capsule, Take 40 mg by mouth daily as needed (heartburn). , Disp: , Rfl:  .  predniSONE (DELTASONE) 20 MG tablet, Take 2 tablets (40 mg total) by mouth daily with breakfast., Disp: 10 tablet, Rfl: 0 .  spironolactone (ALDACTONE) 25 MG tablet, Take 25 mg by mouth daily., Disp: , Rfl: 0  Allergies  Allergen Reactions  . Shellfish Allergy Anaphylaxis    throat swelling, hives  . Atorvastatin     Leg cramps  . Wheat Bran Itching      ROS  As noted in HPI.   Physical Exam  BP 139/78 (BP Location: Left Arm)   Pulse 100   Temp 98.6 F (37 C)   Resp 18   LMP 05/10/1990   SpO2 100%   Constitutional: Well developed, well nourished, no acute distress Eyes:  EOMI, conjunctiva normal bilaterally HENT: Normocephalic, atraumatic,mucus membranes moist Respiratory: Normal inspiratory effort Cardiovascular: Normal rate GI: nondistended skin: No rash, skin intact Musculoskeletal: L Ankle Tenderness at the insertion of the Achilles tendon. No erythema, increased temperature, No soft tissue defects. Positive nontender swelling posteriorly and medially in between the Achilles tendon and the distal fibula., Distal fibula NT , Medial malleolus NT,  Deltoid ligament medially NT,  Lateral ligaments NT, ATFL laterally NT, posterior tablofibular ligament laterally NT ,  calcaneus  NT,  Proximal 5th metatarsal NT, Midfoot NT, distal NVI with baseline sensation / motor to foot with CR<2 seconds.  - bruising. Thompson test negative. Pain with plantar flexion. No pain with dorsiflexion, inversion/eversion. Pt able to bear weight in dept.  Neurologic: Alert & oriented x 3, no focal neuro deficits Psychiatric: Speech and behavior appropriate   ED Course   Medications - No data to display  No orders of the defined types were placed in this encounter.   No results found for this or any previous visit (from the past 24 hour(s)). No results found.  ED Clinical Impression  Tendonitis, Achilles, left   ED Assessment/Plan  Patient is most consistent with an Achilles tendinitis. No historical mechanism for Achilles tendon rupture, however this is in the differential. Doubt reactive tendinitis. No evidence of infection. Her ankle and foot were completely normal so imaging was not done. Plan to send home with ibuprofen 600 mg 1 g of Tylenol 3-4 times a day, 40 mg prednisone for 5 days. Follow-up with Dr. August Saucer, [facility] orthopedics on  call if not better in a week to 10 days.  Discussed  MDM, plan and followup with patient. Discussed sn/sx that should prompt return to the ED. Patient agrees with plan.   Meds ordered this encounter  Medications  . ibuprofen (ADVIL,MOTRIN) 600 MG tablet    Sig: Take 1 tablet (600 mg total) by mouth every 6 (six) hours as needed.    Dispense:  28 tablet    Refill:  0  . predniSONE (DELTASONE) 20 MG tablet    Sig: Take 2 tablets (40 mg total) by mouth daily with breakfast.    Dispense:  10 tablet    Refill:  0    *This clinic note was created using Scientist, clinical (histocompatibility and immunogenetics). Therefore, there may be occasional mistakes despite careful proofreading.  ?   Domenick Gong, MD 10/02/16 1216

## 2016-11-13 ENCOUNTER — Other Ambulatory Visit: Payer: Self-pay | Admitting: Internal Medicine

## 2016-11-13 DIAGNOSIS — J019 Acute sinusitis, unspecified: Secondary | ICD-10-CM

## 2017-01-17 ENCOUNTER — Ambulatory Visit (HOSPITAL_COMMUNITY)
Admission: EM | Admit: 2017-01-17 | Discharge: 2017-01-17 | Disposition: A | Discharge status: Home / Self Care | Payer: Medicare Other | Attending: Internal Medicine | Admitting: Internal Medicine

## 2017-01-17 ENCOUNTER — Encounter (HOSPITAL_COMMUNITY): Payer: Self-pay | Admitting: *Deleted

## 2017-01-17 DIAGNOSIS — J069 Acute upper respiratory infection, unspecified: Secondary | ICD-10-CM

## 2017-01-17 DIAGNOSIS — B9789 Other viral agents as the cause of diseases classified elsewhere: Secondary | ICD-10-CM | POA: Diagnosis not present

## 2017-01-17 MED ORDER — HYDROCOD POLST-CPM POLST ER 10-8 MG/5ML PO SUER: 5.0000 mL | Freq: Two times a day (BID) | ORAL | 0 refills | Status: AC | PRN

## 2017-01-17 NOTE — ED Provider Notes (Signed)
CSN: [id number]     Arrival date & time 01/17/17  1711 History   None    Chief Complaint  Patient presents with  . Cough   (Consider location/radiation/quality/duration/timing/severity/associated sxs/prior Treatment) 71 year old female with history of asthma comes in with 6 day history of cough and congestion. Patient states symptoms started out with nasal congestion and sinus pressure. 4 days ago, started having productive cough. She states that cough is minimal throughout the day, is worse at night, and first thing in the morning. Productive cough is mostly in the morning. She has rhinorrhea, nasal congestion, sinus pressure. She has had some wheezing at night, with relief using albuterol. Denies fever, chills, night sweats. Denies ear pain, eye pain, abdominal pain, nausea vomiting diarrhea. Denies sick contact.      Past Medical History:  Diagnosis Date  . Acid reflux   . Allergic rhinitis, cause unspecified   . Celiac disease   . Chronic airway obstruction, not elsewhere classified   . GERD (gastroesophageal reflux disease)   . Hypertension   . Osteopenia 07/2015   T score -1.5 FRAX 4%/0.5%  . Other chronic sinusitis   . Other hyperalimentation   . Unspecified asthma(493.90)   . Urticaria   . Vertigo    Past Surgical History:  Procedure Laterality Date  . ABDOMINAL HYSTERECTOMY  1991   ABDOMINAL HYSTERECTOMY  leiomyomata  . CATARACT EXTRACTION     RIGHT EYE  . CESAREAN SECTION    . CHOLECYSTECTOMY N/A 10/31/2014   Procedure: LAPAROSCOPIC CHOLECYSTECTOMY;  Surgeon: Emelia Loron, MD;  Location: [facility] OR;  Service: General;  Laterality: N/A;  . EYE SURGERY     DETACHED RETINA RIGHT EYE...   . TOTAL ABDOMINAL HYSTERECTOMY W/ BILATERAL SALPINGOOPHORECTOMY     Family History  Problem Relation Age of Onset  . Heart failure Mother   . Heart disease Mother   . Pancreatic cancer Father   . Cancer Father 55       PANCREATIC (DECEASED)  . Diabetes Brother        DEACESED.Marland Kitchen  HAD A KIDNEY TRANSPLANT  . Stroke Brother 4       DECEASED  . Breast cancer Sister 72   Social History  Substance Use Topics  . Smoking status: Never Smoker  . Smokeless tobacco: Never Used  . Alcohol use No   OB History    Gravida Para Term Preterm AB Living   2 2 2     2    SAB TAB Ectopic Multiple Live Births           2     Review of Systems  Constitutional: Negative for chills, fatigue and fever.  HENT: Positive for congestion, postnasal drip, rhinorrhea and sinus pressure. Negative for ear discharge, ear pain, sinus pain, sore throat and trouble swallowing.   Eyes: Negative for pain, discharge, redness and itching.  Respiratory: Positive for cough. Negative for shortness of breath and wheezing.   Cardiovascular: Negative for chest pain and palpitations.  Gastrointestinal: Negative for abdominal pain, diarrhea, nausea and vomiting.    Allergies  Shellfish allergy; Atorvastatin; and Wheat bran  Home Medications   Prior to Admission medications   Medication Sig Start Date End Date Taking? Authorizing Provider  acetaminophen (TYLENOL) 325 MG tablet Take 650 mg by mouth every 6 (six) hours as needed for mild pain.     [provider]  albuterol (PROAIR HFA) 108 (90 Base) MCG/ACT inhaler USE 2 PUFFS BY MOUTH EVERY 6 HOURS AS NEEDED FOR  WHEEZING AND SHORTNESS OF BREATH 06/07/16   Jetty Duhamel D, MD  amLODipine (NORVASC) 5 MG tablet Take 0.5 tablets by mouth every morning.  05/12/16   [provider]  Calcium Carb-Cholecalciferol (CALCIUM 600/VITAMIN D3) 600-800 MG-UNIT TABS Take 1 tablet by mouth 2 (two) times daily.    [provider]  chlorpheniramine-HYDROcodone (TUSSIONEX PENNKINETIC ER) 10-8 MG/5ML SUER Take 5 mLs by mouth every 12 (twelve) hours as needed for cough. 01/17/17 01/22/17  Cathie Hoops, Colisha Redler V, PA-C  diclofenac sodium (VOLTAREN) 1 % GEL APPLY TO AFFECTED AREA TWICE A DAY TRANSDERMALLY AS DIRECTED 10/19/15   [provider]  fexofenadine  (ALLEGRA) 180 MG tablet Take 180 mg by mouth daily.      [provider]  fluticasone (FLONASE) 50 MCG/ACT nasal spray USE 2 SPRAYS IN EACH NOSTRIL TWICE A DAY 11/13/16   Young, Clinton D, MD  irbesartan (AVAPRO) 150 MG tablet Take 150 mg by mouth daily. 03/13/15   [provider]  KLOR-CON M20 20 MEQ tablet Take 1 tablet by mouth daily.  12/13/14   [provider]  nystatin-triamcinolone ointment (MYCOLOG) Apply 1 application topically 2 (two) times daily. 08/12/16   Fontaine, Nadyne Coombes, MD  omeprazole (PRILOSEC) 40 MG capsule Take 40 mg by mouth daily as needed (heartburn).     [provider]  spironolactone (ALDACTONE) 25 MG tablet Take 25 mg by mouth daily. 12/29/14   [provider]   Meds Ordered and Administered this Visit  Medications - No data to display  BP 123/74 (BP Location: Right Arm)   Pulse (!) 101   Temp 98.4 F (36.9 C) (Oral)   Resp 14   LMP 05/10/1990   SpO2 99%  No data found.   Physical Exam  Constitutional: She is oriented to person, place, and time. She appears well-developed and well-nourished. No distress.  HENT:  Head: Normocephalic and atraumatic.  Right Ear: Tympanic membrane, external ear and ear canal normal. Tympanic membrane is not erythematous and not bulging.  Left Ear: Tympanic membrane, external ear and ear canal normal. Tympanic membrane is not erythematous and not bulging.  Nose: Right sinus exhibits maxillary sinus tenderness. Right sinus exhibits no frontal sinus tenderness. Left sinus exhibits maxillary sinus tenderness. Left sinus exhibits no frontal sinus tenderness.  Mouth/Throat: Uvula is midline, oropharynx is clear and moist and mucous membranes are normal.  Eyes: Conjunctivae are normal. Pupils are equal, round, and reactive to light.  Neck: Normal range of motion. Neck supple.  Cardiovascular: Normal rate, regular rhythm and normal heart sounds.  Exam reveals no gallop and no friction rub.   No  murmur heard. Pulmonary/Chest: Effort normal and breath sounds normal. She has no decreased breath sounds. She has no wheezes. She has no rhonchi. She has no rales.  Lymphadenopathy:    She has no cervical adenopathy.  Neurological: She is alert and oriented to person, place, and time.  Skin: Skin is warm and dry.  Psychiatric: She has a normal mood and affect. Her behavior is normal. Judgment normal.    Urgent Care Course     Procedures (including critical care time)  Labs Review Labs Reviewed - No data to display  Imaging Review No results found.       MDM   1. Viral URI with cough    Discussed with patient history and exam most consistent with viral URI. Given lung exam clear on auscultation bilaterally with no adventitious lung sounds, O2 sat 99%, no fever, low suspicion  for pneumonia at this visit. Discussed with patient productive cough in the morning most likely due to postnasal drip. Patient to continue Allegra. Start Flonase. Start Tussionex at night as needed for cough. Patient can use over-the-counter decongestants/Mucinex for symptom relief. Discussed with patient to monitor active ingredients and prevent duplicate ingredients. Patient to follow up with PCP if symptoms do not resolve.    Belinda FisherYu, Oluwatobiloba Martin V, PA-C 01/17/17 [phone]

## 2017-01-17 NOTE — ED Triage Notes (Signed)
Pt  Reports   coug     Productive   symptpms   X  6  Days  Pt  Has  A  History  Of   Asthma

## 2017-01-17 NOTE — Discharge Instructions (Signed)
There is no signs of infection today. Continue Allegra for runny nose/sinus congestion. Start Tussionex twice a day as needed for cough. Start Flonase. You can also use over the counter decongestants/musinex for symptom relief. Monitor for duplicate active ingredients on the over the counter medicines. Drink lots of water. If symptoms does not resolve, follow up with PCP for further evaluation.

## 2017-06-09 ENCOUNTER — Ambulatory Visit: Payer: Medicare Other | Admitting: Internal Medicine

## 2017-06-09 ENCOUNTER — Encounter: Payer: Self-pay | Admitting: Internal Medicine

## 2017-06-09 DIAGNOSIS — J328 Other chronic sinusitis: Secondary | ICD-10-CM | POA: Diagnosis not present

## 2017-06-09 DIAGNOSIS — J449 Chronic obstructive pulmonary disease, unspecified: Secondary | ICD-10-CM | POA: Diagnosis not present

## 2017-06-09 MED ORDER — TIOTROPIUM BROMIDE-OLODATEROL 2.5-2.5 MCG/ACT IN AERS: 2.0000 | INHALATION_SPRAY | Freq: Every day | RESPIRATORY_TRACT | 0 refills | Status: DC

## 2017-06-09 NOTE — Progress Notes (Signed)
Patient ID: Bonnie Ellison, female    DOB: Mar 25, 1946, 71 y.o.   MRN: [id number]  HPI F never smoker followed for chronic sinusitis, rhinitis, chronic obstructive asthma, obesity hypoventilation Office Spirometry 06/07/2016-moderate obstructive airways disease with restriction of exhaled volume. FVC 1.59/70%, FEV1 1.15/65%, ratio 0.72, FEF 25-75% 0.85/51%  ---------------------------------------------------------------------------------------  06/07/2016-71 year old female never smoker followed for chronic sinusitis, rhinitis, chronic obstructive asthma, obesity/hypoventilation, complicated by celiac disease Allergy vaccine 1:50,000 GH highest tolerated FOLLOWS FOR: Pt states that she is tolerating vaccines well. No new complaints.  Chronic sinus disease symptomatic mainly as drainage with no frontal headache. Nothing purulent recently. She had refused sinus surgery when offered Chest feels fine now with rare need for rescue inhaler-once or twice a month. Office Spirometry 06/07/2016-moderate obstructive airways disease with restriction of exhaled volume. FVC 1.59/70%, FEV1 1.15/65%, ratio 0.72, FEF 25-75% 0.85/51%  06/09/16- 71 year old female never smoker followed for chronic sinusitis, rhinitis, chronic obstructive asthma, obesity/hypoventilation, complicated by celiac disease --Asthma; Pt states she is doing well overall; slight wheezing at times.  Using rescue inhaler about once a week.  No extreme episodes and no sleep disturbance from her breathing.  Continues Allegra when needed, noticing that season and weather changes make some difference.  Not reporting headache or significant nasal discharge despite her chronic sinus disease.  Review of Systems-see HPI + = positive Constitutional:   No-   weight loss, night sweats, fevers, chills, fatigue, lassitude. HEENT:    headaches,  No-difficulty swallowing, tooth/dental problems, sore throat,       No-  sneezing, itching, ear ache,  +nasal congestion, + post nasal drip,  CV:  No-   chest pain, orthopnea, PND, swelling in lower extremities, anasarca, dizziness, palpitations Resp: Increased shortness of breath with exertion or at rest.              No-   productive cough,  No non-productive cough,  No- coughing up of blood.              No-   change in color of mucus.  Little recent wheezing.   Skin: No-   rash or lesions. No hives. GI:  No-   heartburn, indigestion, abdominal pain, nausea, vomiting,  GU:  MS:  No-   joint pain or swelling.   Neuro-     nothing unusual Psych:  No- change in mood or affect. No depression or anxiety.  No memory loss. Objective:   Physical Exam General- Alert, Oriented, Affect-appropriate, Distress- none acute, + obese Skin- rash-none, lesions- none, excoriation- none Lymphadenopathy- none Head- atraumatic            Eyes- Gross vision intact, PERRLA, conjunctivae clear secretions. +Chronic periorbital edema            Ears- Hearing, canals-normal, TMs clear            Nose- +turbinate edema, no-Septal dev, mucus, polyps, erosion, perforation             Throat- Mallampati II , mucosa clear , drainage- none, tonsils- atrophic, clear w/o swelling. Much dental repair. Neck- flexible , trachea midline, no stridor , thyroid nl, carotid no bruit Chest - symmetrical excursion , unlabored           Heart/CV- RRR , no murmur , no gallop  , no rub, nl s1 s2                           -  JVD- none , edema- none, stasis changes- none, varices- none           Lung-  clear , cough- none , dullness-none, rub- none           Chest wall-  Abd- Br/ Gen/ Rectal- Not done, not indicated Extrem- cyanosis- none, clubbing, none, atrophy- none, strength- nl Neuro- grossly intact to observation

## 2017-06-09 NOTE — Assessment & Plan Note (Signed)
Long-standing chronic sinus changes on imaging.  She had refused surgery and has not benefited from nasal sprays.  Currently not significantly symptomatic but she does use Allegra most days.

## 2017-06-09 NOTE — Addendum Note (Signed)
Addended by: Reynaldo MiniumWELCHEL, KATIE C on: 06/09/2017 05:03 PM   Modules accepted: Orders

## 2017-06-09 NOTE — Patient Instructions (Signed)
Sample Stiolto Respimat-    Inhale 2 puffs, once every day as a maintenance medicine to see if it helps your breathing. You can still use the red Proair rescue inhaler- 2 puffs up to every 6 hours, if needed.   Please call if we can help

## 2017-06-09 NOTE — Assessment & Plan Note (Signed)
Stable mixed COPD pattern.  I would like to let her try a maintenance inhaler. Plan-sample trial of Stiolto with education done.

## 2017-06-27 ENCOUNTER — Telehealth: Payer: Self-pay | Admitting: Internal Medicine

## 2017-06-27 MED ORDER — TIOTROPIUM BROMIDE-OLODATEROL 2.5-2.5 MCG/ACT IN AERS: 2.0000 | INHALATION_SPRAY | Freq: Every day | RESPIRATORY_TRACT | 5 refills | Status: DC

## 2017-06-27 NOTE — Telephone Encounter (Signed)
Spoke with pt and advised rx sent to pharmacy. Nothing further is needed.    Sample Stiolto Respimat-    Inhale 2 puffs, once every day as a maintenance medicine to see if it helps your breathing. You can still use the red Proair rescue inhaler- 2 puffs up to every 6 hours, if needed.   Please call if we can help

## 2017-07-01 ENCOUNTER — Other Ambulatory Visit: Payer: Self-pay | Admitting: Gynecology

## 2017-07-01 DIAGNOSIS — Z139 Encounter for screening, unspecified: Secondary | ICD-10-CM

## 2017-07-21 DIAGNOSIS — M1611 Unilateral primary osteoarthritis, right hip: Secondary | ICD-10-CM | POA: Diagnosis present

## 2017-07-21 NOTE — H&P (Signed)
PREOPERATIVE H&P Patient ID: Bonnie Ellison MRN: [id number] DOB/AGE: [birth year] 71 y.o.  Chief Complaint: OA RIGHT HIP  Planned Procedure Date: 08/05/16  Medical and Cardiac Clearance by Dr. Modesto CharonWong pending  HPI: Bonnie BaltimoreGwendolyn E Witcher is a 72 y.o. female with a history of Asthma, GERD, and mild lumbar DJD who presents for evaluation of OA RIGHT HIP. The patient has a history of pain and functional disability in the right hip due to arthritis and has failed non-surgical conservative treatments for greater than 12 weeks to include NSAID's and/or analgesics and activity modification.  Onset of symptoms was gradual, starting 2 years ago with gradually worsening course since that time.  Patient currently rates pain at 8 out of 10 with activity. Patient has night pain, worsening of pain with activity and weight bearing and pain that interferes with activities of daily living.  Patient has evidence of subchondral sclerosis, periarticular osteophytes and joint space narrowing by imaging studies.  There is no active infection.  Past Medical History:  Diagnosis Date  . Acid reflux   . Allergic rhinitis, cause unspecified   . Celiac disease   . Chronic airway obstruction, not elsewhere classified   . GERD (gastroesophageal reflux disease)   . Hypertension   . Osteopenia 07/2015   T score -1.5 FRAX 4%/0.5%  . Other chronic sinusitis   . Other hyperalimentation   . Unspecified asthma(493.90)   . Urticaria   . Vertigo    Past Surgical History:  Procedure Laterality Date  . ABDOMINAL HYSTERECTOMY  1991   ABDOMINAL HYSTERECTOMY  leiomyomata  . CATARACT EXTRACTION     RIGHT EYE  . CESAREAN SECTION    . CHOLECYSTECTOMY N/A 10/31/2014   Procedure: LAPAROSCOPIC CHOLECYSTECTOMY;  Surgeon: Emelia LoronMatthew Wakefield, MD;  [facility];  Service: General;  Laterality: N/A;  . EYE SURGERY     DETACHED RETINA RIGHT EYE...   . TOTAL ABDOMINAL HYSTERECTOMY W/ BILATERAL SALPINGOOPHORECTOMY     Allergies  Allergen  Reactions  . Shellfish Allergy Anaphylaxis and Other (See Comments)    throat swelling, hives  . Atorvastatin Other (See Comments)    Leg cramps  . Wheat Bran Itching   Prior to Admission medications   Medication Sig Start Date End Date Taking? Authorizing Provider  acetaminophen (TYLENOL) 325 MG tablet Take 650 mg by mouth every 6 (six) hours as needed for mild pain.    Yes [provider]  albuterol (PROAIR HFA) 108 (90 Base) MCG/ACT inhaler USE 2 PUFFS BY MOUTH EVERY 6 HOURS AS NEEDED FOR WHEEZING AND SHORTNESS OF BREATH 06/07/16  Yes Young, Clinton D, MD  amLODipine (NORVASC) 2.5 MG tablet Take 2.5 mg by mouth daily. 04/15/17  Yes [provider]  Cholecalciferol (VITAMIN D3) 2000 units capsule Take 2,000 Units by mouth daily.    Yes [provider]  fexofenadine (ALLEGRA) 180 MG tablet Take 180 mg by mouth daily.     Yes [provider]  fluticasone (FLONASE) 50 MCG/ACT nasal spray USE 2 SPRAYS IN EACH NOSTRIL TWICE A DAY Patient taking differently: Use 2 sprays in each nostril twice daily as needed for congestion 11/13/16  Yes Young, Clinton D, MD  irbesartan (AVAPRO) 150 MG tablet Take 150 mg by mouth daily. 03/13/15  Yes [provider]  KLOR-CON M20 20 MEQ tablet Take 20 mEq by mouth daily.  12/13/14  Yes [provider]  omeprazole (PRILOSEC) 40 MG capsule Take 40 mg by mouth daily as needed (for acid reflux or heartburn).  05/23/17  Yes [provider]  spironolactone (ALDACTONE) 25 MG tablet Take 25 mg by mouth daily. 12/29/14  Yes [provider]  Tiotropium Bromide-Olodaterol (STIOLTO RESPIMAT) 2.5-2.5 MCG/ACT AERS Inhale 2 puffs into the lungs daily. 06/27/17  Yes Young, Joni Fears D, MD  nystatin-triamcinolone ointment [facility]) Apply 1 application topically 2 (two) times daily. Patient not taking: Reported on 07/16/2017 08/12/16   Fontaine, Nadyne Coombes, MD   Social History   Socioeconomic History  . Marital status:  Married    Spouse name: Not on file  . Number of children: Not on file  . Years of education: Not on file  . Highest education level: Not on file  Social Needs  . Financial resource strain: Not on file  . Food insecurity - worry: Not on file  . Food insecurity - inability: Not on file  . Transportation needs - medical: Not on file  . Transportation needs - non-medical: Not on file  Occupational History  . Occupation: retired-clerical work  Tobacco Use  . Smoking status: Never Smoker  . Smokeless tobacco: Never Used  Substance and Sexual Activity  . Alcohol use: No    Alcohol/week: 0.0 oz  . Drug use: No  . Sexual activity: Yes    Birth control/protection: Surgical, Post-menopausal    Comment: HYST-1st intercourse 72 yo-Fewer than 5 partners  Other Topics Concern  . Not on file  Social History Narrative  . Not on file   Family History  Problem Relation Age of Onset  . Heart failure Mother   . Heart disease Mother   . Pancreatic cancer Father   . Cancer Father 31       PANCREATIC (DECEASED)  . Diabetes Brother        DEACESED.Marland Kitchen HAD A KIDNEY TRANSPLANT  . Stroke Brother 69       DECEASED  . Breast cancer Sister 23    ROS: Currently denies lightheadedness, dizziness, Fever, chills, CP, SOB.   No personal history of DVT, PE, MI, or CVA. No loose teeth or dentures All other systems have been reviewed and were otherwise currently negative with the exception of those mentioned in the HPI and as above.  Objective: Vitals: Ht: 5 feet 3 inches wt: 217 temp: 98.2 BP: 136/102 Pulse: 95 O2 98% on room air. Physical Exam: General: Alert, NAD.  Trendelenberg Gait  HEENT: EOMI, Good Neck Extension  Pulm: No increased work of breathing.  Clear B/L A/P w/o crackle or wheeze.  CV: RRR, No m/g/r appreciated  GI: soft, NT, ND Neuro: Neuro without gross focal deficit.  Sensation intact distally Skin: No lesions in the area of chief complaint MSK/Surgical Site: Right Hip Non tender  over greater trochanter.  Pain with passive ROM.  Positive Stinchfield.  5/5 strength.  NVI.  Sensation intact distally.  Imaging Review Plain radiographs demonstrate severe degenerative joint disease of the right hip.   Assessment: OA RIGHT HIP Principal Problem:   Primary osteoarthritis of right hip Active Problems:   Chronic obstructive asthma (HCC)   GERD   Hypokalemia   Essential hypertension   Plan: Plan for Procedure(s): RIGHT TOTAL HIP ARTHROPLASTY ANTERIOR APPROACH  The patient history, physical exam, clinical judgement of the provider and imaging are consistent with end stage degenerative joint disease and total joint arthroplasty is deemed medically necessary. The treatment options including medical management, injection therapy, and arthroplasty were discussed at length. The risks and benefits of Procedure(s): RIGHT TOTAL HIP ARTHROPLASTY ANTERIOR APPROACH were presented and reviewed.  The risks of nonoperative treatment, versus surgical intervention including but not limited to continued pain, aseptic loosening, stiffness, dislocation/subluxation, infection, bleeding, nerve injury, blood clots, cardiopulmonary complications, morbidity, mortality, among others were discussed. The patient verbalizes understanding and wishes to proceed with the plan.  Patient is being admitted for inpatient treatment for surgery, pain control, PT, OT, prophylactic antibiotics, VTE prophylaxis, progressive ambulation, ADL's and discharge planning.   Dental prophylaxis discussed and recommended for 2 years postoperatively.   The patient does meet the criteria for TXA which will be used perioperatively via IV.    ASA 325 mg will be used postoperatively for DVT prophylaxis in addition to SCDs, and early ambulation.  Sparing use of NSAIDs dt prior history of increased Cr w/ Mobic.  Patient will continue omeprazole for GERD / gastroprotection.  The patient is planning to be discharged home  with home health services [facility]) in care of her husband Leonette Most.  Severity of Illness: The appropriate patient status for this patient is OBSERVATION. Observation status is judged to be reasonable and necessary in order to provide the required intensity of service to ensure the patient's safety. The patient's presenting symptoms, physical exam findings, and initial radiographic and laboratory data in the context of their medical condition is felt to place them at decreased risk for further clinical deterioration. Furthermore, it is anticipated that the patient will be medically stable for discharge from the hospital within 2 midnights of admission. The following factors support the patient status of observation.    Albina Billet III, PA-C 07/21/2017 3:47 PM

## 2017-07-22 ENCOUNTER — Telehealth: Payer: Self-pay | Admitting: Internal Medicine

## 2017-07-22 NOTE — Telephone Encounter (Signed)
I told Dr Modesto CharonWong this patient has moderate chronic obstructive airways disease, with FEV1 about 65% predicted, and chronic sinus disease. She is not prone to acute exacerbations and is probably at her respiratory baseline for necessary surgery.

## 2017-07-22 NOTE — Telephone Encounter (Signed)
Pt returning call. Cb is [phone].  Dr. Modesto CharonWong Phone number [phone]. Dr. Modesto CharonWong Fax number [phone]

## 2017-07-22 NOTE — Telephone Encounter (Signed)
The number listed to call back is incorrect,  I called the pt and LM to call back to see if she knew what Dr. Modesto CharonWong needed.  Called Dr. Modesto CharonWong office but the phone rang and there was no answer.   I will await call back from pt.

## 2017-07-22 NOTE — Telephone Encounter (Signed)
I called Dr. Nash DimmerWong's office, the pt is having surgery on 08/05/2017. Dr. Modesto CharonWong called wanting to speak with Dr. Maple HudsonYoung about pt and her surgery. His cell phone number is [phone].   Spoke with Dr. Modesto CharonWong, the patient is having a total right hip surgery, and he would like to know if she is ok for surgery?  He is sending her to cardiology as well because she has a slight T wave but this may be related from her lungs. She is [facility] with [facility]. Please advise CY.   Pt recently had an office visit with you but did not mention the surgery. If she needs to come back in please let me know and I can schedule her.

## 2017-07-23 NOTE — Pre-Procedure Instructions (Signed)
Bonnie Ellison  07/23/2017      [id number] - [place], [place] - [address] AT [facility] [address] [place] [address] Phone: [phone] Fax: [phone]    Your procedure is scheduled on 08/05/2017.  Report to [facility] Admitting at 1000 A.M.  Call this number if you have problems the morning of surgery:  [phone]   Remember:  Do not eat food or drink liquids after midnight.   Continue all medications as directed by your physician except follow these medication instructions before surgery below   Take these medicines the morning of surgery with A SIP OF WATER: Acetaminophen (Tylenol) - if needed Albuterol (proair inhaler) - if needed -- bring with you to the hospital the day of surgery Amlodipine (Norvasc) Fexofenadine (Allegra) Fluticasone (Flonase) - if needed Omeprazole (prilosec) - if needed Tiotropium Bromide-Olodaterol (Stiolto Respimat)  7 days prior to surgery STOP taking any Aspirin (unless otherwise instructed by your surgeon), Aleve, Naproxen, Ibuprofen, Motrin, Advil, Goody's, BC's, all herbal medications, fish oil, and all vitamins - continue your potassium supplement.   Do not wear jewelry, make-up or nail polish.  Do not wear lotions, powders, or perfumes, or deodorant.  Do not shave 48 hours prior to surgery.    Do not bring valuables to the hospital.  [facility] is not responsible for any belongings or valuables.  Hearing aids, eyeglasses, contacts, dentures or bridgework may not be worn into surgery.  Leave your suitcase in the car.  After surgery it may be brought to your room.  For patients admitted to the hospital, discharge time will be determined by your treatment team.  Patients discharged the day of surgery will not be allowed to drive home.   Name and phone number of your driver:    Special instructions:   [place]- Preparing For Surgery  Before surgery, you can  play an important role. Because skin is not sterile, your skin needs to be as free of germs as possible. You can reduce the number of germs on your skin by washing with CHG (chlorahexidine gluconate) Soap before surgery.  CHG is an antiseptic cleaner which kills germs and bonds with the skin to continue killing germs even after washing.  Please do not use if you have an allergy to CHG or antibacterial soaps. If your skin becomes reddened/irritated stop using the CHG.  Do not shave (including legs and underarms) for at least 48 hours prior to first CHG shower. It is OK to shave your face.  Please follow these instructions carefully.   1. Shower the NIGHT BEFORE SURGERY and the MORNING OF SURGERY with CHG.   2. If you chose to wash your hair, wash your hair first as usual with your normal shampoo.  3. After you shampoo, rinse your hair and body thoroughly to remove the shampoo.  4. Use CHG as you would any other liquid soap. You can apply CHG directly to the skin and wash gently with a scrungie or a clean washcloth.   5. Apply the CHG Soap to your body ONLY FROM THE NECK DOWN.  Do not use on open wounds or open sores. Avoid contact with your eyes, ears, mouth and genitals (private parts). Wash Face and genitals (private parts)  with your normal soap.  6. Wash thoroughly, paying special attention to the area where your surgery will be performed.  7. Thoroughly rinse your body with warm water from the  neck down.  8. DO NOT shower/wash with your normal soap after using and rinsing off the CHG Soap.  9. Pat yourself dry with a CLEAN TOWEL.  10. Wear CLEAN PAJAMAS to bed the night before surgery, wear comfortable clothes the morning of surgery  11. Place CLEAN SHEETS on your bed the night of your first shower and DO NOT SLEEP WITH PETS.    Day of Surgery: Shower as stated above. Do not apply any deodorants/lotions.  Please wear clean clothes to the hospital/surgery center.      Please  read over the following fact sheets that you were given. Pain Booklet, Coughing and Deep Breathing, Total Joint Packet, MRSA Information and Surgical Site Infection Prevention and Incentive Spirometry.

## 2017-07-24 ENCOUNTER — Other Ambulatory Visit: Payer: Self-pay

## 2017-07-24 ENCOUNTER — Encounter (HOSPITAL_COMMUNITY)
Admission: RE | Admit: 2017-07-24 | Discharge: 2017-07-24 | Disposition: A | Discharge status: Home / Self Care | Payer: Medicare Other | Source: Ambulatory Visit | Attending: Orthopedic Surgery | Admitting: Orthopedic Surgery

## 2017-07-24 ENCOUNTER — Encounter (HOSPITAL_COMMUNITY): Payer: Self-pay

## 2017-07-24 DIAGNOSIS — J4521 Mild intermittent asthma with (acute) exacerbation: Secondary | ICD-10-CM | POA: Diagnosis not present

## 2017-07-24 DIAGNOSIS — Z01818 Encounter for other preprocedural examination: Secondary | ICD-10-CM | POA: Insufficient documentation

## 2017-07-24 HISTORY — DX: Unspecified osteoarthritis, unspecified site: M19.90

## 2017-07-24 HISTORY — DX: Unspecified asthma, uncomplicated: J45.909

## 2017-07-24 HISTORY — DX: Family history of other specified conditions: Z84.89

## 2017-07-24 LAB — BASIC METABOLIC PANEL
Anion gap: 8 (ref 5–15)
BUN: 10 mg/dL (ref 6–20)
CO2: 25 mmol/L (ref 22–32)
Calcium: 9.7 mg/dL (ref 8.9–10.3)
Chloride: 107 mmol/L (ref 101–111)
Creatinine, Ser: 0.73 mg/dL (ref 0.44–1.00)
GFR calc Af Amer: >60 mL/min (ref >60)
GFR calc non Af Amer: >60 mL/min (ref >60)
Glucose, Bld: 94 mg/dL (ref 65–99)
Potassium: 4 mmol/L (ref 3.5–5.1)
Sodium: 140 mmol/L (ref 135–145)

## 2017-07-24 LAB — CBC
HCT: 39.5 % (ref 36.0–46.0)
Hemoglobin: 12.5 g/dL (ref 12.0–15.0)
MCH: 27.7 pg (ref 26.0–34.0)
MCHC: 31.6 g/dL (ref 30.0–36.0)
MCV: 87.6 fL (ref 78.0–100.0)
Platelets: 211 10*3/uL (ref 150–400)
RBC: 4.51 MIL/uL (ref 3.87–5.11)
RDW: 13.8 % (ref 11.5–15.5)
WBC: 5.5 10*3/uL (ref 4.0–10.5)

## 2017-07-24 LAB — SURGICAL PCR SCREEN
MRSA, PCR: NEGATIVE
Staphylococcus aureus: NEGATIVE

## 2017-07-24 NOTE — Progress Notes (Signed)
PCP - Dr. Modesto CharonWong Cardiologist - Dr. Jacinto HalimGanji - this is new, PCP recently referred for surgery;  Patient's first office visit was today 07/24/17  Chest x-ray - n/a EKG - 07/24/17 - requested tracing from Dr. Verl DickerGanji's office Stress Test - patient has an appointment next week at Dr. Verl DickerGanji's office for this ECHO - patient denies Cardiac Cath - patient denies  Sleep Study - patient denies  Anesthesia review: yes, for newly established cardiologist, stress test and carotid dopplers to be done prior to surgery  Patient denies shortness of breath, fever, cough and chest pain at PAT appointment   Patient verbalized understanding of instructions that were given to them at the PAT appointment. Patient was also instructed that they will need to review over the PAT instructions again at home before surgery.

## 2017-07-25 ENCOUNTER — Encounter (HOSPITAL_COMMUNITY): Payer: Self-pay | Admitting: Emergency Medicine

## 2017-07-25 NOTE — Progress Notes (Signed)
Anesthesia Chart Review: Patient is a 72 year old female scheduled for right THA, anterior approach on 08/05/17 by Dr Margarita Ranaimothy Murphy.  History includes hypertension, GERD, reflux, vertigo, celiac disease, asthma, COPD, chronic sinusitis, arthritis, hysterectomy, cholecystectomy. Reports her brother is "slow to wake up" after anesthesia. BMI is consistent with obesity.  - PCP is Dr. Leodis SiasFrancis Wong. - Cardiologist is Dr. Yates DecampJay Ganji at [facility]. Patient seen on 07/24/17. Office note pending. Reportedly, she has a stress test and carotid Duplex scheduled there next week. - Pulmonologist is Dr. Jetty Duhamellinton Young. On 07/22/17 he wrote, "I told Dr Modesto CharonWong this patient has moderate chronic obstructive airways disease, with FEV1 about 65% predicted, and chronic sinus disease. She is not prone to acute exacerbations and is probably at her respiratory baseline for necessary surgery."  Meds include ProAir HFA, amlodipine, Allegra, Flonase, Avapro, KCl, Prilosec, Aldactone, Stiolto Respimat.  BP (!) 142/77   Pulse 92   Temp 36.7 C   Resp 20   Ht 5\' 2"  (1.575 m)   Wt 216 lb 4.8 oz (98.1 kg)   LMP 05/10/1990   SpO2 100%   BMI 39.56 kg/m   EKG 07/24/17 [facility]): Normal sinus rhythm.  Nonspecific T wave abnormality. Low voltage.  Spirometry 06/07/16: FVC 1.59 (70%), FEV1 1.15 (65%), FEF 25-75% 0.85 (51%).  Interpretation: Moderate obstructive airways disease.  Moderate restriction of exhaled volume.  Preoperative labs noted.  Will leave chart for follow-up additional records from [facility].  Velna Ochsllison Nathan Moctezuma, PA-C [facility] Short Stay Center/Anesthesiology Phone [phone] 07/25/2017 5:24 PM

## 2017-07-29 ENCOUNTER — Ambulatory Visit
Admission: RE | Admit: 2017-07-29 | Discharge: 2017-07-29 | Disposition: A | Discharge status: Home / Self Care | Payer: Medicare Other | Source: Ambulatory Visit | Attending: Gynecology | Admitting: Gynecology

## 2017-07-29 DIAGNOSIS — Z139 Encounter for screening, unspecified: Secondary | ICD-10-CM

## 2017-08-05 ENCOUNTER — Encounter (HOSPITAL_COMMUNITY): Admission: RE | Payer: Self-pay | Source: Ambulatory Visit

## 2017-08-05 ENCOUNTER — Inpatient Hospital Stay (HOSPITAL_COMMUNITY): Admission: RE | Admit: 2017-08-05 | Payer: Medicare Other | Source: Ambulatory Visit | Admitting: Orthopedic Surgery

## 2017-08-05 SURGERY — ARTHROPLASTY, HIP, TOTAL, ANTERIOR APPROACH
Anesthesia: Choice | Site: Hip | Laterality: Right

## 2017-08-19 ENCOUNTER — Encounter (HOSPITAL_COMMUNITY)
Admission: RE | Admit: 2017-08-19 | Discharge: 2017-08-19 | Disposition: A | Discharge status: Home / Self Care | Payer: Medicare Other | Source: Ambulatory Visit | Attending: Orthopedic Surgery | Admitting: Orthopedic Surgery

## 2017-08-19 ENCOUNTER — Other Ambulatory Visit: Payer: Self-pay

## 2017-08-19 ENCOUNTER — Encounter (HOSPITAL_COMMUNITY): Payer: Self-pay

## 2017-08-19 DIAGNOSIS — M1611 Unilateral primary osteoarthritis, right hip: Secondary | ICD-10-CM | POA: Insufficient documentation

## 2017-08-19 DIAGNOSIS — Z01818 Encounter for other preprocedural examination: Secondary | ICD-10-CM | POA: Diagnosis present

## 2017-08-19 HISTORY — DX: Unilateral primary osteoarthritis, unspecified hip: M16.10

## 2017-08-19 LAB — SURGICAL PCR SCREEN
MRSA, PCR: NEGATIVE
Staphylococcus aureus: POSITIVE — AB

## 2017-08-19 LAB — BASIC METABOLIC PANEL
Anion gap: 12 (ref 5–15)
BUN: 10 mg/dL (ref 6–20)
CO2: 24 mmol/L (ref 22–32)
Calcium: 9.5 mg/dL (ref 8.9–10.3)
Chloride: 105 mmol/L (ref 101–111)
Creatinine, Ser: 0.64 mg/dL (ref 0.44–1.00)
GFR calc Af Amer: >60 mL/min (ref >60)
GFR calc non Af Amer: >60 mL/min (ref >60)
Glucose, Bld: 97 mg/dL (ref 65–99)
Potassium: 3.8 mmol/L (ref 3.5–5.1)
Sodium: 141 mmol/L (ref 135–145)

## 2017-08-19 LAB — CBC
HCT: 39.3 % (ref 36.0–46.0)
Hemoglobin: 12.8 g/dL (ref 12.0–15.0)
MCH: 28.8 pg (ref 26.0–34.0)
MCHC: 32.6 g/dL (ref 30.0–36.0)
MCV: 88.3 fL (ref 78.0–100.0)
Platelets: 213 10*3/uL (ref 150–400)
RBC: 4.45 MIL/uL (ref 3.87–5.11)
RDW: 13.7 % (ref 11.5–15.5)
WBC: 6 10*3/uL (ref 4.0–10.5)

## 2017-08-19 NOTE — Progress Notes (Signed)
[facility], and a message left informing the pt of a Positive Staph result of the PCR screening.

## 2017-08-19 NOTE — Pre-Procedure Instructions (Signed)
Bonnie Ellison  Ellison      [id number] - [place], [place] - [address] AT [facility] [id number] [place] [id number] Phone: [phone] Fax: [phone]    Your procedure is scheduled on Tuesday, September 02, 2017  [facility] "A" at 1:00PM   Call this number if you have problems the morning of surgery:  [phone]   Remember:  Do not eat food or drink liquids after midnight.  Take these medicines the morning of surgery with A SIP OF WATER: AmLODipine (NORVASC) and Tiotropium Bromide-Olodaterol (STIOLTO RESPIMAT). If needed Omeprazole (PRILOSEC) for acid relief, Acetaminophen (TYLENOL) for pain,   Fluticasone (FLONASE) for congestion, and Albuterol Inhaler for cough or wheezing (Bring with you the day of surgery).  7 days before surgery (Feb. 12), stop taking all Aspirins, Vitamins, Fish oils, and Herbal medications. Also stop all NSAIDS i.e. Advil, Ibuprofen, Motrin, Aleve, Anaprox, Naproxen, BC and Goody Powders.   Do not wear jewelry, make-up or nail polish.  Do not wear lotions, powders, perfumes, or deodorant.  Do not shave 48 hours prior to surgery.    Do not bring valuables to the hospital.  [facility] is not responsible for any belongings or valuables.  Contacts, dentures or bridgework may not be worn into surgery.  Leave your suitcase in the car.  After surgery it may be brought to your room.  For patients admitted to the hospital, discharge time will be determined by your treatment team.  Patients discharged the day of surgery will not be allowed to drive home.   Special instructions:   [place]- Preparing For Surgery  Before surgery, you can play an important role. Because skin is not sterile, your skin needs to be as free of germs as possible. You can reduce the number of germs on your skin by washing with CHG (chlorahexidine gluconate) Soap before surgery.   CHG is an antiseptic cleaner which kills germs and bonds with the skin to continue killing germs even after washing.  Please do not use if you have an allergy to CHG or antibacterial soaps. If your skin becomes reddened/irritated stop using the CHG.  Do not shave (including legs and underarms) for at least 48 hours prior to first CHG shower. It is OK to shave your face.  Please follow these instructions carefully.   1. Shower the NIGHT BEFORE SURGERY and the MORNING OF SURGERY with CHG.   2. If you chose to wash your hair, wash your hair first as usual with your normal shampoo.  3. After you shampoo, rinse your hair and body thoroughly to remove the shampoo.  4. Use CHG as you would any other liquid soap. You can apply CHG directly to the skin and wash gently with a scrungie or a clean washcloth.   5. Apply the CHG Soap to your body ONLY FROM THE NECK DOWN.  Do not use on open wounds or open sores. Avoid contact with your eyes, ears, mouth and genitals (private parts). Wash Face and genitals (private parts)  with your normal soap.  6. Wash thoroughly, paying special attention to the area where your surgery will be performed.  7. Thoroughly rinse your body with warm water from the neck down.  8. DO NOT shower/wash with your normal soap after using and rinsing off the CHG Soap.  9. Pat yourself dry with a CLEAN TOWEL.  10. Wear CLEAN PAJAMAS to  bed the night before surgery, wear comfortable clothes the morning of surgery  11. Place CLEAN SHEETS on your bed the night of your first shower and DO NOT SLEEP WITH PETS.  Day of Surgery: Do not apply any deodorants/lotions. Please wear clean clothes to the hospital/surgery center.    Please read over the following fact sheets that you were given. Pain Booklet, Coughing and Deep Breathing, Total Joint Packet, MRSA Information and Surgical Site Infection Prevention

## 2017-08-19 NOTE — Progress Notes (Signed)
PCP - Dr. Leodis SiasFrancis Wong  Cardiologist - Dr. Jacinto HalimGanji  Chest x-ray - Denies  EKG - Requested  Stress Test - Requested  ECHO - Requested  Cardiac Cath - Denies  Sleep Study - No CPAP - None  LABS- 08/19/17: CBC, BMP   Anesthesia- Yes- Requested records  Pt denies having chest pain, sob, or fever at this time. All instructions explained to the pt, with a verbal understanding of the material. Pt agrees to go over the instructions while at home for a better understanding. The opportunity to ask questions was provided.

## 2017-08-20 NOTE — Progress Notes (Signed)
Anesthesia Chart Review: Patient is a 72 year old female scheduled for right THA, anterior approach on 08/1917 by Dr Margarita Ranaimothy Murphy. Surgery was initially scheduled for 08/05/17, but was postponed until she could complete cardiac testing. Since then she has had a low risk stress test.   History includes never smoker, hypertension, GERD, vertigo, celiac disease, asthma, COPD, chronic sinusitis, osteoarthritis, hysterectomy, cholecystectomy. Reports her brother is "slow to wake up" after anesthesia. BMI is consistent with obesity.  - PCP is Dr. Leodis SiasFrancis Wong. - Cardiologist is Dr. Yates DecampJay Ganji at [facility]. Patient seen on 07/24/17 for abnormal EKG with upcoming orthopedic surgery. He ordered stress test, echo, and carotid Duplex. Stress test was low risk. No significant valvular abnormalities on echo.  - Pulmonologist is Dr. Jetty Duhamellinton Young. On 07/22/17 he wrote, "I told Dr Modesto CharonWong this patient has moderate chronic obstructive airways disease, with FEV1 about 65% predicted, and chronic sinus disease. She is not prone to acute exacerbations and is probably at her respiratory baseline for necessary surgery."  Meds include ProAir HFA, amlodipine, Allegra, Flonase, Avapro, KCl, Prilosec, Crestor, Aldactone, Stiolto Respimat.  BP (!) 123/59   Pulse 97   Temp 36.6 C   Resp 20   Ht 5\' 2"  (1.575 m)   Wt 216 lb 14.4 oz (98.4 kg)   LMP 05/10/1990   SpO2 99%   BMI 39.67 kg/m   EKG 07/24/17 [facility]): Normal sinus rhythm.  Nonspecific T wave abnormality. Low voltage.  Nuclear stress test 08/01/17 [facility]): Impression: 1. The resting electrocardiogram demonstrated normal sinus rhythm, LAD, LAFB, cannot rule out inferior infarct, old.  Normal resting conduction, no resting arrhythmias and nonspecific ST-T changes.  Stress EKG is nondiagnostic for ischemia as it is a pharmacologic stress using Lexiscan. 2.  The perfusion imaging study demonstrates soft tissue attenuation artifact in the  inferior wall.  There is no demonstratable ischemia or scar.  LV systolic function was normal at 57% without regional wall motion abnormality.  This is a low risk study.  Echo 07/30/17 [facility]): Conclusions: 1. Left ventricle cavity is normal in size.  Mild to moderate concentric hypertrophy of the left ventricle.  Normal global wall motion.  Doppler evidence of grade 1 (impaired) diastolic dysfunction.  Calculated EF 64%. 2.  Mild (grade 1) mitral regurgitation.  Mild calcification of the mitral valve annulus. 3.  Trace tricuspid regurgitation.  Carotid U/S 07/30/17 [facility]): Conclusions: Minimal stenosis in the right external carotid artery (less than 50%), may be source of bruit.  No significant carotid atherosclerotic plaque noted.  Antegrade right vertebral artery flow.  Antegrade left vertebral artery flow.  Follow-up in one year is appropriate if clinically indicated.  Spirometry 06/07/16: FVC 1.59 (70%), FEV1 1.15 (65%), FEF 25-75% 0.85 (51%). Interpretation: Moderate obstructive airways disease.  Moderate restriction of exhaled volume.  Preoperative labs noted. CBC and BMET WNL.   Patient with pulmonology input and recent cardiac testing. Based on available information, I would anticipate that she can proceed as planned if no acute changes.  Bonnie Ochsllison Nova Evett, PA-C [facility]/Anesthesiology Phone [phone] 08/20/2017 4:02 PM

## 2017-09-01 MED ORDER — SODIUM CHLORIDE 0.9 % IV SOLN
2000.0000 mg | INTRAVENOUS | Status: AC
  Administered 2017-09-02: 2000 mg via TOPICAL
  Filled 2017-09-01: qty 20

## 2017-09-01 MED ORDER — BUPIVACAINE LIPOSOME 1.3 % IJ SUSP
20.0000 mL | INTRAMUSCULAR | Status: AC
  Administered 2017-09-02: 20 mL
  Filled 2017-09-01: qty 20

## 2017-09-01 MED ORDER — TRANEXAMIC ACID 1000 MG/10ML IV SOLN
1000.0000 mg | INTRAVENOUS | Status: AC
  Administered 2017-09-02: 1000 mg via INTRAVENOUS
  Filled 2017-09-01: qty 1.1

## 2017-09-02 ENCOUNTER — Encounter (HOSPITAL_COMMUNITY): Payer: Self-pay

## 2017-09-02 ENCOUNTER — Inpatient Hospital Stay (HOSPITAL_COMMUNITY): Payer: Medicare Other | Admitting: Certified Registered Nurse Anesthetist

## 2017-09-02 ENCOUNTER — Inpatient Hospital Stay (HOSPITAL_COMMUNITY): Payer: Medicare Other

## 2017-09-02 ENCOUNTER — Other Ambulatory Visit: Payer: Self-pay

## 2017-09-02 ENCOUNTER — Encounter (HOSPITAL_COMMUNITY)
Admission: RE | Disposition: A | Discharge status: Home Health | Payer: Self-pay | Source: Ambulatory Visit | Attending: Orthopedic Surgery

## 2017-09-02 ENCOUNTER — Inpatient Hospital Stay (HOSPITAL_COMMUNITY)
Admission: RE | Admit: 2017-09-02 | Discharge: 2017-09-03 | DRG: 470 | Disposition: A | Discharge status: Home Health | Payer: Medicare Other | Source: Ambulatory Visit | Attending: Orthopedic Surgery | Admitting: Orthopedic Surgery

## 2017-09-02 ENCOUNTER — Inpatient Hospital Stay (HOSPITAL_COMMUNITY): Payer: Medicare Other | Admitting: Vascular Surgery

## 2017-09-02 DIAGNOSIS — Z79899 Other long term (current) drug therapy: Secondary | ICD-10-CM

## 2017-09-02 DIAGNOSIS — I10 Essential (primary) hypertension: Secondary | ICD-10-CM

## 2017-09-02 DIAGNOSIS — Z888 Allergy status to other drugs, medicaments and biological substances status: Secondary | ICD-10-CM

## 2017-09-02 DIAGNOSIS — M8580 Other specified disorders of bone density and structure, unspecified site: Secondary | ICD-10-CM | POA: Diagnosis present

## 2017-09-02 DIAGNOSIS — M1611 Unilateral primary osteoarthritis, right hip: Secondary | ICD-10-CM | POA: Diagnosis present

## 2017-09-02 DIAGNOSIS — J449 Chronic obstructive pulmonary disease, unspecified: Secondary | ICD-10-CM | POA: Diagnosis present

## 2017-09-02 DIAGNOSIS — Z91018 Allergy to other foods: Secondary | ICD-10-CM | POA: Diagnosis not present

## 2017-09-02 DIAGNOSIS — K219 Gastro-esophageal reflux disease without esophagitis: Secondary | ICD-10-CM

## 2017-09-02 DIAGNOSIS — Z91013 Allergy to seafood: Secondary | ICD-10-CM | POA: Diagnosis not present

## 2017-09-02 DIAGNOSIS — Z419 Encounter for procedure for purposes other than remedying health state, unspecified: Secondary | ICD-10-CM

## 2017-09-02 DIAGNOSIS — Z6839 Body mass index (BMI) 39.0-39.9, adult: Secondary | ICD-10-CM | POA: Diagnosis not present

## 2017-09-02 HISTORY — PX: TOTAL HIP ARTHROPLASTY: SHX124

## 2017-09-02 SURGERY — ARTHROPLASTY, HIP, TOTAL, ANTERIOR APPROACH
Anesthesia: Spinal | Site: Hip | Laterality: Right

## 2017-09-02 MED ORDER — DOCUSATE SODIUM 100 MG PO CAPS: 100.0000 mg | ORAL_CAPSULE | Freq: Two times a day (BID) | ORAL | 0 refills | Status: DC

## 2017-09-02 MED ORDER — CEFAZOLIN SODIUM-DEXTROSE 1-4 GM/50ML-% IV SOLN
1.0000 g | Freq: Four times a day (QID) | INTRAVENOUS | Status: AC
  Administered 2017-09-02 (×2): 1 g via INTRAVENOUS
  Filled 2017-09-02 (×2): qty 50

## 2017-09-02 MED ORDER — ROCURONIUM BROMIDE 10 MG/ML (PF) SYRINGE
PREFILLED_SYRINGE | INTRAVENOUS | Status: AC
  Filled 2017-09-02: qty 5

## 2017-09-02 MED ORDER — DEXAMETHASONE SODIUM PHOSPHATE 10 MG/ML IJ SOLN
INTRAMUSCULAR | Status: DC | PRN
  Administered 2017-09-02: 4 mg via INTRAVENOUS

## 2017-09-02 MED ORDER — PHENYLEPHRINE 40 MCG/ML (10ML) SYRINGE FOR IV PUSH (FOR BLOOD PRESSURE SUPPORT)
PREFILLED_SYRINGE | INTRAVENOUS | Status: AC
  Filled 2017-09-02: qty 10

## 2017-09-02 MED ORDER — ONDANSETRON HCL 4 MG/2ML IJ SOLN: 4.0000 mg | Freq: Four times a day (QID) | INTRAMUSCULAR | Status: DC | PRN

## 2017-09-02 MED ORDER — PHENYLEPHRINE 40 MCG/ML (10ML) SYRINGE FOR IV PUSH (FOR BLOOD PRESSURE SUPPORT)
PREFILLED_SYRINGE | INTRAVENOUS | Status: DC | PRN
  Administered 2017-09-02: 80 ug via INTRAVENOUS

## 2017-09-02 MED ORDER — METOCLOPRAMIDE HCL 5 MG PO TABS: 5.0000 mg | ORAL_TABLET | Freq: Three times a day (TID) | ORAL | Status: DC | PRN

## 2017-09-02 MED ORDER — ACETAMINOPHEN 325 MG PO TABS: 650.0000 mg | ORAL_TABLET | ORAL | Status: DC | PRN

## 2017-09-02 MED ORDER — ALBUTEROL SULFATE (2.5 MG/3ML) 0.083% IN NEBU: 2.5000 mg | INHALATION_SOLUTION | Freq: Four times a day (QID) | RESPIRATORY_TRACT | Status: DC | PRN

## 2017-09-02 MED ORDER — POLYETHYLENE GLYCOL 3350 17 G PO PACK: 17.0000 g | PACK | Freq: Every day | ORAL | Status: DC | PRN

## 2017-09-02 MED ORDER — LACTATED RINGERS IV SOLN
INTRAVENOUS | Status: DC
  Administered 2017-09-02 – 2017-09-03 (×2): via INTRAVENOUS

## 2017-09-02 MED ORDER — IRBESARTAN 150 MG PO TABS
150.0000 mg | ORAL_TABLET | Freq: Every day | ORAL | Status: DC
  Administered 2017-09-03: 150 mg via ORAL
  Filled 2017-09-02: qty 1

## 2017-09-02 MED ORDER — DOCUSATE SODIUM 100 MG PO CAPS
100.0000 mg | ORAL_CAPSULE | Freq: Two times a day (BID) | ORAL | Status: DC
  Administered 2017-09-02 – 2017-09-03 (×3): 100 mg via ORAL
  Filled 2017-09-02 (×3): qty 1

## 2017-09-02 MED ORDER — ROSUVASTATIN CALCIUM 10 MG PO TABS
10.0000 mg | ORAL_TABLET | Freq: Every day | ORAL | Status: DC
  Administered 2017-09-02: 10 mg via ORAL
  Filled 2017-09-02: qty 1

## 2017-09-02 MED ORDER — HYDROMORPHONE HCL 1 MG/ML IJ SOLN
0.2500 mg | INTRAMUSCULAR | Status: DC | PRN
  Administered 2017-09-02 (×2): 0.5 mg via INTRAVENOUS

## 2017-09-02 MED ORDER — ACETAMINOPHEN 650 MG RE SUPP: 650.0000 mg | RECTAL | Status: DC | PRN

## 2017-09-02 MED ORDER — PHENYLEPHRINE HCL 10 MG/ML IJ SOLN
INTRAMUSCULAR | Status: DC | PRN
  Administered 2017-09-02: 45 ug/min via INTRAVENOUS

## 2017-09-02 MED ORDER — ONDANSETRON HCL 4 MG/2ML IJ SOLN
INTRAMUSCULAR | Status: DC | PRN
  Administered 2017-09-02: 4 mg via INTRAVENOUS

## 2017-09-02 MED ORDER — PROPOFOL 10 MG/ML IV BOLUS
INTRAVENOUS | Status: AC
  Filled 2017-09-02: qty 20

## 2017-09-02 MED ORDER — ACETAMINOPHEN 500 MG PO TABS
1000.0000 mg | ORAL_TABLET | Freq: Once | ORAL | Status: AC
  Administered 2017-09-02: 1000 mg via ORAL
  Filled 2017-09-02: qty 2

## 2017-09-02 MED ORDER — METHOCARBAMOL 500 MG PO TABS
500.0000 mg | ORAL_TABLET | Freq: Four times a day (QID) | ORAL | Status: DC | PRN
  Administered 2017-09-02 – 2017-09-03 (×2): 500 mg via ORAL
  Filled 2017-09-02: qty 1

## 2017-09-02 MED ORDER — ASPIRIN EC 325 MG PO TBEC
325.0000 mg | DELAYED_RELEASE_TABLET | Freq: Every day | ORAL | Status: DC
  Administered 2017-09-03: 325 mg via ORAL
  Filled 2017-09-02: qty 1

## 2017-09-02 MED ORDER — HYDROCODONE-ACETAMINOPHEN 5-325 MG PO TABS
2.0000 | ORAL_TABLET | ORAL | Status: DC | PRN
  Administered 2017-09-03 (×2): 2 via ORAL
  Filled 2017-09-02: qty 2

## 2017-09-02 MED ORDER — HYDROMORPHONE HCL 1 MG/ML IJ SOLN
INTRAMUSCULAR | Status: AC
  Administered 2017-09-02: 0.5 mg via INTRAVENOUS
  Filled 2017-09-02: qty 1

## 2017-09-02 MED ORDER — BUPIVACAINE IN DEXTROSE 0.75-8.25 % IT SOLN
INTRATHECAL | Status: DC | PRN
Start: 2017-09-02 — End: 2017-09-02
  Administered 2017-09-02: 1.8 mL via INTRATHECAL

## 2017-09-02 MED ORDER — PANTOPRAZOLE SODIUM 40 MG PO TBEC
40.0000 mg | DELAYED_RELEASE_TABLET | Freq: Every day | ORAL | Status: DC
  Administered 2017-09-03: 40 mg via ORAL
  Filled 2017-09-02: qty 1

## 2017-09-02 MED ORDER — ONDANSETRON HCL 4 MG PO TABS
4.0000 mg | ORAL_TABLET | Freq: Four times a day (QID) | ORAL | Status: DC | PRN
Start: 2017-09-02 — End: 2017-09-03

## 2017-09-02 MED ORDER — HYDROCODONE-ACETAMINOPHEN 5-325 MG PO TABS: 1.0000 | ORAL_TABLET | ORAL | 0 refills | Status: DC | PRN

## 2017-09-02 MED ORDER — AMLODIPINE BESYLATE 2.5 MG PO TABS
2.5000 mg | ORAL_TABLET | Freq: Every day | ORAL | Status: DC
  Administered 2017-09-03: 2.5 mg via ORAL
  Filled 2017-09-02: qty 1

## 2017-09-02 MED ORDER — CEFAZOLIN SODIUM-DEXTROSE 2-4 GM/100ML-% IV SOLN
2.0000 g | INTRAVENOUS | Status: AC
  Administered 2017-09-02: 2 g via INTRAVENOUS
  Filled 2017-09-02: qty 100

## 2017-09-02 MED ORDER — GABAPENTIN 300 MG PO CAPS
300.0000 mg | ORAL_CAPSULE | Freq: Once | ORAL | Status: AC
  Administered 2017-09-02: 300 mg via ORAL
  Filled 2017-09-02: qty 1

## 2017-09-02 MED ORDER — 0.9 % SODIUM CHLORIDE (POUR BTL) OPTIME
TOPICAL | Status: DC | PRN
  Administered 2017-09-02: 1000 mL

## 2017-09-02 MED ORDER — KETOROLAC TROMETHAMINE 15 MG/ML IJ SOLN
7.5000 mg | Freq: Four times a day (QID) | INTRAMUSCULAR | Status: AC
  Administered 2017-09-02 – 2017-09-03 (×4): 7.5 mg via INTRAVENOUS
  Filled 2017-09-02 (×4): qty 1

## 2017-09-02 MED ORDER — CHLORHEXIDINE GLUCONATE 4 % EX LIQD: 60.0000 mL | Freq: Once | CUTANEOUS | Status: DC

## 2017-09-02 MED ORDER — LACTATED RINGERS IV SOLN
INTRAVENOUS | Status: DC
  Administered 2017-09-02: 07:00:00 via INTRAVENOUS

## 2017-09-02 MED ORDER — DEXAMETHASONE SODIUM PHOSPHATE 10 MG/ML IJ SOLN
10.0000 mg | Freq: Once | INTRAMUSCULAR | Status: AC
  Administered 2017-09-03: 10 mg via INTRAVENOUS
  Filled 2017-09-02: qty 1

## 2017-09-02 MED ORDER — ASPIRIN EC 325 MG PO TBEC: 325.0000 mg | DELAYED_RELEASE_TABLET | Freq: Every day | ORAL | 0 refills | Status: DC

## 2017-09-02 MED ORDER — MIDAZOLAM HCL 2 MG/2ML IJ SOLN
INTRAMUSCULAR | Status: AC
  Filled 2017-09-02: qty 2

## 2017-09-02 MED ORDER — FENTANYL CITRATE (PF) 100 MCG/2ML IJ SOLN
INTRAMUSCULAR | Status: DC | PRN
  Administered 2017-09-02 (×2): 50 ug via INTRAVENOUS

## 2017-09-02 MED ORDER — SENNA 8.6 MG PO TABS
1.0000 | ORAL_TABLET | Freq: Two times a day (BID) | ORAL | Status: DC
  Administered 2017-09-02: 8.6 mg via ORAL
  Filled 2017-09-02 (×2): qty 1

## 2017-09-02 MED ORDER — DEXTROSE 5 % IV SOLN
500.0000 mg | Freq: Four times a day (QID) | INTRAVENOUS | Status: DC | PRN
  Filled 2017-09-02: qty 5

## 2017-09-02 MED ORDER — EPHEDRINE 5 MG/ML INJ
INTRAVENOUS | Status: AC
  Filled 2017-09-02: qty 10

## 2017-09-02 MED ORDER — FENTANYL CITRATE (PF) 250 MCG/5ML IJ SOLN
INTRAMUSCULAR | Status: AC
  Filled 2017-09-02: qty 5

## 2017-09-02 MED ORDER — PHENOL 1.4 % MT LIQD: 1.0000 | OROMUCOSAL | Status: DC | PRN

## 2017-09-02 MED ORDER — LIDOCAINE 2% (20 MG/ML) 5 ML SYRINGE
INTRAMUSCULAR | Status: AC
  Filled 2017-09-02: qty 5

## 2017-09-02 MED ORDER — DIPHENHYDRAMINE HCL 12.5 MG/5ML PO ELIX: 12.5000 mg | ORAL_SOLUTION | ORAL | Status: DC | PRN

## 2017-09-02 MED ORDER — SPIRONOLACTONE 25 MG PO TABS
25.0000 mg | ORAL_TABLET | Freq: Every day | ORAL | Status: DC
  Administered 2017-09-02 – 2017-09-03 (×2): 25 mg via ORAL
  Filled 2017-09-02 (×2): qty 1

## 2017-09-02 MED ORDER — MENTHOL 3 MG MT LOZG: 1.0000 | LOZENGE | OROMUCOSAL | Status: DC | PRN

## 2017-09-02 MED ORDER — ONDANSETRON HCL 4 MG/2ML IJ SOLN
INTRAMUSCULAR | Status: AC
  Filled 2017-09-02: qty 2

## 2017-09-02 MED ORDER — SORBITOL 70 % SOLN: 30.0000 mL | Freq: Every day | Status: DC | PRN

## 2017-09-02 MED ORDER — DEXAMETHASONE SODIUM PHOSPHATE 10 MG/ML IJ SOLN
INTRAMUSCULAR | Status: AC
  Filled 2017-09-02: qty 1

## 2017-09-02 MED ORDER — HYDROCODONE-ACETAMINOPHEN 5-325 MG PO TABS
1.0000 | ORAL_TABLET | ORAL | Status: DC | PRN
  Administered 2017-09-02: 1 via ORAL
  Filled 2017-09-02 (×2): qty 1

## 2017-09-02 MED ORDER — FLEET ENEMA 7-19 GM/118ML RE ENEM: 1.0000 | ENEMA | Freq: Once | RECTAL | Status: DC | PRN

## 2017-09-02 MED ORDER — HYDROMORPHONE HCL 1 MG/ML IJ SOLN
0.5000 mg | INTRAMUSCULAR | Status: DC | PRN
  Administered 2017-09-02: 0.5 mg via INTRAVENOUS
  Filled 2017-09-02: qty 1

## 2017-09-02 MED ORDER — POTASSIUM CHLORIDE CRYS ER 20 MEQ PO TBCR
20.0000 meq | EXTENDED_RELEASE_TABLET | Freq: Every day | ORAL | Status: DC
  Administered 2017-09-02 – 2017-09-03 (×2): 20 meq via ORAL
  Filled 2017-09-02 (×2): qty 1

## 2017-09-02 MED ORDER — PROPOFOL 500 MG/50ML IV EMUL
INTRAVENOUS | Status: DC | PRN
  Administered 2017-09-02: 50 ug/kg/min via INTRAVENOUS

## 2017-09-02 MED ORDER — LORATADINE 10 MG PO TABS
10.0000 mg | ORAL_TABLET | Freq: Every day | ORAL | Status: DC
  Administered 2017-09-02: 10 mg via ORAL
  Filled 2017-09-02: qty 1

## 2017-09-02 MED ORDER — PROMETHAZINE HCL 25 MG/ML IJ SOLN: 6.2500 mg | INTRAMUSCULAR | Status: DC | PRN

## 2017-09-02 MED ORDER — ONDANSETRON HCL 4 MG PO TABS
4.0000 mg | ORAL_TABLET | Freq: Three times a day (TID) | ORAL | 0 refills | Status: DC | PRN
Start: 2017-09-02 — End: 2018-01-23

## 2017-09-02 MED ORDER — METOCLOPRAMIDE HCL 5 MG/ML IJ SOLN: 5.0000 mg | Freq: Three times a day (TID) | INTRAMUSCULAR | Status: DC | PRN

## 2017-09-02 MED ORDER — BACLOFEN 10 MG PO TABS: 10.0000 mg | ORAL_TABLET | Freq: Three times a day (TID) | ORAL | 0 refills | Status: DC | PRN

## 2017-09-02 MED ORDER — METHOCARBAMOL 500 MG PO TABS
ORAL_TABLET | ORAL | Status: AC
  Filled 2017-09-02: qty 1

## 2017-09-02 MED ORDER — HYDROCODONE-ACETAMINOPHEN 5-325 MG PO TABS
ORAL_TABLET | ORAL | Status: AC
  Filled 2017-09-02: qty 1

## 2017-09-02 SURGICAL SUPPLY — 49 items
BAG DECANTER FOR FLEXI CONT (MISCELLANEOUS) IMPLANT
BLADE SAG 18X100X1.27 (BLADE) IMPLANT
CAPT HIP TOTAL 3 ×1 IMPLANT
CLSR STERI-STRIP ANTIMIC 1/2X4 (GAUZE/BANDAGES/DRESSINGS) ×2 IMPLANT
COVER PERINEAL POST (MISCELLANEOUS) ×2 IMPLANT
COVER SURGICAL LIGHT HANDLE (MISCELLANEOUS) ×2 IMPLANT
DRAPE C-ARM 42X72 X-RAY (DRAPES) ×2 IMPLANT
DRAPE STERI IOBAN 125X83 (DRAPES) ×2 IMPLANT
DRAPE U-SHAPE 47X51 STRL (DRAPES) IMPLANT
DRSG MEPILEX BORDER 4X8 (GAUZE/BANDAGES/DRESSINGS) ×2 IMPLANT
DURAPREP 26ML APPLICATOR (WOUND CARE) ×2 IMPLANT
ELECT BLADE 4.0 EZ CLEAN MEGAD (MISCELLANEOUS) ×2
ELECT REM PT RETURN 9FT ADLT (ELECTROSURGICAL) ×2
ELECTRODE BLDE 4.0 EZ CLN MEGD (MISCELLANEOUS) ×1 IMPLANT
ELECTRODE REM PT RTRN 9FT ADLT (ELECTROSURGICAL) ×1 IMPLANT
FACESHIELD WRAPAROUND (MASK) ×4 IMPLANT
FACESHIELD WRAPAROUND OR TEAM (MASK) ×2 IMPLANT
GLOVE BIO SURGEON STRL SZ7.5 (GLOVE) ×4 IMPLANT
GLOVE BIOGEL PI IND STRL 8 (GLOVE) ×2 IMPLANT
GLOVE BIOGEL PI INDICATOR 8 (GLOVE) ×2
GOWN STRL REUS W/ TWL LRG LVL3 (GOWN DISPOSABLE) ×2 IMPLANT
GOWN STRL REUS W/TWL LRG LVL3 (GOWN DISPOSABLE) ×4
KIT BASIN OR (CUSTOM PROCEDURE TRAY) ×2 IMPLANT
KIT ROOM TURNOVER OR (KITS) ×2 IMPLANT
MANIFOLD NEPTUNE II (INSTRUMENTS) ×2 IMPLANT
NDL SAFETY ECLIPSE 18X1.5 (NEEDLE) IMPLANT
NEEDLE HYPO 18GX1.5 SHARP (NEEDLE)
NEEDLE HYPO 22GX1.5 SAFETY (NEEDLE) ×2 IMPLANT
NS IRRIG 1000ML POUR BTL (IV SOLUTION) ×2 IMPLANT
PACK TOTAL JOINT (CUSTOM PROCEDURE TRAY) ×2 IMPLANT
PAD ARMBOARD 7.5X6 YLW CONV (MISCELLANEOUS) ×2 IMPLANT
SPONGE LAP 18X18 X RAY DECT (DISPOSABLE) IMPLANT
STRIP CLOSURE SKIN 1/2X4 (GAUZE/BANDAGES/DRESSINGS) ×1 IMPLANT
SUT MNCRL AB 4-0 PS2 18 (SUTURE) ×2 IMPLANT
SUT MON AB 2-0 CT1 36 (SUTURE) ×2 IMPLANT
SUT VIC AB 0 CT1 27 (SUTURE) ×2
SUT VIC AB 0 CT1 27XBRD ANBCTR (SUTURE) ×1 IMPLANT
SUT VIC AB 1 CT1 27 (SUTURE) ×2
SUT VIC AB 1 CT1 27XBRD ANBCTR (SUTURE) ×1 IMPLANT
SUT VLOC 180 0 24IN GS25 (SUTURE) IMPLANT
SYR 50ML LL SCALE MARK (SYRINGE) ×2 IMPLANT
SYR BULB IRRIGATION 50ML (SYRINGE) ×2 IMPLANT
SYRINGE 20CC LL (MISCELLANEOUS) IMPLANT
TOWEL OR 17X24 6PK STRL BLUE (TOWEL DISPOSABLE) ×2 IMPLANT
TOWEL OR 17X26 10 PK STRL BLUE (TOWEL DISPOSABLE) ×2 IMPLANT
TRAY CATH 16FR W/PLASTIC CATH (SET/KITS/TRAYS/PACK) IMPLANT
TRAY FOLEY W/METER SILVER 16FR (SET/KITS/TRAYS/PACK) IMPLANT
WATER STERILE IRR 1000ML POUR (IV SOLUTION) ×2 IMPLANT
YANKAUER SUCT BULB TIP NO VENT (SUCTIONS) ×4 IMPLANT

## 2017-09-02 NOTE — Anesthesia Postprocedure Evaluation (Signed)
Anesthesia Post Note  Patient: Bonnie Ellison  Procedure(s) Performed: TOTAL HIP ARTHROPLASTY ANTERIOR APPROACH (Right Hip)     Patient location during evaluation: PACU Anesthesia Type: Spinal Level of consciousness: oriented and awake and alert Pain management: pain level controlled Vital Signs Assessment: post-procedure vital signs reviewed and stable Respiratory status: spontaneous breathing, respiratory function stable and patient connected to nasal cannula oxygen Cardiovascular status: blood pressure returned to baseline and stable Postop Assessment: no headache, no backache and no apparent nausea or vomiting Anesthetic complications: no    Last Vitals:  Vitals:   09/02/17 1012 09/02/17 1027  BP: 129/76 130/78  Pulse: 81 86  Resp: 12 12  Temp:    SpO2: 96% 94%    Last Pain:  Vitals:   09/02/17 1039  TempSrc:   PainSc: Asleep    LLE Motor Response: Purposeful movement;Responds to commands (09/02/17 1027) LLE Sensation: Decreased(spinal block) (09/02/17 1027) RLE Motor Response: Purposeful movement;Responds to commands (09/02/17 1027) RLE Sensation: Decreased(spinal block) (09/02/17 1027) L Sensory Level: S1-Sole of foot, small toes (09/02/17 1027) R Sensory Level: S1-Sole of foot, small toes (09/02/17 1027)  Trelyn Vanderlinde S

## 2017-09-02 NOTE — Transfer of Care (Signed)
Immediate Anesthesia Transfer of Care Note  Patient: [name]  Procedure(s) Performed: TOTAL HIP ARTHROPLASTY ANTERIOR APPROACH (Right Hip)  Patient Location: PACU  Anesthesia Type:Spinal  Level of Consciousness: awake, alert , oriented and patient cooperative  Airway & Oxygen Therapy: Patient Spontanous Breathing  Post-op Assessment: Report given to RN, Post -op Vital signs reviewed and stable and Patient moving all extremities X 4  Post vital signs: Reviewed and stable  Last Vitals:  Vitals:   09/02/17 0537 09/02/17 0927  BP: (!) 164/80 131/80  Pulse: (!) 108 91  Resp: 20 19  Temp: 37.1 C (!) 36.2 C  SpO2: 100% 97%    Last Pain:  Vitals:   09/02/17 0927  TempSrc:   PainSc: (P) 0-No pain      Patients Stated Pain Goal: 3 (09/02/17 0606)  Complications: No apparent anesthesia complications

## 2017-09-02 NOTE — Op Note (Signed)
09/02/2017  9:08 AM  PATIENT:  Bonnie Ellison   MRN: [id number]  PRE-OPERATIVE DIAGNOSIS:  OA RIGHT HIP  POST-OPERATIVE DIAGNOSIS:  OA RIGHT HIP  PROCEDURE:  Procedure(s): TOTAL HIP ARTHROPLASTY ANTERIOR APPROACH  PREOPERATIVE INDICATIONS:    Bonnie Ellison is an 72 y.o. female who has a diagnosis of <principal problem not specified> and elected for surgical management after failing conservative treatment.  The risks benefits and alternatives were discussed with the patient including but not limited to the risks of nonoperative treatment, versus surgical intervention including infection, bleeding, nerve injury, periprosthetic fracture, the need for revision surgery, dislocation, leg length discrepancy, blood clots, cardiopulmonary complications, morbidity, mortality, among others, and they were willing to proceed.     OPERATIVE REPORT     SURGEON:   MURPHY, Ernesta Amble, MD    ASSISTANT:  Roxan Hockey, PA-C, he was present and scrubbed throughout the case, critical for completion in a timely fashion, and for retraction, instrumentation, and closure.     ANESTHESIA:  General    COMPLICATIONS:  None.     COMPONENTS:  Stryker acolade fit femur size 5 with a 36 mm -5 head ball and a PSL acetabular shell size 52 with a  polyethylene liner    PROCEDURE IN DETAIL:   The patient was met in the holding area and  identified.  The appropriate hip was identified and marked at the operative site.  The patient was then transported to the OR  and  placed under anesthesia per that record.  At that point, the patient was  placed in the supine position and  secured to the operating room table and all bony prominences padded. He received pre-operative antibiotics    The operative lower extremity was prepped from the iliac crest to the distal leg.  Sterile draping was performed.  Time out was performed prior to incision.      Skin incision was made just 2 cm lateral to the ASIS  extending in  line with the tensor fascia lata. Electrocautery was used to control all bleeders. I dissected down sharply to the fascia of the tensor fascia lata was confirmed that the muscle fibers beneath were running posteriorly. I then incised the fascia over the superficial tensor fascia lata in line with the incision. The fascia was elevated off the anterior aspect of the muscle the muscle was retracted posteriorly and protected throughout the case. I then used electrocautery to incise the tensor fascia lata fascia control and all bleeders. Immediately visible was the fat over top of the anterior neck and capsule.  I removed the anterior fat from the capsule and elevated the rectus muscle off of the anterior capsule. I then removed a large time of capsule. The retractors were then placed over the anterior acetabulum as well as around the superior and inferior neck.  I then removed a section of the femoral neck and a napkin ring fashion. Then used the power course to remove the femoral head from the acetabulum and thoroughly irrigated the acetabulum. I sized the femoral head.    I then exposed the deep acetabulum, cleared out any tissue including the ligamentum teres.   After adequate visualization, I excised the labrum, and then sequentially reamed.  I then impacted the acetabular implant into place using fluoroscopy for guidance.  Appropriate version and inclination was confirmed clinically matching their bony anatomy, and with fluoroscopy.  I placed a 20 mm screw in the posterior/superio position with an excellent bite.  I then placed the polyethylene liner in place  I then adducted the leg and released the external rotators from the posterior femur allowing it to be easily delivered up lateral and anterior to the acetabulum for preparation of the femoral canal.    I then prepared the proximal femur using the cookie-cutter and then sequentially reamed and broached.  A trial broach, neck, and head was  utilized, and I reduced the hip and used floroscopy to assess the neck length and femoral implant.  I then impacted the femoral prosthesis into place into the appropriate version. The hip was then reduced and fluoroscopy confirmed appropriate position. Leg lengths were restored.  I then irrigated the hip copiously again with, and repaired the fascia with Vicryl, followed by monocryl for the subcutaneous tissue, Monocryl for the skin, Steri-Strips and sterile gauze. The patient was then awakened and returned to PACU in stable and satisfactory condition. There were no complications.  POST OPERATIVE PLAN: WBAT, DVT px: SCD's/TED, ambulation and chemical dvt px  Timothy Murphy, MD Orthopedic Surgeon [phone]     

## 2017-09-02 NOTE — Discharge Instructions (Signed)

## 2017-09-02 NOTE — Evaluation (Signed)
Physical Therapy Evaluation Patient Details Name: Bonnie Ellison MRN: [id number] DOB: 1945-10-11 Today's Date: 09/02/2017   History of Present Illness  Pt is a 72 y/o female s/p elective R THA, direct anterior approach. PMH includes asthma, HTN, osteopenia, and COPD.   Clinical Impression  Pt is s/p surgery above with deficits below. Tolerated gait within the room well and required min to min guard A for mobility. HEP initiated, however, pt demonstrating limited ROM in R hip. Will continue to follow acutely to maximize functional mobility independence and safety.     Follow Up Recommendations Follow surgeon's recommendation for DC plan and follow-up therapies;Supervision for mobility/OOB    Equipment Recommendations  Rolling walker with 5" wheels;3in1 (PT)    Recommendations for Other Services       Precautions / Restrictions Precautions Precautions: None Precaution Comments: Reviewed supine ther ex with pt.  Restrictions Weight Bearing Restrictions: Yes RLE Weight Bearing: Weight bearing as tolerated      Mobility  Bed Mobility Overal bed mobility: Needs Assistance Bed Mobility: Supine to Sit     Supine to sit: Min assist     General bed mobility comments: Min A for RLE management and for trunk elevation. Required use of bed rails and elevated HOB.   Transfers Overall transfer level: Needs assistance Equipment used: Rolling walker (2 wheeled) Transfers: Sit to/from Stand Sit to Stand: Min assist         General transfer comment: Min A for lift assist and steadying. Verbal cues for safe hand placement.   Ambulation/Gait Ambulation/Gait assistance: Min guard Ambulation Distance (Feet): 25 Feet Assistive device: Rolling walker (2 wheeled) Gait Pattern/deviations: Step-to pattern;Decreased step length - right;Decreased step length - left;Decreased weight shift to right;Antalgic Gait velocity: Decreased  Gait velocity interpretation: Below normal speed for  age/gender General Gait Details: Slow, antalgic gait. Verbal cues for sequencing with RW.   Stairs            Wheelchair Mobility    Modified Rankin (Stroke Patients Only)       Balance Overall balance assessment: Needs assistance Sitting-balance support: No upper extremity supported;Feet supported Sitting balance-Leahy Scale: Good     Standing balance support: Bilateral upper extremity supported;During functional activity Standing balance-Leahy Scale: Poor Standing balance comment: Reliant on BUE support.                              Pertinent Vitals/Pain Pain Assessment: 0-10 Pain Score: 2  Pain Location: R hip  Pain Descriptors / Indicators: Aching;Operative site guarding Pain Intervention(s): Limited activity within patient's tolerance;Monitored during session;Repositioned    Home Living Family/patient expects to be discharged to:: Private residence Living Arrangements: Spouse/significant other Available Help at Discharge: Family;Available 24 hours/day Type of Home: House Home Access: Stairs to enter Entrance Stairs-Rails: None Entrance Stairs-Number of Steps: 3 Home Layout: One level Home Equipment: None      Prior Function Level of Independence: Independent               Hand Dominance   Dominant Hand: Right    Extremity/Trunk Assessment   Upper Extremity Assessment Upper Extremity Assessment: Defer to OT evaluation    Lower Extremity Assessment Lower Extremity Assessment: RLE deficits/detail RLE Deficits / Details: Sensory in tact. Deficits consistent with post op pain and weakness. Able to perform ther ex below.     Cervical / Trunk Assessment Cervical / Trunk Assessment: Normal  Communication   Communication: No  difficulties  Cognition Arousal/Alertness: Awake/alert Behavior During Therapy: WFL for tasks assessed/performed Overall Cognitive Status: Within Functional Limits for tasks assessed                                         General Comments General comments (skin integrity, edema, etc.): Pt's husband present during session     Exercises Total Joint Exercises Ankle Circles/Pumps: AROM;Both;20 reps Quad Sets: AROM;Right;10 reps Heel Slides: AROM;Right;10 reps(partial range )   Assessment/Plan    PT Assessment Patient needs continued PT services  PT Problem List Decreased strength;Decreased balance;Decreased range of motion;Decreased mobility;Decreased knowledge of use of DME;Decreased knowledge of precautions;Pain       PT Treatment Interventions Gait training;DME instruction;Stair training;Functional mobility training;Balance training;Therapeutic exercise;Therapeutic activities;Neuromuscular re-education;Patient/family education    PT Goals (Current goals can be found in the Care Plan section)  Acute Rehab PT Goals Patient Stated Goal: to go home  PT Goal Formulation: With patient Time For Goal Achievement: 09/16/17 Potential to Achieve Goals: Good    Frequency 7X/week   Barriers to discharge        Co-evaluation               AM-PAC PT "6 Clicks" Daily Activity  Outcome Measure Difficulty turning over in bed (including adjusting bedclothes, sheets and blankets)?: A Little Difficulty moving from lying on back to sitting on the side of the bed? : Unable Difficulty sitting down on and standing up from a chair with arms (e.g., wheelchair, bedside commode, etc,.)?: Unable Help needed moving to and from a bed to chair (including a wheelchair)?: A Little Help needed walking in hospital room?: A Little Help needed climbing 3-5 steps with a railing? : A Lot 6 Click Score: 13    End of Session Equipment Utilized During Treatment: Gait belt Activity Tolerance: Patient tolerated treatment well Patient left: in chair;with call bell/phone within reach;with family/visitor present Nurse Communication: Mobility status PT Visit Diagnosis: Other abnormalities of gait and  mobility (R26.89);Pain Pain - Right/Left: Right Pain - part of body: Hip    Time: 0981-1914 PT Time Calculation (min) (ACUTE ONLY): 27 min   Charges:   PT Evaluation $PT Eval Low Complexity: 1 Low PT Treatments $Therapeutic Activity: 8-22 mins   PT G Codes:        Gladys Damme, PT, DPT  Acute Rehabilitation Services  Pager: [phone]   Lehman Prom 09/02/2017, 4:26 PM

## 2017-09-02 NOTE — Interval H&P Note (Signed)
History and Physical Interval Note:  09/02/2017 6:13 AM  Bonnie Ellison  has presented today for surgery, with the diagnosis of OA RIGHT HIP  The various methods of treatment have been discussed with the patient and family. After consideration of risks, benefits and other options for treatment, the patient has consented to  Procedure(s): TOTAL HIP ARTHROPLASTY ANTERIOR APPROACH (Right) as a surgical intervention .  The patient's history has been reviewed, patient examined, no change in status, stable for surgery.  I have reviewed the patient's chart and labs.  Questions were answered to the patient's satisfaction.     MURPHY, TIMOTHY D

## 2017-09-02 NOTE — Care Management Note (Signed)
Case Management Note  Patient Details  Name: Bonnie Ellison MRN: [id number] Date of Birth: [birth year]  Subjective/Objective:  72 yr old female s/p right total hip arthroplasty.                  Action/Plan: Patient was preoperatively setup with [facility], no changes. She will have family support at discharge.   Expected Discharge Date:   09/03/17               Expected Discharge Plan:  Home w Home Health Services  In-House Referral:  NA  Discharge planning Services  CM Consult  Post Acute Care Choice:  Durable Medical Equipment, Home Health Choice offered to:  Patient  DME Arranged:  (S) 3-N-1, Walker rolling, CPM DME Agency:  [employer]  HH Arranged:  PT HH Agency:  [facility]  Status of Service:  Completed, signed off  If discussed at [employer] Meetings, dates discussed:    Additional Comments:  Bonnie Ellison, Bonnie Zahradnik Naomi, RN 09/02/2017, 3:41 PM

## 2017-09-02 NOTE — Anesthesia Preprocedure Evaluation (Signed)
Anesthesia Evaluation  Patient identified by MRN, date of birth, ID band Patient awake    Reviewed: Allergy & Precautions, NPO status , Patient's Chart, lab work & pertinent test results  Airway Mallampati: II  TM Distance: >3 FB Neck ROM: Full    Dental no notable dental hx.    Pulmonary neg pulmonary ROS,    Pulmonary exam normal breath sounds clear to auscultation       Cardiovascular hypertension, Normal cardiovascular exam Rhythm:Regular Rate:Normal     Neuro/Psych negative neurological ROS  negative psych ROS   GI/Hepatic Neg liver ROS, GERD  ,  Endo/Other  Morbid obesity  Renal/GU negative Renal ROS  negative genitourinary   Musculoskeletal negative musculoskeletal ROS (+)   Abdominal   Peds negative pediatric ROS (+)  Hematology negative hematology ROS (+)   Anesthesia Other Findings   Reproductive/Obstetrics negative OB ROS                             Anesthesia Physical Anesthesia Plan  ASA: III  Anesthesia Plan: Spinal   Post-op Pain Management:    Induction: Intravenous  PONV Risk Score and Plan: 2 and Ondansetron and Dexamethasone  Airway Management Planned: Simple Face Mask  Additional Equipment:   Intra-op Plan:   Post-operative Plan:   Informed Consent: I have reviewed the patients History and Physical, chart, labs and discussed the procedure including the risks, benefits and alternatives for the proposed anesthesia with the patient or authorized representative who has indicated his/her understanding and acceptance.   Dental advisory given  Plan Discussed with: CRNA and Surgeon  Anesthesia Plan Comments:         Anesthesia Quick Evaluation

## 2017-09-02 NOTE — Anesthesia Procedure Notes (Addendum)
Spinal  Patient location during procedure: OR Start time: 09/02/2017 7:12 AM End time: 09/02/2017 7:25 AM Staffing Anesthesiologist: Myrtie Soman, MD Performed: anesthesiologist  Preanesthetic Checklist Completed: patient identified, site marked, surgical consent, pre-op evaluation, timeout performed, IV checked, risks and benefits discussed and monitors and equipment checked Spinal Block Patient position: sitting Prep: Betadine Patient monitoring: heart rate, continuous pulse ox and blood pressure Injection technique: single-shot Needle Needle type: Sprotte  Needle gauge: 24 G Needle length: 9 cm Additional Notes Expiration date of kit checked and confirmed. Patient tolerated procedure well, without complications.

## 2017-09-02 NOTE — H&P (Signed)
ORTHOPAEDIC CONSULTATION  REQUESTING PHYSICIAN: Renette Butters, MD  Chief Complaint: R primary hip arthritis   HPI: Bonnie Ellison is a 72 y.o. female who complains of persistent R hip pain. She has failed nonoperative measures  Past Medical History:  Diagnosis Date  . Acid reflux   . Allergic rhinitis, cause unspecified   . Arthritis   . Asthma   . Celiac disease   . Chronic airway obstruction, not elsewhere classified   . Family history of adverse reaction to anesthesia    "patient's brother was 'slow' to wake up"  . GERD (gastroesophageal reflux disease)   . Hypertension   . Osteopenia 07/2015   T score -1.5 FRAX 4%/0.5%  . Other chronic sinusitis   . Other hyperalimentation   . Primary osteoarthritis of hip    Right  . Unspecified asthma(493.90)   . Urticaria   . Vertigo    Past Surgical History:  Procedure Laterality Date  . ABDOMINAL HYSTERECTOMY  1991   ABDOMINAL HYSTERECTOMY  leiomyomata  . CATARACT EXTRACTION     RIGHT EYE  . CESAREAN SECTION    . CHOLECYSTECTOMY N/A 10/31/2014   Procedure: LAPAROSCOPIC CHOLECYSTECTOMY;  Surgeon: Rolm Bookbinder, MD;  Location: [facility];  Service: General;  Laterality: N/A;  . DILATION AND CURETTAGE OF UTERUS    . EYE SURGERY     DETACHED RETINA RIGHT EYE...   . TOTAL ABDOMINAL HYSTERECTOMY W/ BILATERAL SALPINGOOPHORECTOMY     Social History   Socioeconomic History  . Marital status: Married    Spouse name: None  . Number of children: None  . Years of education: None  . Highest education level: None  Social Needs  . Financial resource strain: None  . Food insecurity - worry: None  . Food insecurity - inability: None  . Transportation needs - medical: None  . Transportation needs - non-medical: None  Occupational History  . Occupation: retired-clerical work  Tobacco Use  . Smoking status: Never Smoker  . Smokeless tobacco: Never Used  Substance and Sexual Activity  . Alcohol use: No   Alcohol/week: 0.0 oz  . Drug use: No  . Sexual activity: Yes    Birth control/protection: Surgical, Post-menopausal    Comment: HYST-1st intercourse 72 yo-Fewer than 5 partners  Other Topics Concern  . None  Social History Narrative  . None   Family History  Problem Relation Age of Onset  . Heart failure Mother   . Heart disease Mother   . Pancreatic cancer Father   . Cancer Father 40       PANCREATIC (DECEASED)  . Diabetes Brother        DEACESED.Marland Kitchen HAD A KIDNEY TRANSPLANT  . Stroke Brother 70       DECEASED  . Breast cancer Sister 33   Allergies  Allergen Reactions  . Shellfish Allergy Anaphylaxis and Other (See Comments)    throat swelling, hives  . Atorvastatin Other (See Comments)    Leg cramps  . Wheat Bran Itching   Prior to Admission medications   Medication Sig Start Date End Date Taking? Authorizing Provider  acetaminophen (TYLENOL) 325 MG tablet Take 650 mg by mouth every 6 (six) hours as needed for mild pain.    Yes [provider]  albuterol (PROAIR HFA) 108 (90 Base) MCG/ACT inhaler USE 2 PUFFS BY MOUTH EVERY 6 HOURS AS NEEDED FOR WHEEZING AND SHORTNESS OF BREATH 06/07/16  Yes Young, Clinton D, MD  amLODipine (NORVASC) 2.5 MG  tablet Take 2.5 mg by mouth daily. 04/15/17  Yes [provider]  Cholecalciferol (VITAMIN D3) 2000 units capsule Take 2,000 Units by mouth daily.    Yes [provider]  fexofenadine (ALLEGRA) 180 MG tablet Take 180 mg by mouth at bedtime.    Yes [provider]  fluticasone (FLONASE) 50 MCG/ACT nasal spray USE 2 SPRAYS IN EACH NOSTRIL TWICE A DAY Patient taking differently: Use 2 sprays in each nostril twice daily as needed for congestion 11/13/16  Yes Young, Clinton D, MD  irbesartan (AVAPRO) 150 MG tablet Take 150 mg by mouth daily. 03/13/15  Yes [provider]  KLOR-CON M20 20 MEQ tablet Take 20 mEq by mouth daily.  12/13/14  Yes [provider]  omeprazole (PRILOSEC) 40 MG capsule Take  40 mg by mouth daily as needed (for acid reflux or heartburn).  05/23/17  Yes [provider]  rosuvastatin (CRESTOR) 10 MG tablet Take 10 mg by mouth daily.   Yes [provider]  spironolactone (ALDACTONE) 25 MG tablet Take 25 mg by mouth daily. 12/29/14  Yes [provider]  Tiotropium Bromide-Olodaterol (STIOLTO RESPIMAT) 2.5-2.5 MCG/ACT AERS Inhale 2 puffs into the lungs daily. 06/27/17  Yes Baird Lyons D, MD   No results found.  Positive ROS: All other systems have been reviewed and were otherwise negative with the exception of those mentioned in the HPI and as above.  Labs cbc No results for input(s): WBC, HGB, HCT, PLT in the last 72 hours.  Labs inflam No results for input(s): CRP in the last 72 hours.  Invalid input(s): ESR  Labs coag No results for input(s): INR, PTT in the last 72 hours.  Invalid input(s): PT  No results for input(s): NA, K, CL, CO2, GLUCOSE, BUN, CREATININE, CALCIUM in the last 72 hours.  Physical Exam: Vitals:   09/02/17 0537  BP: (!) 164/80  Pulse: (!) 108  Resp: 20  Temp: 98.7 F (37.1 C)  SpO2: 100%   General: Alert, no acute distress Cardiovascular: No pedal edema Respiratory: No cyanosis, no use of accessory musculature GI: No organomegaly, abdomen is soft and non-tender Skin: No lesions in the area of chief complaint other than those listed below in MSK exam.  Neurologic: Sensation intact distally save for the below mentioned MSK exam Psychiatric: Patient is competent for consent with normal mood and affect Lymphatic: No axillary or cervical lymphadenopathy  MUSCULOSKELETAL:  NVI, Skin benign Other extremities are atraumatic with painless ROM and NVI.  Assessment: R hip OA  Plan: THA today   Renette Butters, MD Cell [phone]   09/02/2017 6:12 AM

## 2017-09-03 ENCOUNTER — Encounter (HOSPITAL_COMMUNITY): Payer: Self-pay | Admitting: Orthopedic Surgery

## 2017-09-03 NOTE — Progress Notes (Signed)
    Subjective: Patient reports pain as moderate.  Tolerating diet.  Urinating.  +Flatus.  No CP, SOB.  OOB with therapy and in room.  Objective:   VITALS:   Vitals:   09/02/17 1740 09/02/17 2007 09/03/17 0047 09/03/17 0537  BP: 125/71 131/69 128/71 (!) 111/56  Pulse: 86 83 93 87  Resp: 14 14 16 16   Temp:  97.7 F (36.5 C) 99.1 F (37.3 C) 98.4 F (36.9 C)  TempSrc:  Oral Oral Oral  SpO2: 98% 95% 95% 96%  Weight:      Height:       CBC Latest Ref Rng & Units 08/19/2017 07/24/2017 01/10/2015  WBC 4.0 - 10.5 K/uL 6.0 5.5 5.0  Hemoglobin 12.0 - 15.0 g/dL 16.112.8 09.612.5 11.6(L)  Hematocrit 36.0 - 46.0 % 39.3 39.5 35.3(L)  Platelets 150 - 400 K/uL 213 211 213.0   BMP Latest Ref Rng & Units 08/19/2017 07/24/2017 11/16/2014  Glucose 65 - 99 mg/dL 97 94 75  BUN 6 - 20 mg/dL 10 10 <0(A<5(L)  Creatinine 0.44 - 1.00 mg/dL 5.400.64 9.810.73 1.910.67  Sodium 135 - 145 mmol/L 141 140 137  Potassium 3.5 - 5.1 mmol/L 3.8 4.0 3.8  Chloride 101 - 111 mmol/L 105 107 105  CO2 22 - 32 mmol/L 24 25 23   Calcium 8.9 - 10.3 mg/dL 9.5 9.7 8.3(L)   Intake/Output      02/19 0701 - 02/20 0700   P.O. 240   I.V. (mL/kg) 790 (8.1)   Other 75   IV Piggyback 50   Total Intake(mL/kg) 1155 (11.8)   Urine (mL/kg/hr) 300 (0.1)   Blood 200   Total Output 500   Net +655       Urine Occurrence 2 x      Physical Exam: General: NAD.  Supine in bed.,  Calm, conversant.  No increased work of breathing  MSK Neurovascularly intact Sensation intact distally Feet warm Dorsiflexion/Plantar flexion intact Incision: dressing C/D/I   Assessment: 1 Day Post-Op  S/P Procedure(s) (LRB): TOTAL HIP ARTHROPLASTY ANTERIOR APPROACH (Right) by Dr. Jewel Baizeimothy D. Murphy on 09/02/2017  Active Problems:   Primary osteoarthritis of right hip   Primary osteoarthritis right hip, status post total hip arthroplasty Doing well postop day 1. Eating, drinking, voiding.  Early mobilization in room and with therapy. Pain  controlled  Plan: Advance diet Up with therapy D/C IV fluids Incentive Spirometry Apply ice as needed  Weight Bearing: Weight Bearing as Tolerated (WBAT)  Dressings: Mepilex.  VTE prophylaxis: Aspirin, SCDs, ambulation Dispo: Home later today after therapy sessions   Bonnie BilletHenry Calvin Martensen III, PA-C 09/03/2017, 6:54 AM

## 2017-09-03 NOTE — Discharge Summary (Signed)
Discharge Summary  Patient ID: Bonnie BaltimoreGwendolyn E Mccasland MRN: [id number] DOB/AGE: 1945/08/01 72 y.o.  Admit date: 09/02/2017 Discharge date: 09/03/2017  Admission Diagnoses:  Primary osteoarthritis of right hip  Discharge Diagnoses:  Principal Problem:   Primary osteoarthritis of right hip   Past Medical History:  Diagnosis Date  . Acid reflux   . Allergic rhinitis, cause unspecified   . Arthritis   . Asthma   . Celiac disease   . Chronic airway obstruction, not elsewhere classified   . Family history of adverse reaction to anesthesia    "patient's brother was 'slow' to wake up"  . GERD (gastroesophageal reflux disease)   . Hypertension   . Osteopenia 07/2015   T score -1.5 FRAX 4%/0.5%  . Other chronic sinusitis   . Other hyperalimentation   . Primary osteoarthritis of hip    Right  . Unspecified asthma(493.90)   . Urticaria   . Vertigo     Surgeries: Procedure(s): TOTAL HIP ARTHROPLASTY ANTERIOR APPROACH on 09/02/2017   Consultants (if any):   Discharged Condition: Improved  Hospital Course: Bonnie Ellison is an 72 y.o. female who was admitted 09/02/2017 with a diagnosis of Primary osteoarthritis of right hip and went to the operating room on 09/02/2017 and underwent the above named procedures.    She was given perioperative antibiotics:  Anti-infectives (From admission, onward)   Start     Dose/Rate Route Frequency Ordered Stop   09/02/17 1300  ceFAZolin (ANCEF) IVPB 1 g/50 mL premix     1 g 100 mL/hr over 30 Minutes Intravenous Every 6 hours 09/02/17 1127 09/02/17 2100   09/02/17 0549  ceFAZolin (ANCEF) IVPB 2g/100 mL premix     2 g 200 mL/hr over 30 Minutes Intravenous On call to O.R. 09/02/17 [id number] 09/02/17 0751    .  She was given sequential compression devices, early ambulation, and Aspirin for DVT prophylaxis.  She benefited maximally from the hospital stay and there were no complications.    Recent vital signs:  Vitals:   09/03/17 0047 09/03/17 0537   BP: 128/71 (!) 111/56  Pulse: 93 87  Resp: 16 16  Temp: 99.1 F (37.3 C) 98.4 F (36.9 C)  SpO2: 95% 96%    Recent laboratory studies:  Lab Results  Component Value Date   HGB 12.8 08/19/2017   HGB 12.5 07/24/2017   HGB 11.6 (L) 01/10/2015   Lab Results  Component Value Date   WBC 6.0 08/19/2017   PLT 213 08/19/2017   No results found for: INR Lab Results  Component Value Date   NA 141 08/19/2017   K 3.8 08/19/2017   CL 105 08/19/2017   CO2 24 08/19/2017   BUN 10 08/19/2017   CREATININE 0.64 08/19/2017   GLUCOSE 97 08/19/2017    Discharge Medications:   Allergies as of 09/03/2017      Reactions   Shellfish Allergy Anaphylaxis, Other (See Comments)   throat swelling, hives   Atorvastatin Other (See Comments)   Leg cramps   Wheat Bran Itching      Medication List    TAKE these medications   acetaminophen 325 MG tablet Commonly known as:  TYLENOL Take 650 mg by mouth every 6 (six) hours as needed for mild pain.   albuterol 108 (90 Base) MCG/ACT inhaler Commonly known as:  PROAIR HFA USE 2 PUFFS BY MOUTH EVERY 6 HOURS AS NEEDED FOR WHEEZING AND SHORTNESS OF BREATH   amLODipine 2.5 MG tablet Commonly known as:  NORVASC Take  2.5 mg by mouth daily.   aspirin EC 325 MG tablet Take 1 tablet (325 mg total) by mouth daily. For 30 days post op for DVT Prophylaxis   baclofen 10 MG tablet Commonly known as:  LIORESAL Take 1 tablet (10 mg total) by mouth 3 (three) times daily as needed for muscle spasms.   docusate sodium 100 MG capsule Commonly known as:  COLACE Take 1 capsule (100 mg total) by mouth 2 (two) times daily. To prevent constipation while taking pain medication.   fexofenadine 180 MG tablet Commonly known as:  ALLEGRA Take 180 mg by mouth at bedtime.   fluticasone 50 MCG/ACT nasal spray Commonly known as:  FLONASE USE 2 SPRAYS IN EACH NOSTRIL TWICE A DAY What changed:    how much to take  how to take this  when to take this    HYDROcodone-acetaminophen 5-325 MG tablet Commonly known as:  NORCO Take 1-2 tablets by mouth every 4 (four) hours as needed for moderate pain.   irbesartan 150 MG tablet Commonly known as:  AVAPRO Take 150 mg by mouth daily.   KLOR-CON M20 20 MEQ tablet Generic drug:  potassium chloride SA Take 20 mEq by mouth daily.   omeprazole 40 MG capsule Commonly known as:  PRILOSEC Take 40 mg by mouth daily as needed (for acid reflux or heartburn).   ondansetron 4 MG tablet Commonly known as:  ZOFRAN Take 1 tablet (4 mg total) by mouth every 8 (eight) hours as needed for nausea or vomiting.   rosuvastatin 10 MG tablet Commonly known as:  CRESTOR Take 10 mg by mouth daily.   spironolactone 25 MG tablet Commonly known as:  ALDACTONE Take 25 mg by mouth daily.   Tiotropium Bromide-Olodaterol 2.5-2.5 MCG/ACT Aers Commonly known as:  STIOLTO RESPIMAT Inhale 2 puffs into the lungs daily.   Vitamin D3 2000 units capsule Take 2,000 Units by mouth daily.       Diagnostic Studies: Dg C-arm 1-60 Min-no Report  Result Date: 09/02/2017 Fluoroscopy was utilized by the requesting physician.  No radiographic interpretation.   Dg Hip Operative Unilat W Or W/o Pelvis Right  Result Date: 09/02/2017 CLINICAL DATA:  Right hip replacement EXAM: OPERATIVE RIGHT HIP WITH PELVIS COMPARISON:  None. FLUOROSCOPY TIME:  Radiation Exposure Index (as provided by the fluoroscopic device): 2.77 mGy If the device does not provide the exposure index: Fluoroscopy Time:  19 seconds Number of Acquired Images:  2 FINDINGS: Right hip replacement is now seen in satisfactory position. No acute bony or soft tissue abnormality is noted. IMPRESSION: Status post right hip replacement Electronically Signed   By: Alcide Clever M.D.   On: 09/02/2017 09:03    Disposition: 01-Home or Self Care  Discharge Instructions    Discharge patient   Complete by:  As directed    Later today after therapy sessions   Discharge  disposition:  01-Home or Self Care   Discharge patient date:  09/03/2017      Follow-up Information    Sheral Apley, MD Follow up.   Specialty:  Orthopedic Surgery Contact information: [address] [place] [address] [phone]        [facility], [facility] Follow up.   Specialty:  Home Health Services Why:  A representative from [facility] will contact you to arrange start date and time for your therapy Contact information: [address] [place] [address] [phone]  Signed: Albina Billet III PA-C 09/03/2017, 7:00 AM

## 2017-09-03 NOTE — Evaluation (Signed)
Occupational Therapy Evaluation Patient Details Name: Bonnie Ellison MRN: [id number] DOB: [birth year] Today's Date: 09/03/2017    History of Present Illness Pt is a 72 y/o female s/p elective R THA, direct anterior approach. PMH includes asthma, HTN, osteopenia, and COPD.    Clinical Impression   Patient evaluated by Occupational Therapy with no further acute OT needs identified. All education has been completed and the patient has no further questions. See below for any follow-up Occupational Therapy or equipment needs. OT to sign off. Thank you for referral.      Follow Up Recommendations  No OT follow up    Equipment Recommendations  None recommended by OT    Recommendations for Other Services       Precautions / Restrictions Precautions Precautions: None Precaution Comments: reviewed standing HEP Restrictions Weight Bearing Restrictions: Yes RLE Weight Bearing: Weight bearing as tolerated      Mobility Bed Mobility Overal bed mobility: Modified Independent             General bed mobility comments: bed elevated to simulate home  Transfers Overall transfer level: Independent Equipment used: Rolling walker (2 wheeled) Transfers: Sit to/from Stand Sit to Stand: Min guard         General transfer comment: min guard for safety.     Balance Overall balance assessment: Needs assistance Sitting-balance support: No upper extremity supported;Feet supported Sitting balance-Leahy Scale: Good     Standing balance support: Bilateral upper extremity supported;During functional activity;No upper extremity supported Standing balance-Leahy Scale: Fair                             ADL either performed or assessed with clinical judgement   ADL Overall ADL's : Independent                                       General ADL Comments: able to dress for home , educated on shower transfer and demo bed mobility    Educated patient on knee  full extension with return demonstration, educated tub/shower transfer,never to wash directly on incision site, always use fresh clean linen (one time use then place in laundry), avoid water under bandage and benefits of wrapping dressing, sleeping positioning, avoid putting pillow under knee, educated on use of a belt or sheet as a leg lifter and  Car transfer.    Vision         Perception     Praxis      Pertinent Vitals/Pain Pain Assessment: 0-10 Pain Score: 3  Pain Location: R hip  Pain Descriptors / Indicators: Aching;Operative site guarding     Hand Dominance Right   Extremity/Trunk Assessment Upper Extremity Assessment Upper Extremity Assessment: Overall WFL for tasks assessed   Lower Extremity Assessment Lower Extremity Assessment: Defer to PT evaluation   Cervical / Trunk Assessment Cervical / Trunk Assessment: Normal   Communication Communication Communication: No difficulties   Cognition Arousal/Alertness: Awake/alert Behavior During Therapy: WFL for tasks assessed/performed Overall Cognitive Status: Within Functional Limits for tasks assessed                                     General Comments       Exercises Exercises: Total Joint Total Joint Exercises Heel Slides: (partial range )  Shoulder Instructions      Home Living Family/patient expects to be discharged to:: Private residence Living Arrangements: Spouse/significant other Available Help at Discharge: Family;Available 24 hours/day Type of Home: House Home Access: Stairs to enter Entergy CorporationEntrance Stairs-Number of Steps: 3 Entrance Stairs-Rails: None Home Layout: One level     Bathroom Shower/Tub: Producer, television/film/videoWalk-in shower   Bathroom Toilet: Standard     Home Equipment: None          Prior Functioning/Environment Level of Independence: Independent                 OT Problem List:        OT Treatment/Interventions:      OT Goals(Current goals can be found in the care plan  section) Acute Rehab OT Goals Patient Stated Goal: to go home   OT Frequency:     Barriers to D/C:            Co-evaluation              AM-PAC PT "6 Clicks" Daily Activity     Outcome Measure Help from another person eating meals?: None Help from another person taking care of personal grooming?: None Help from another person toileting, which includes using toliet, bedpan, or urinal?: None Help from another person bathing (including washing, rinsing, drying)?: None Help from another person to put on and taking off regular upper body clothing?: None Help from another person to put on and taking off regular lower body clothing?: None 6 Click Score: 24   End of Session Equipment Utilized During Treatment: Gait belt;Rolling walker Nurse Communication: Mobility status;Precautions  Activity Tolerance: Patient tolerated treatment well Patient left: in chair;with call bell/phone within reach                   Time: 1126-1144 OT Time Calculation (min): 18 min Charges:  OT General Charges $OT Visit: 1 Visit OT Evaluation $OT Eval Moderate Complexity: 1 Mod G-Codes:      Bonnie Ellison   OTR/L Pager: [phone] Office: [phone] .   Bonnie Ellison 09/03/2017, 4:06 PM

## 2017-09-03 NOTE — Progress Notes (Signed)
Physical Therapy Treatment Patient Details Name: Bonnie Ellison MRN: [id number] DOB: May 17, 1946 Today's Date: 09/03/2017    History of Present Illness Pt is a 72 y/o female s/p elective R THA, direct anterior approach. PMH includes asthma, HTN, osteopenia, and COPD.     PT Comments    Today's skilled session focused on progressing gait and stair negotiation. Pt is supervision with gait and min guard for stairs. She is expected to d/c this PM. Pt would benefit from continued skilled PT to increase functional independence and safety with mobility.    Follow Up Recommendations  Follow surgeon's recommendation for DC plan and follow-up therapies;Supervision for mobility/OOB     Equipment Recommendations  Rolling walker with 5" wheels;3in1 (PT)    Recommendations for Other Services       Precautions / Restrictions Precautions Precautions: None Restrictions Weight Bearing Restrictions: Yes RLE Weight Bearing: Weight bearing as tolerated    Mobility  Bed Mobility               General bed mobility comments: in chair on arrival  Transfers Overall transfer level: Needs assistance Equipment used: Rolling walker (2 wheeled) Transfers: Sit to/from Stand Sit to Stand: Min guard         General transfer comment: min guard for safety.   Ambulation/Gait Ambulation/Gait assistance: Supervision Ambulation Distance (Feet): 350 Feet Assistive device: Rolling walker (2 wheeled) Gait Pattern/deviations: Step-to pattern;Decreased step length - right;Decreased step length - left;Wide base of support   Gait velocity interpretation: at or above normal speed for age/gender General Gait Details: Pt with slightly antalgic gait this PM session secondary to post op pain. Cues for increased foot clearence.    Stairs Stairs: Yes   Stair Management: No rails;Step to pattern;Backwards;With walker Number of Stairs: 4 General stair comments: Min guard for safety with stairs and  cueing for technique.  Pt's husband present and assisting with bracing RW for stair negotiation.  Wheelchair Mobility    Modified Rankin (Stroke Patients Only)       Balance Overall balance assessment: Needs assistance Sitting-balance support: No upper extremity supported;Feet supported Sitting balance-Leahy Scale: Good     Standing balance support: Bilateral upper extremity supported;During functional activity;No upper extremity supported Standing balance-Leahy Scale: Fair                              Cognition Arousal/Alertness: Awake/alert Behavior During Therapy: WFL for tasks assessed/performed Overall Cognitive Status: Within Functional Limits for tasks assessed                                        Exercises      General Comments        Pertinent Vitals/Pain Pain Assessment: 0-10 Pain Score: 3  Pain Location: R hip  Pain Descriptors / Indicators: Aching;Operative site guarding    Home Living                      Prior Function            PT Goals (current goals can now be found in the care plan section) Acute Rehab PT Goals Patient Stated Goal: to go home  PT Goal Formulation: With patient Time For Goal Achievement: 09/16/17 Potential to Achieve Goals: Good Progress towards PT goals: Progressing toward goals    Frequency  7X/week      PT Plan Current plan remains appropriate    Co-evaluation              AM-PAC PT "6 Clicks" Daily Activity  Outcome Measure  Difficulty turning over in bed (including adjusting bedclothes, sheets and blankets)?: A Little Difficulty moving from lying on back to sitting on the side of the bed? : Unable Difficulty sitting down on and standing up from a chair with arms (e.g., wheelchair, bedside commode, etc,.)?: Unable Help needed moving to and from a bed to chair (including a wheelchair)?: A Little Help needed walking in hospital room?: A Little Help needed  climbing 3-5 steps with a railing? : A Little 6 Click Score: 14    End of Session Equipment Utilized During Treatment: Gait belt Activity Tolerance: Patient tolerated treatment well Patient left: in chair;with call bell/phone within reach Nurse Communication: Mobility status PT Visit Diagnosis: Other abnormalities of gait and mobility (R26.89);Pain Pain - Right/Left: Right Pain - part of body: Hip     Time: [id number] PT Time Calculation (min) (ACUTE ONLY): 10 min  Charges:  $Gait Training: 8-22 mins                    G Codes:      Kallie LocksHannah Bayan Kushnir, [place] Pager [phone] Acute Rehab  Sheral ApleyHannah E Cuma Polyakov 09/03/2017, 2:47 PM

## 2017-09-03 NOTE — Progress Notes (Signed)
Physical Therapy Treatment Patient Details Name: Bonnie Ellison MRN: [id number] DOB: Mar 05, 1946 Today's Date: 09/03/2017    History of Present Illness Pt is a 72 y/o female s/p elective R THA, direct anterior approach. PMH includes asthma, HTN, osteopenia, and COPD.     PT Comments    Pt is mobilizing well and progressing towards goals. She increased quality of gait and distance during today's session. Reviewed standing HEP. Plan to practice stairs during PM session when husband can be present.    Follow Up Recommendations  Follow surgeon's recommendation for DC plan and follow-up therapies;Supervision for mobility/OOB     Equipment Recommendations  Rolling walker with 5" wheels;3in1 (PT)    Recommendations for Other Services       Precautions / Restrictions Precautions Precautions: None Precaution Comments: reviewed standing HEP Restrictions Weight Bearing Restrictions: Yes RLE Weight Bearing: Weight bearing as tolerated    Mobility  Bed Mobility Overal bed mobility: Needs Assistance Bed Mobility: Supine to Sit     Supine to sit: Min assist     General bed mobility comments: min A for trunk elevation. Use of bedrails and HOB elevated.  Transfers Overall transfer level: Needs assistance Equipment used: Rolling walker (2 wheeled) Transfers: Sit to/from Stand Sit to Stand: Min guard         General transfer comment: min guard for safety. Cues for hand placement  Ambulation/Gait Ambulation/Gait assistance: Supervision Ambulation Distance (Feet): 320 Feet Assistive device: Rolling walker (2 wheeled) Gait Pattern/deviations: Step-to pattern;Decreased step length - right;Decreased step length - left     General Gait Details: Pt with overall steady gait and improved gait quality from previous session. Cues for increased step length.    Stairs            Wheelchair Mobility    Modified Rankin (Stroke Patients Only)       Balance Overall balance  assessment: Needs assistance Sitting-balance support: No upper extremity supported;Feet supported Sitting balance-Leahy Scale: Good     Standing balance support: Bilateral upper extremity supported;During functional activity;No upper extremity supported Standing balance-Leahy Scale: Fair Standing balance comment: able to stand at sink w/o UE support for hand hygeine                            Cognition Arousal/Alertness: Awake/alert Behavior During Therapy: WFL for tasks assessed/performed Overall Cognitive Status: Within Functional Limits for tasks assessed                                        Exercises Total Joint Exercises Hip ABduction/ADduction: AROM;Right;10 reps;Standing Long Arc Quad: AROM;Right;10 reps;Seated Knee Flexion: AROM;Right;10 reps;Standing Marching in Standing: AROM;Right;10 reps;Standing Standing Hip Extension: AROM;Right;10 reps;Standing    General Comments        Pertinent Vitals/Pain Pain Assessment: No/denies pain    Home Living                      Prior Function            PT Goals (current goals can now be found in the care plan section) Acute Rehab PT Goals Patient Stated Goal: to go home  PT Goal Formulation: With patient Time For Goal Achievement: 09/16/17 Potential to Achieve Goals: Good Progress towards PT goals: Progressing toward goals    Frequency    7X/week  PT Plan Current plan remains appropriate    Co-evaluation              AM-PAC PT "6 Clicks" Daily Activity  Outcome Measure  Difficulty turning over in bed (including adjusting bedclothes, sheets and blankets)?: A Little Difficulty moving from lying on back to sitting on the side of the bed? : Unable Difficulty sitting down on and standing up from a chair with arms (e.g., wheelchair, bedside commode, etc,.)?: Unable Help needed moving to and from a bed to chair (including a wheelchair)?: A Little Help needed  walking in hospital room?: A Little Help needed climbing 3-5 steps with a railing? : A Little 6 Click Score: 14    End of Session Equipment Utilized During Treatment: Gait belt Activity Tolerance: Patient tolerated treatment well Patient left: in chair;with call bell/phone within reach Nurse Communication: Mobility status PT Visit Diagnosis: Other abnormalities of gait and mobility (R26.89);Pain Pain - Right/Left: Right Pain - part of body: Hip     Time: 1000-1020 PT Time Calculation (min) (ACUTE ONLY): 20 min  Charges:  $Gait Training: 8-22 mins                    G Codes:       Kallie LocksHannah Oleda Borski, [place] Pager [phone] Acute Rehab   Sheral ApleyHannah E Gari Trovato 09/03/2017, 10:33 AM

## 2017-09-15 ENCOUNTER — Other Ambulatory Visit: Payer: Self-pay

## 2017-09-15 ENCOUNTER — Emergency Department (HOSPITAL_COMMUNITY): Payer: Medicare Other

## 2017-09-15 ENCOUNTER — Encounter (HOSPITAL_COMMUNITY): Payer: Self-pay

## 2017-09-15 ENCOUNTER — Emergency Department (HOSPITAL_COMMUNITY)
Admission: EM | Admit: 2017-09-15 | Discharge: 2017-09-15 | Disposition: A | Discharge status: Home / Self Care | Payer: Medicare Other | Attending: Emergency Medicine | Admitting: Emergency Medicine

## 2017-09-15 DIAGNOSIS — K644 Residual hemorrhoidal skin tags: Secondary | ICD-10-CM | POA: Insufficient documentation

## 2017-09-15 DIAGNOSIS — I10 Essential (primary) hypertension: Secondary | ICD-10-CM | POA: Diagnosis not present

## 2017-09-15 DIAGNOSIS — J45909 Unspecified asthma, uncomplicated: Secondary | ICD-10-CM | POA: Diagnosis not present

## 2017-09-15 DIAGNOSIS — Y829 Unspecified medical devices associated with adverse incidents: Secondary | ICD-10-CM | POA: Diagnosis not present

## 2017-09-15 DIAGNOSIS — T50905A Adverse effect of unspecified drugs, medicaments and biological substances, initial encounter: Secondary | ICD-10-CM | POA: Diagnosis not present

## 2017-09-15 DIAGNOSIS — K5903 Drug induced constipation: Secondary | ICD-10-CM | POA: Insufficient documentation

## 2017-09-15 DIAGNOSIS — Z79899 Other long term (current) drug therapy: Secondary | ICD-10-CM | POA: Diagnosis not present

## 2017-09-15 DIAGNOSIS — Z96641 Presence of right artificial hip joint: Secondary | ICD-10-CM | POA: Insufficient documentation

## 2017-09-15 DIAGNOSIS — T887XXA Unspecified adverse effect of drug or medicament, initial encounter: Secondary | ICD-10-CM | POA: Insufficient documentation

## 2017-09-15 DIAGNOSIS — K5900 Constipation, unspecified: Secondary | ICD-10-CM | POA: Diagnosis present

## 2017-09-15 MED ORDER — PHENYLEPHRINE IN HARD FAT 0.25 % RE SUPP: 1.0000 | Freq: Two times a day (BID) | RECTAL | 0 refills | Status: DC

## 2017-09-15 MED ORDER — POLYETHYLENE GLYCOL 3350 17 GM/SCOOP PO POWD: ORAL | 0 refills | Status: DC

## 2017-09-15 NOTE — ED Notes (Signed)
Patient transported to X-ray 

## 2017-09-15 NOTE — ED Triage Notes (Signed)
Pt endorses constipation x 4 days. Pt had recent hip replacement here and has been taking hydrocodone. VSS. Denies abd pain n/v.

## 2017-09-15 NOTE — ED Provider Notes (Signed)
[facility] Provider Note   CSN: [id number] Arrival date & time: 09/15/17  1827     History   Chief Complaint Chief Complaint  Patient presents with  . Constipation    HPI Bonnie BaltimoreGwendolyn E Ellison is a 72 y.o. female with history of GERD, celiac disease, asthma, HTN presents today for evaluation of acute onset, progressively worsening constipation for 4 days.  She had a hip replacement with Dr. Renaye Rakersim Murphy on 09/02/17 and was discharged one day later.  She has follow-up scheduled with him in 2 days.  She has been taking hydrocodone for pain and has been experiencing constipation since.  She states she normally has a bowel movement daily but last had a bowel movement 4 days ago which she stated was strained.  She does have pain with bowel movements but states that this is secondary to hemorrhoids.  She notes small amount of bright red blood after wiping.  Denies melena or hematochezia.  No dark or tarry stools.  She is on full size aspirin for DVT prophylaxis but otherwise no blood thinners.  She denies abdominal pain, nausea, or vomiting.  No fevers or chills.  She states that her pain is improving and she has been taking less hydrocodone in general but for the past few days has averaged approximately 2-3 tablets daily.  She has tried docusate sodium twice daily as well as prune juice with no improvement.  She does feel as though her abdomen is distended.  Denies urinary symptoms or vaginal complaints.  The history is provided by the patient.    Past Medical History:  Diagnosis Date  . Acid reflux   . Allergic rhinitis, cause unspecified   . Arthritis   . Asthma   . Celiac disease   . Chronic airway obstruction, not elsewhere classified   . Family history of adverse reaction to anesthesia    "patient's brother was 'slow' to wake up"  . GERD (gastroesophageal reflux disease)   . Hypertension   . Osteopenia 07/2015   T score -1.5 FRAX 4%/0.5%  . Other  chronic sinusitis   . Other hyperalimentation   . Primary osteoarthritis of hip    Right  . Unspecified asthma(493.90)   . Urticaria   . Vertigo     Patient Active Problem List   Diagnosis Date Noted  . Primary osteoarthritis of right hip 07/21/2017  . Celiac disease adult 01/15/2015  . Protein-calorie malnutrition, severe (HCC) 11/14/2014  . Hypokalemia 11/13/2014  . Essential hypertension 11/13/2014  . Malnutrition of moderate degree (HCC) 10/31/2014  . Symptomatic cholelithiasis 10/28/2014  . Benign paroxysmal positional vertigo 04/03/2014  . Seasonal and perennial allergic rhinitis 09/14/2010  . GERD 08/21/2009  . SINUSITIS, ACUTE 02/03/2008  . OBESITY HYPOVENTILATION SYNDROME 07/22/2007  . RHINOSINUSITIS, CHRONIC 07/22/2007  . Chronic obstructive asthma (HCC) 07/22/2007  . URTICARIA 07/22/2007    Past Surgical History:  Procedure Laterality Date  . ABDOMINAL HYSTERECTOMY  1991   ABDOMINAL HYSTERECTOMY  leiomyomata  . CATARACT EXTRACTION     RIGHT EYE  . CESAREAN SECTION    . CHOLECYSTECTOMY N/A 10/31/2014   Procedure: LAPAROSCOPIC CHOLECYSTECTOMY;  Surgeon: Emelia LoronMatthew Wakefield, MD;  [facility];  Service: General;  Laterality: N/A;  . DILATION AND CURETTAGE OF UTERUS    . EYE SURGERY     DETACHED RETINA RIGHT EYE...   . TOTAL ABDOMINAL HYSTERECTOMY W/ BILATERAL SALPINGOOPHORECTOMY    . TOTAL HIP ARTHROPLASTY Right 09/02/2017   Procedure: TOTAL HIP ARTHROPLASTY  ANTERIOR APPROACH;  Surgeon: Sheral Apley, MD;  Location: [facility] OR;  Service: Orthopedics;  Laterality: Right;    OB History    Gravida Para Term Preterm AB Living   2 2 2     2    SAB TAB Ectopic Multiple Live Births           2       Home Medications    Prior to Admission medications   Medication Sig Start Date End Date Taking? Authorizing Provider  acetaminophen (TYLENOL) 325 MG tablet Take 650 mg by mouth every 6 (six) hours as needed for mild pain.     [provider]  albuterol  (PROAIR HFA) 108 (90 Base) MCG/ACT inhaler USE 2 PUFFS BY MOUTH EVERY 6 HOURS AS NEEDED FOR WHEEZING AND SHORTNESS OF BREATH 06/07/16   Young, Clinton D, MD  amLODipine (NORVASC) 2.5 MG tablet Take 2.5 mg by mouth daily. 04/15/17   [provider]  aspirin EC 325 MG tablet Take 1 tablet (325 mg total) by mouth daily. For 30 days post op for DVT Prophylaxis 09/02/17   Albina Billet III, PA-C  baclofen (LIORESAL) 10 MG tablet Take 1 tablet (10 mg total) by mouth 3 (three) times daily as needed for muscle spasms. 09/02/17   Albina Billet III, PA-C  Cholecalciferol (VITAMIN D3) 2000 units capsule Take 2,000 Units by mouth daily.     [provider]  docusate sodium (COLACE) 100 MG capsule Take 1 capsule (100 mg total) by mouth 2 (two) times daily. To prevent constipation while taking pain medication. 09/02/17   Martensen, Lucretia Kern III, PA-C  fexofenadine (ALLEGRA) 180 MG tablet Take 180 mg by mouth at bedtime.     [provider]  fluticasone (FLONASE) 50 MCG/ACT nasal spray USE 2 SPRAYS IN EACH NOSTRIL TWICE A DAY Patient taking differently: Use 2 sprays in each nostril twice daily as needed for congestion 11/13/16   Jetty Duhamel D, MD  HYDROcodone-acetaminophen (NORCO) 5-325 MG tablet Take 1-2 tablets by mouth every 4 (four) hours as needed for moderate pain. 09/02/17   Albina Billet III, PA-C  irbesartan (AVAPRO) 150 MG tablet Take 150 mg by mouth daily. 03/13/15   [provider]  KLOR-CON M20 20 MEQ tablet Take 20 mEq by mouth daily.  12/13/14   [provider]  omeprazole (PRILOSEC) 40 MG capsule Take 40 mg by mouth daily as needed (for acid reflux or heartburn).  05/23/17   [provider]  ondansetron (ZOFRAN) 4 MG tablet Take 1 tablet (4 mg total) by mouth every 8 (eight) hours as needed for nausea or vomiting. 09/02/17   Albina Billet III, PA-C  phenylephrine (,USE FOR PREPARATION-H,) 0.25 % suppository Place  1 suppository rectally 2 (two) times daily. 09/15/17   Lindberg Zenon A, PA-C  polyethylene glycol powder (GLYCOLAX/MIRALAX) powder Apply 8 capfuls to 32oz of water or fluid of choice. Drink tonight. 09/15/17   Loula Marcella A, PA-C  rosuvastatin (CRESTOR) 10 MG tablet Take 10 mg by mouth daily.    [provider]  spironolactone (ALDACTONE) 25 MG tablet Take 25 mg by mouth daily. 12/29/14   [provider]  Tiotropium Bromide-Olodaterol (STIOLTO RESPIMAT) 2.5-2.5 MCG/ACT AERS Inhale 2 puffs into the lungs daily. 06/27/17   Waymon Budge, MD    Family History Family History  Problem Relation Age of Onset  . Heart failure Mother   . Heart disease Mother   . Pancreatic cancer Father   .  Cancer Father 22       PANCREATIC (DECEASED)  . Diabetes Brother        DEACESED.Marland Kitchen HAD A KIDNEY TRANSPLANT  . Stroke Brother 80       DECEASED  . Breast cancer Sister 7    Social History Social History   Tobacco Use  . Smoking status: Never Smoker  . Smokeless tobacco: Never Used  Substance Use Topics  . Alcohol use: No    Alcohol/week: 0.0 oz  . Drug use: No     Allergies   Shellfish allergy; Atorvastatin; and Wheat bran   Review of Systems Review of Systems  Constitutional: Negative for chills and fever.  Gastrointestinal: Positive for abdominal distention, constipation and rectal pain. Negative for abdominal pain, diarrhea, nausea and vomiting.  Genitourinary: Negative for decreased urine volume, dysuria, flank pain, hematuria, urgency, vaginal bleeding, vaginal discharge and vaginal pain.  Musculoskeletal: Positive for arthralgias.     Physical Exam Updated Vital Signs BP 133/74   Pulse 100   Temp 97.7 F (36.5 C) (Oral)   Resp 20   Ht 5\' 2"  (1.575 m)   Wt 99.8 kg (220 lb)   LMP 05/10/1990   SpO2 97%   BMI 40.24 kg/m   Physical Exam  Constitutional: She appears well-developed and well-nourished. No distress.  HENT:  Head: Normocephalic and atraumatic.    Eyes: Conjunctivae are normal. Right eye exhibits no discharge. Left eye exhibits no discharge.  Neck: No JVD present. No tracheal deviation present.  Cardiovascular: Normal rate.  Pulmonary/Chest: Effort normal.  Abdominal: Soft. Bowel sounds are normal. She exhibits no distension. There is no tenderness. There is no guarding.  Very mild generalized discomfort on palpation, patient feels constipated on palpation.  Murphy sign absent, Rovsing's absent, no CVA tenderness bilaterally.  Active bowel sounds in all 4 quadrants  Genitourinary:  Genitourinary Comments: Examination performed in the presence of a chaperone.  She has small external hemorrhoids which do not appear thrombosed.  No bleeding.  No fissures or tears.  Good rectal tone.  No tenderness to palpation of the hemorrhoids or rectum.  There is a palpable stool ball in the rectum which is able to be disimpacted  Musculoskeletal: She exhibits no edema.  Neurological: She is alert.  Skin: Skin is warm and dry. No erythema.  Clean dressing noted to the right thigh. No drainage or tenderness.   Psychiatric: She has a normal mood and affect. Her behavior is normal.  Nursing note and vitals reviewed.    ED Treatments / Results  Labs (all labs ordered are listed, but only abnormal results are displayed) Labs Reviewed - No data to display  EKG  EKG Interpretation None       Radiology Dg Abdomen Acute W/chest  Result Date: 09/15/2017 CLINICAL DATA:  Constipation for 2 weeks following hip surgery. EXAM: DG ABDOMEN ACUTE W/ 1V CHEST COMPARISON:  The is acute abdominal series 11/14/2014 FINDINGS: The heart is mildly enlarged. This is exaggerated by low lung volumes. There is no edema or effusion. Bowel gas pattern is normal. There is no obstruction or free air. Moderate stool is present at the rectum without obstruction. Right hip total arthroplasty is stable. IMPRESSION: 1. Moderate stool at the rectum without obstruction. 2. Otherwise  normal bowel gas pattern. 3. Right total hip arthroplasty. 4. Cardiomegaly and low lung volumes without acute cardiopulmonary disease. Electronically Signed   By: Marin Roberts M.D.   On: 09/15/2017 21:06    Procedures Fecal disimpaction Date/Time:  09/15/2017 9:45 PM Performed by: Jeanie Sewer, PA-C Authorized by: Jeanie Sewer, PA-C  Consent: Verbal consent obtained. Consent given by: patient Patient understanding: patient states understanding of the procedure being performed Patient consent: the patient's understanding of the procedure matches consent given Imaging studies: imaging studies available Patient identity confirmed: verbally with patient and arm band Preparation: Patient was prepped and draped in the usual sterile fashion. Local anesthesia used: no  Anesthesia: Local anesthesia used: no  Sedation: Patient sedated: no  Patient tolerance: Patient tolerated the procedure well with no immediate complications    (including critical care time)  Medications Ordered in ED Medications - No data to display   Initial Impression / Assessment and Plan / ED Course  I have reviewed the triage vital signs and the nursing notes.  Pertinent labs & imaging results that were available during my care of the patient were reviewed by me and considered in my medical decision making (see chart for details).     Patient presents with constipation for 4 days.  Afebrile, vital signs are stable.  She is nontoxic in appearance.  Abdominal examination is benign.  She has mild discomfort on palpation but no true pain.  She has been taking narcotics status post hip replacement 2 weeks ago and has been having ongoing issues with constipation.  X-rays show moderate stool at the rectum with no evidence of obstruction.  I doubt obstruction or perforation in the absence of pain and with patient able to tolerate p.o. food and fluids without difficulty.  History and physical examination consistent  with constipation.  Attempted to manually disimpact the patient with mild success.  She also has complained of hemorrhoids and bright red blood per rectum which she noticed earlier today only on wiping.  States this feels like her usual hemorrhoid symptoms.  No evidence of thrombosed hemorrhoid.   no evidence of surrounding abscess or rectal tear/anal fissure.  Suspect constipation is contributing to hemorrhoid pain and vice versa.  Will discharge with MiraLAX to use tonight and phenylephrine suppositories for hemorrhoid pain.  Patient has follow-up with Dr. Eulah Pont scheduled in 2 days.  Discussed indications for return to the ED.  Patient and patient's husband verbalized understanding of and agreement with plan and patient stable for discharge home at this time.  Patient seen and evaluated by Dr. Adela Lank who agrees with assessment and plan at this time.  Final Clinical Impressions(s) / ED Diagnoses   Final diagnoses:  Drug-induced constipation  External hemorrhoids    ED Discharge Orders        Ordered    polyethylene glycol powder (GLYCOLAX/MIRALAX) powder     09/15/17 2152    phenylephrine (,USE FOR PREPARATION-H,) 0.25 % suppository  2 times daily     09/15/17 2152       Jeanie Sewer, PA-C 09/16/17 0159    Melene Plan, DO 09/16/17 1610

## 2017-09-15 NOTE — Discharge Instructions (Signed)
Apply cream rectally for hemorrhoid pain twice daily.  Apply 8 capfuls of MiraLAX to 32 ounces of liquid and drink this tonight.  Continue taking your stool softener.  Follow-up with Dr. Eulah PontMurphy in 2 days as scheduled.  Return to the emergency department if any concerning signs or symptoms develop such as fevers, abdominal pain, nausea or vomiting, or blood in the urine or stool.

## 2017-12-14 ENCOUNTER — Other Ambulatory Visit: Payer: Self-pay | Admitting: Internal Medicine

## 2017-12-27 ENCOUNTER — Other Ambulatory Visit: Payer: Self-pay | Admitting: Internal Medicine

## 2017-12-27 DIAGNOSIS — J019 Acute sinusitis, unspecified: Secondary | ICD-10-CM

## 2018-01-23 ENCOUNTER — Encounter: Payer: Self-pay | Admitting: Gynecology

## 2018-01-23 ENCOUNTER — Ambulatory Visit: Payer: Medicare Other | Admitting: Gynecology

## 2018-01-23 VITALS — BP 134/82 | Ht 62.0 in | Wt 219.0 lb

## 2018-01-23 DIAGNOSIS — N952 Postmenopausal atrophic vaginitis: Secondary | ICD-10-CM | POA: Diagnosis not present

## 2018-01-23 DIAGNOSIS — M8580 Other specified disorders of bone density and structure, unspecified site: Secondary | ICD-10-CM

## 2018-01-23 DIAGNOSIS — Z01419 Encounter for gynecological examination (general) (routine) without abnormal findings: Secondary | ICD-10-CM

## 2018-01-23 NOTE — Patient Instructions (Addendum)
Followup for bone density as scheduled. 

## 2018-01-23 NOTE — Progress Notes (Signed)
    Bonnie BaltimoreGwendolyn E Ellison [birth year] [id number]        72 y.o.  G2P2002 for annual gynecologic exam.  Without gynecologic complaints  Past medical history,surgical history, problem list, medications, allergies, family history and social history were all reviewed and documented as reviewed in the EPIC chart.  ROS:  Performed with pertinent positives and negatives included in the history, assessment and plan.   Additional significant findings : None   Exam: Kennon PortelaKim Gardner assistant Vitals:   01/23/18 1122  BP: 134/82  Weight: 219 lb (99.3 kg)  Height: 5\' 2"  (1.575 m)   Body mass index is 40.06 kg/m.  General appearance:  Normal affect, orientation and appearance. Skin: Grossly normal HEENT: Without gross lesions.  No cervical or supraclavicular adenopathy. Thyroid normal.  Lungs:  Clear without wheezing, rales or rhonchi Cardiac: RR, without RMG Abdominal:  Soft, nontender, without masses, guarding, rebound, organomegaly or hernia Breasts:  Examined lying and sitting without masses, retractions, discharge or axillary adenopathy. Pelvic:  Ext, BUS, Vagina: With atrophic changes  Adnexa: Without masses or tenderness    Anus and perineum: Normal   Rectovaginal: Normal sphincter tone without palpated masses or tenderness.    Assessment/Plan:  72 y.o. 582P2002 female for annual gynecologic exam.  1. Postmenopausal/atrophic genital changes.  Status post TAH/BSO in the past for leiomyoma.  Without significant menopausal symptoms. 2. Pap smear 2011.  No Pap smear done today.  No history of abnormal Pap smears.  We both agree to stop screening per current screening guidelines based on age and hysterectomy history. 3. Colonoscopy 2016.  Repeat at their recommended interval. 4. Osteopenia.  DEXA 2017 T score -1.5 FRAX 4% / 0.5%.  Schedule DEXA now at 2-year interval and she will follow-up for this. 5. Mammography 07/2017.  Continue with annual mammography when due.  SBE monthly  reviewed. 6. Health maintenance.  No routine lab work done as patient reports this done elsewhere.  Follow-up for DEXA as scheduled.  Follow-up in 1 year, sooner as needed.   Dara Lordsimothy P Fontaine MD, 11:49 AM 01/23/2018

## 2018-03-26 ENCOUNTER — Encounter: Payer: Self-pay | Admitting: Registered"

## 2018-03-26 ENCOUNTER — Encounter: Payer: Medicare Other | Attending: Family Medicine | Admitting: Registered"

## 2018-03-26 DIAGNOSIS — Z713 Dietary counseling and surveillance: Secondary | ICD-10-CM | POA: Insufficient documentation

## 2018-03-26 DIAGNOSIS — E785 Hyperlipidemia, unspecified: Secondary | ICD-10-CM | POA: Insufficient documentation

## 2018-03-26 DIAGNOSIS — I10 Essential (primary) hypertension: Secondary | ICD-10-CM | POA: Diagnosis not present

## 2018-03-26 DIAGNOSIS — E669 Obesity, unspecified: Secondary | ICD-10-CM | POA: Diagnosis not present

## 2018-03-26 NOTE — Progress Notes (Signed)
Medical Nutrition Therapy:  Appt start time: 0945 end time:  1045.  This patient is accompanied in the office by her spouse.  Assessment:  Primary concerns today: Hx of weight changes: Weight Watchers 7 yrs ago lost/regained weight (as RD would expect because diets don't work). Pt states she lost weight after gallbladder removed 2016 due to food moving through her quickly. Pt states since surgery after eating breakfast she has to use bathroom, notices more with high fat meals and coffee.  Sleep: "well" ~8 hrs 4-tx/week. If has to use bathroom during night, hard to go back to sleep. Hard time going to sleep if mind occupied with grankids' problems. Spouse reports often in the morning she will lay in bed watching TV and when she falls asleep she snores.  Stress: 6/10. Pt states she usually has pretty low stress, but has some family stresses now that increase it at times. Pt states her sister-in-law had a stroke and is living with them now, not sure how long this situation is expected to last. Pt states her sister-in-law had gastric bypass surgery several years ago, but doesn't follow the prescribed diet and wants her to cook large breakfast with fatty foods. Pt states she and her husband have changed their morning diet and are eating more due to this change.  Pt states she typically eats 2 meals watching TV in den off kitchen. Pt states her husband and sister-in-law eat breakfast at the table in the adjoining room.   Pt states she used to go to the gym 3x week before the living situation change, now goes 1-2x week. Pt states her goal is 1 mile, but usually just walks 1/2 mile, 20 min. Pt states she stays busy with life and household chores but only has one responsibility that is scheduled - 2 pm picks up granddaughter from school.  Potassium supplement: Patient states she started potassium Rx after her surgery in 2016 and has continued since. RD suggested patient double check with her doctor regarding  her potassium supplement. Pt states her doctor does inform her of changes to medication and watching labs for potassium and is most likely fine.  Reason for suggestion to patient: ParkingAffiliateProgram.tnhttps://medlineplus.gov/druginfo/meds/a601099.html - "Do not take potassium if you are taking amiloride (Midamor), spironolactone (Aldactone), or triamterene (Dyrenium)."  Preferred Learning Style:   Auditory  Visual  Hands on  No preference indicated   Learning Readiness:   Ready  MEDICATIONS: reviewe   DIETARY INTAKE:  Usual eating pattern includes 2-3 meals and 2-3 snacks per day.  Everyday foods include fruit, vegetables.  Avoided foods include wheat.    24-hr recall:  B ( AM): 1 c coffee, flavored creamer, bacon, eggs, grits Snk ( AM): fruit, GF bread with cream cheese L ( PM): Malawiturkey sandwich, diet lipton tea Snk ( PM): none OR fruit D ( PM): salsbury stead, potatoes OR pork chop, pintos OR chicken, stir fry OR fried chicken bojangles, biscuit eats the top, tator tots OR fried fish, slaw, corn bread, pork beans OR meatloaf, string beans, corn bread or potatoes, side salad Snk ( PM): fruit Beverages: 2x week 1/2 & 1/2 tea, 36 oz water.  Usual physical activity: ADLs busy moving 1-2 times week, gym 1-2x week, 20 min 1/2 mile.  Estimated energy needs: 1600 calories  Progress Towards Goal(s):  New goal.   Nutritional Diagnosis:  NB-1.1 Food and nutrition-related knowledge deficit As related to importance of mindful eating to help with portion control.  As evidenced by pt reported  habit of distracted eating.    Intervention:  Nutrition Education. Discussed balanced eating and food groups. Discussed value of mindful eating and strategies to keep from eating past satiety. Discussed importance of sleep. Discussed strategies to get in more regular exercise.  Teaching Method Utilized:  Visual Auditory  Handouts given during visit include:  Sleep Hygiene  Sleep Center brochure  My Plate  Planner  Barriers to learning/adherence to lifestyle change: none  Demonstrated degree of understanding via:  Teach Back   Monitoring/Evaluation:  Dietary intake, exercise, sleep, and body weight prn.

## 2018-03-26 NOTE — Patient Instructions (Addendum)
If you want to try a substitute for coffee, you can try Dandelion tea. [facility]. Consider eating balanced meals and make lunch your main meal Consider trying out some of the sleep tips to get more quality sleep. Consider talking to your doctor about the potassium supplement.

## 2018-05-19 ENCOUNTER — Other Ambulatory Visit: Payer: Self-pay | Admitting: Gynecology

## 2018-05-19 ENCOUNTER — Ambulatory Visit (INDEPENDENT_AMBULATORY_CARE_PROVIDER_SITE_OTHER): Payer: Medicare Other

## 2018-05-19 ENCOUNTER — Encounter: Payer: Self-pay | Admitting: Gynecology

## 2018-05-19 DIAGNOSIS — Z780 Asymptomatic menopausal state: Secondary | ICD-10-CM | POA: Diagnosis not present

## 2018-05-19 DIAGNOSIS — M8580 Other specified disorders of bone density and structure, unspecified site: Secondary | ICD-10-CM

## 2018-06-09 ENCOUNTER — Ambulatory Visit: Payer: Medicare Other | Admitting: Internal Medicine

## 2018-07-02 ENCOUNTER — Ambulatory Visit: Payer: Medicare Other | Admitting: Internal Medicine

## 2018-07-02 ENCOUNTER — Encounter: Payer: Self-pay | Admitting: Internal Medicine

## 2018-07-02 VITALS — BP 134/82 | HR 108 | Ht 62.5 in | Wt 220.4 lb

## 2018-07-02 DIAGNOSIS — J328 Other chronic sinusitis: Secondary | ICD-10-CM

## 2018-07-02 DIAGNOSIS — J019 Acute sinusitis, unspecified: Secondary | ICD-10-CM | POA: Diagnosis not present

## 2018-07-02 DIAGNOSIS — J449 Chronic obstructive pulmonary disease, unspecified: Secondary | ICD-10-CM

## 2018-07-02 MED ORDER — BUDESONIDE-FORMOTEROL FUMARATE 80-4.5 MCG/ACT IN AERO: 2.0000 | INHALATION_SPRAY | Freq: Two times a day (BID) | RESPIRATORY_TRACT | 0 refills | Status: DC

## 2018-07-02 MED ORDER — BUDESONIDE-FORMOTEROL FUMARATE 80-4.5 MCG/ACT IN AERO: INHALATION_SPRAY | RESPIRATORY_TRACT | 12 refills | Status: DC

## 2018-07-02 MED ORDER — FLUTICASONE PROPIONATE 50 MCG/ACT NA SUSP: NASAL | 2 refills | Status: DC

## 2018-07-02 NOTE — Progress Notes (Signed)
Patient ID: Bonnie Ellison, female    DOB: 05-02-1946, 72 y.o.   MRN: [id number]  HPI F never smoker followed for chronic sinusitis, rhinitis, chronic obstructive asthma, obesity hypoventilation Office Spirometry 06/07/2016-moderate obstructive airways disease with restriction of exhaled volume. FVC 1.59/70%, FEV1 1.15/65%, ratio 0.72, FEF 25-75% 0.85/51%  --------------------------------------------------------------------------------------- 06/09/16- [age] year old female never smoker followed for chronic sinusitis, rhinitis, chronic obstructive asthma, obesity/hypoventilation, complicated by celiac disease --Asthma; Pt states she is doing well overall; slight wheezing at times.  Using rescue inhaler about once a week.  No extreme episodes and no sleep disturbance from her breathing.  Continues Allegra when needed, noticing that season and weather changes make some difference.  Not reporting headache or significant nasal discharge despite her chronic sinus disease.  07/02/18-  72 year old female never smoker followed for chronic sinusitis, rhinitis, chronic obstructive asthma, obesity/hypoventilation, complicated by celiac disease, HBP -----Doing well having a little sob during this season but other wise good. Flonase, Allegra, Needing rescue inhaler less than once per week, mostly with weather changes.  Recently noticing a little more tightness and dyspnea, again associated with weather.  We discussed trial of Singulair versus a controller inhaler. Chronic sinus symptoms have been well controlled.  Asks refill Flonase.  Review of Systems-see HPI + = positive Constitutional:   No-   weight loss, night sweats, fevers, chills, fatigue, lassitude. HEENT:    headaches,  No-difficulty swallowing, tooth/dental problems, sore throat,       No-  sneezing, itching, ear ache, +nasal congestion, + post nasal drip,  CV:  No-   chest pain, orthopnea, PND, swelling in lower extremities, anasarca,  dizziness, palpitations Resp: Increased shortness of breath with exertion or at rest.              No-   productive cough,  No non-productive cough,  No- coughing up of blood.              No-   change in color of mucus.  Little recent wheezing.   Skin: No-   rash or lesions. No hives. GI:  No-   heartburn, indigestion, abdominal pain, nausea, vomiting,  GU:  MS:  No-   joint pain or swelling.   Neuro-     nothing unusual Psych:  No- change in mood or affect. No depression or anxiety.  No memory loss. Objective:   Physical Exam General- Alert, Oriented, Affect-appropriate, Distress- none acute, + obese Skin- rash-none, lesions- none, excoriation- none Lymphadenopathy- none Head- atraumatic            Eyes- Gross vision intact, PERRLA, conjunctivae clear secretions. +Chronic periorbital edema            Ears- Hearing, canals-normal, TMs clear            Nose- +turbinate edema, no-Septal dev, mucus, polyps, erosion, perforation             Throat- Mallampati II , mucosa clear , drainage- none, tonsils- atrophic, clear w/o swelling. Much dental repair. Neck- flexible , trachea midline, no stridor , thyroid nl, carotid no bruit Chest - symmetrical excursion , unlabored           Heart/CV- RRR , no murmur , no gallop  , no rub, nl s1 s2                           - JVD- none , edema- none, stasis changes- none, varices- none  Lung-  clear , cough- none , dullness-none, rub- none           Chest wall-  Abd- Br/ Gen/ Rectal- Not done, not indicated Extrem- cyanosis- none, clubbing, none, atrophy- none, strength- nl Neuro- grossly intact to observation

## 2018-07-02 NOTE — Patient Instructions (Signed)
Script sent for Flonase refill  Sample and script printed for Symbicort 80    Maintenance controller to use every day                    Inhale 2 puffs, then rinse your mouth, twice daily  Please call if we can help

## 2018-07-05 NOTE — Assessment & Plan Note (Signed)
She minimizes current symptoms and denies recent exacerbation. Plan-okay to continue Flonase

## 2018-07-05 NOTE — Assessment & Plan Note (Signed)
Symptoms have become mild intermittent but with recent exacerbation associated with cool weather. Plan-she is going to try Symbicort 80 to see if she feels a sustained benefit at least over next several weeks.

## 2018-08-04 ENCOUNTER — Other Ambulatory Visit: Payer: Self-pay | Admitting: Gynecology

## 2018-08-04 DIAGNOSIS — Z1231 Encounter for screening mammogram for malignant neoplasm of breast: Secondary | ICD-10-CM

## 2018-08-18 ENCOUNTER — Ambulatory Visit
Admission: RE | Admit: 2018-08-18 | Discharge: 2018-08-18 | Disposition: A | Discharge status: Home / Self Care | Payer: Medicare Other | Source: Ambulatory Visit | Attending: Gynecology | Admitting: Gynecology

## 2018-08-18 DIAGNOSIS — Z1231 Encounter for screening mammogram for malignant neoplasm of breast: Secondary | ICD-10-CM

## 2018-08-19 ENCOUNTER — Other Ambulatory Visit: Payer: Self-pay | Admitting: Gynecology

## 2018-08-19 DIAGNOSIS — R928 Other abnormal and inconclusive findings on diagnostic imaging of breast: Secondary | ICD-10-CM

## 2018-08-24 ENCOUNTER — Other Ambulatory Visit: Payer: Self-pay | Admitting: Gynecology

## 2018-08-24 ENCOUNTER — Ambulatory Visit
Admission: RE | Admit: 2018-08-24 | Discharge: 2018-08-24 | Disposition: A | Discharge status: Home / Self Care | Payer: Medicare Other | Source: Ambulatory Visit | Attending: Gynecology | Admitting: Gynecology

## 2018-08-24 DIAGNOSIS — R921 Mammographic calcification found on diagnostic imaging of breast: Secondary | ICD-10-CM

## 2018-08-24 DIAGNOSIS — R928 Other abnormal and inconclusive findings on diagnostic imaging of breast: Secondary | ICD-10-CM

## 2018-09-04 ENCOUNTER — Encounter (HOSPITAL_COMMUNITY): Payer: Self-pay | Admitting: Emergency Medicine

## 2018-09-04 ENCOUNTER — Ambulatory Visit (HOSPITAL_COMMUNITY)
Admission: EM | Admit: 2018-09-04 | Discharge: 2018-09-04 | Disposition: A | Discharge status: Home / Self Care | Payer: Medicare Other | Attending: Family Medicine | Admitting: Family Medicine

## 2018-09-04 DIAGNOSIS — M62830 Muscle spasm of back: Secondary | ICD-10-CM | POA: Diagnosis not present

## 2018-09-04 MED ORDER — ACETAMINOPHEN 500 MG PO TABS: 500.0000 mg | ORAL_TABLET | Freq: Four times a day (QID) | ORAL | 0 refills | Status: AC | PRN

## 2018-09-04 MED ORDER — TIZANIDINE HCL 2 MG PO TABS: 2.0000 mg | ORAL_TABLET | Freq: Every day | ORAL | 0 refills | Status: DC

## 2018-09-04 NOTE — ED Triage Notes (Signed)
Pt presents to [facility] for assessment of 1 week of back pain, "catches" when she twists or raises her arms to wash her hair.  Also when she coughs (which she states is seldom).

## 2018-09-04 NOTE — Discharge Instructions (Signed)
Stretching, light and regular activity.  Heat, massage to the area. Tylenol every 6 hours as needed for pain. Zanaflex as a muscle relaxer at night. May cause drowsiness. Please do not take if driving or drinking alcohol.   Please call today to make a follow up appointment with your primary care provider as may need further evaluation if symptoms persist.  If develop worsening of symptoms, chest pain , shortness of breath , or otherwise worsening please go to the ER.

## 2018-09-05 NOTE — ED Provider Notes (Signed)
[facility]    CSN: [id number] Arrival date & time: 09/04/18  1333     History   Chief Complaint Chief Complaint  Patient presents with  . Back Pain    HPI Bonnie Ellison is a 73 y.o. female.   Bonnie Ellison presents with complaints of right upper back pain which has been ongoing 6 days and is painful with certain movements of her back and right arm. She states it is sharp and "catches" especially with right arm movements. No known injury or repetitive movements of right arm. She is right handed. Hasn't taken any medications for symptoms. Pain 7/10. Hasn't worsened but hasn't improved.No numbness tingling or weakness to right arm.  No chest pain  Or shortness of breath . No nausea or jaw pain. She has a history of reflux and takes medication for this. Hx of asthma, arthritis, htn, vertigo.   ROS per HPI.      Past Medical History:  Diagnosis Date  . Acid reflux   . Allergic rhinitis, cause unspecified   . Arthritis   . Asthma   . Celiac disease   . Chronic airway obstruction, not elsewhere classified   . Family history of adverse reaction to anesthesia    "patient's brother was 'slow' to wake up"  . GERD (gastroesophageal reflux disease)   . Hypertension   . Osteopenia 07/2015   T score -1.5 FRAX 4%/0.5%  . Other chronic sinusitis   . Other hyperalimentation   . Primary osteoarthritis of hip    Right  . Unspecified asthma(493.90)   . Urticaria   . Vertigo     Patient Active Problem List   Diagnosis Date Noted  . Nutritional counseling 03/26/2018  . Primary osteoarthritis of right hip 07/21/2017  . Celiac disease adult 01/15/2015  . Protein-calorie malnutrition, severe (HCC) 11/14/2014  . Hypokalemia 11/13/2014  . Essential hypertension 11/13/2014  . Malnutrition of moderate degree (HCC) 10/31/2014  . Symptomatic cholelithiasis 10/28/2014  . Benign paroxysmal positional vertigo 04/03/2014  . Seasonal and perennial allergic rhinitis 09/14/2010  .  GERD 08/21/2009  . SINUSITIS, ACUTE 02/03/2008  . OBESITY HYPOVENTILATION SYNDROME 07/22/2007  . RHINOSINUSITIS, CHRONIC 07/22/2007  . Chronic obstructive asthma (HCC) 07/22/2007  . URTICARIA 07/22/2007    Past Surgical History:  Procedure Laterality Date  . ABDOMINAL HYSTERECTOMY  1991   ABDOMINAL HYSTERECTOMY  leiomyomata  . CATARACT EXTRACTION     RIGHT EYE  . CESAREAN SECTION    . CHOLECYSTECTOMY N/A 10/31/2014   Procedure: LAPAROSCOPIC CHOLECYSTECTOMY;  Surgeon: Emelia Loron, MD;  Location: [facility] OR;  Service: General;  Laterality: N/A;  . DILATION AND CURETTAGE OF UTERUS    . EYE SURGERY     DETACHED RETINA RIGHT EYE...   . TOTAL ABDOMINAL HYSTERECTOMY W/ BILATERAL SALPINGOOPHORECTOMY    . TOTAL HIP ARTHROPLASTY Right 09/02/2017   Procedure: TOTAL HIP ARTHROPLASTY ANTERIOR APPROACH;  Surgeon: Sheral Apley, MD;  Location: [facility] OR;  Service: Orthopedics;  Laterality: Right;    OB History    Gravida  2   Para  2   Term  2   Preterm      AB      Living  2     SAB      TAB      Ectopic      Multiple      Live Births  2            Home Medications    Prior to Admission medications  Medication Sig Start Date End Date Taking? Authorizing Provider  albuterol (PROAIR HFA) 108 (90 Base) MCG/ACT inhaler USE 2 PUFFS BY MOUTH EVERY 6 HOURS AS NEEDED FOR WHEEZING AND SHORTNESS OF BREATH 12/15/17  Yes Young, Clinton D, MD  amLODipine (NORVASC) 2.5 MG tablet Take 2.5 mg by mouth daily. 04/15/17  Yes [provider]  Cholecalciferol (VITAMIN D3) 2000 units capsule Take 2,000 Units by mouth daily.    Yes [provider]  fexofenadine (ALLEGRA) 180 MG tablet Take 180 mg by mouth at bedtime.    Yes [provider]  fluticasone (FLONASE) 50 MCG/ACT nasal spray Use 2 sprays in each nostril twice daily as needed for congestion 07/02/18  Yes Young, Clinton D, MD  irbesartan (AVAPRO) 150 MG tablet Take 150 mg by mouth daily. 03/13/15  Yes  [provider]  KLOR-CON M20 20 MEQ tablet Take 20 mEq by mouth daily.  12/13/14  Yes [provider]  omeprazole (PRILOSEC) 40 MG capsule Take 40 mg by mouth daily as needed (for acid reflux or heartburn).  05/23/17  Yes [provider]  rosuvastatin (CRESTOR) 10 MG tablet Take 10 mg by mouth daily.   Yes [provider]  spironolactone (ALDACTONE) 25 MG tablet Take 25 mg by mouth daily. 12/29/14  Yes [provider]  acetaminophen (TYLENOL) 500 MG tablet Take 1 tablet (500 mg total) by mouth every 6 (six) hours as needed. 09/04/18   Georgetta Haber, NP  budesonide-formoterol (SYMBICORT) 80-4.5 MCG/ACT inhaler Inhale 2 puffs, then rinse mouth, twice daily- maintenance 07/02/18   Jetty Duhamel D, MD  budesonide-formoterol [facility]) 80-4.5 MCG/ACT inhaler Inhale 2 puffs into the lungs 2 (two) times daily. 07/02/18   Waymon Budge, MD  tiZANidine (ZANAFLEX) 2 MG tablet Take 1 tablet (2 mg total) by mouth at bedtime. 09/04/18   Georgetta Haber, NP    Family History Family History  Problem Relation Age of Onset  . Heart failure Mother   . Heart disease Mother   . Pancreatic cancer Father   . Cancer Father 44       PANCREATIC (DECEASED)  . Diabetes Brother        DEACESED.Marland Kitchen HAD A KIDNEY TRANSPLANT  . Stroke Brother 31       DECEASED  . Breast cancer Sister 73    Social History Social History   Tobacco Use  . Smoking status: Never Smoker  . Smokeless tobacco: Never Used  Substance Use Topics  . Alcohol use: No    Alcohol/week: 0.0 standard drinks  . Drug use: No     Allergies   Shellfish allergy; Atorvastatin; and Wheat bran   Review of Systems Review of Systems   Physical Exam Triage Vital Signs ED Triage Vitals  Enc Vitals Group     BP 09/04/18 1441 140/72     Pulse Rate 09/04/18 1441 78     Resp 09/04/18 1441 18     Temp 09/04/18 1441 97.9 F (36.6 C)     Temp Source 09/04/18 1441 Temporal     SpO2 09/04/18 1441  100 %     Weight --      Height --      Head Circumference --      Peak Flow --      Pain Score 09/04/18 1442 7     Pain Loc --      Pain Edu? --      Excl. in GC? --    No data  found.  Updated Vital Signs BP 140/72 (BP Location: Right Arm)   Pulse 78   Temp 97.9 F (36.6 C) (Temporal)   Resp 18   LMP 05/10/1990   SpO2 100%   Visual Acuity Right Eye Distance:   Left Eye Distance:   Bilateral Distance:    Right Eye Near:   Left Eye Near:    Bilateral Near:     Physical Exam Constitutional:      General: She is not in acute distress.    Appearance: She is well-developed.  Cardiovascular:     Rate and Rhythm: Normal rate and regular rhythm.     Heart sounds: Normal heart sounds.  Pulmonary:     Effort: Pulmonary effort is normal.     Breath sounds: Normal breath sounds.  Musculoskeletal:     Thoracic back: She exhibits tenderness and pain. She exhibits normal range of motion, no bony tenderness, no swelling, no edema, no deformity, no laceration and normal pulse.       Back:     Comments: Right upper back musculature, trapezius likely, with tenderness on palpation and reproducible with engagement with use of right arm; no bony tenderness; strength equal bilaterally; gross sensation intact to upper extremities; no abdominal pain  Skin:    General: Skin is warm and dry.  Neurological:     Mental Status: She is alert and oriented to person, place, and time.      UC Treatments / Results  Labs (all labs ordered are listed, but only abnormal results are displayed) Labs Reviewed - No data to display  EKG None  Radiology No results found.  Procedures Procedures (including critical care time)  Medications Ordered in UC Medications - No data to display  Initial Impression / Assessment and Plan / UC Course  I have reviewed the triage vital signs and the nursing notes.  Pertinent labs & imaging results that were available during my care of the patient were  reviewed by me and considered in my medical decision making (see chart for details).     Right upper back with reproducible muscular pain.no red flag findings. zanaflex and tylenol provided, encouraged activity, massage, heat application to further help. Return precautions provided. If symptoms worsen or do not improve in the next week to return to be seen or to follow up with PCP.  Patient verbalized understanding and agreeable to plan.   Final Clinical Impressions(s) / UC Diagnoses   Final diagnoses:  Muscle spasm of back     Discharge Instructions     Stretching, light and regular activity.  Heat, massage to the area. Tylenol every 6 hours as needed for pain. Zanaflex as a muscle relaxer at night. May cause drowsiness. Please do not take if driving or drinking alcohol.   Please call today to make a follow up appointment with your primary care provider as may need further evaluation if symptoms persist.  If develop worsening of symptoms, chest pain , shortness of breath , or otherwise worsening please go to the ER.    ED Prescriptions    Medication Sig Dispense Auth. Provider   tiZANidine (ZANAFLEX) 2 MG tablet Take 1 tablet (2 mg total) by mouth at bedtime. 10 tablet Linus Mako B, NP   acetaminophen (TYLENOL) 500 MG tablet Take 1 tablet (500 mg total) by mouth every 6 (six) hours as needed. 30 tablet Georgetta Haber, NP     Controlled Substance Prescriptions [place] Controlled Substance Registry consulted? Not Applicable  Georgetta Haber, NP 09/05/18 1028

## 2019-02-09 ENCOUNTER — Telehealth: Payer: Self-pay | Admitting: Internal Medicine

## 2019-02-09 NOTE — Telephone Encounter (Signed)
Form has been given to CY to sign.   Raquel Sarna, please advise when the form has been completed. Thanks!

## 2019-02-09 NOTE — Telephone Encounter (Signed)
Form has been reviewed and signed by CY I called & spoke to pt regarding this and pt stated she would like for the form to be mailed to her. I verified her address with her and let her know I would put it to outgoing mail as soon as possible. Pt expressed understanding.   Placard has been placed in an envelop w/ pt's verified address and put to outgoing mail. Nothing further needed at this time.

## 2019-02-09 NOTE — Telephone Encounter (Signed)
Yes thanks- just drop it by C pod

## 2019-02-09 NOTE — Telephone Encounter (Signed)
Patient stopped by the office yesterday to drop off a handicap placard form for Dr. Annamaria Boots to sign. She wishes to be called once this has been filled out.   Per her last chart, her last OV was on 07/02/18 with Dr. Annamaria Boots for asthma.   Dr. Annamaria Boots, please advise if you will be willing to sign patient's form? Thanks!

## 2019-02-11 ENCOUNTER — Encounter: Payer: Self-pay | Admitting: Gynecology

## 2019-02-17 ENCOUNTER — Encounter: Payer: Self-pay | Admitting: Gynecology

## 2019-02-23 ENCOUNTER — Ambulatory Visit
Admission: RE | Admit: 2019-02-23 | Discharge: 2019-02-23 | Disposition: A | Discharge status: Home / Self Care | Payer: Medicare Other | Source: Ambulatory Visit | Attending: Gynecology | Admitting: Gynecology

## 2019-02-23 ENCOUNTER — Other Ambulatory Visit: Payer: Self-pay

## 2019-02-23 ENCOUNTER — Other Ambulatory Visit: Payer: Self-pay | Admitting: Gynecology

## 2019-02-23 DIAGNOSIS — R921 Mammographic calcification found on diagnostic imaging of breast: Secondary | ICD-10-CM

## 2019-02-24 ENCOUNTER — Ambulatory Visit
Admission: RE | Admit: 2019-02-24 | Discharge: 2019-02-24 | Disposition: A | Discharge status: Home / Self Care | Payer: Medicare Other | Source: Ambulatory Visit | Attending: Gynecology | Admitting: Gynecology

## 2019-02-24 DIAGNOSIS — R921 Mammographic calcification found on diagnostic imaging of breast: Secondary | ICD-10-CM

## 2019-03-02 ENCOUNTER — Other Ambulatory Visit: Payer: Self-pay

## 2019-03-03 ENCOUNTER — Ambulatory Visit (INDEPENDENT_AMBULATORY_CARE_PROVIDER_SITE_OTHER): Payer: Medicare Other | Admitting: Gynecology

## 2019-03-03 ENCOUNTER — Encounter: Payer: Self-pay | Admitting: Gynecology

## 2019-03-03 VITALS — BP 124/84 | Ht 62.5 in | Wt 222.0 lb

## 2019-03-03 DIAGNOSIS — N898 Other specified noninflammatory disorders of vagina: Secondary | ICD-10-CM

## 2019-03-03 DIAGNOSIS — Z01419 Encounter for gynecological examination (general) (routine) without abnormal findings: Secondary | ICD-10-CM | POA: Diagnosis not present

## 2019-03-03 DIAGNOSIS — N952 Postmenopausal atrophic vaginitis: Secondary | ICD-10-CM

## 2019-03-03 MED ORDER — FLUCONAZOLE 150 MG PO TABS: 150.0000 mg | ORAL_TABLET | Freq: Once | ORAL | 0 refills | Status: AC

## 2019-03-03 NOTE — Progress Notes (Signed)
    Bonnie Ellison 1945-08-22 [id number]        73 y.o.  G2P2002 for annual gynecologic exam.  Without gynecologic complaints  Past medical history,surgical history, problem list, medications, allergies, family history and social history were all reviewed and documented as reviewed in the EPIC chart.  ROS:  Performed with pertinent positives and negatives included in the history, assessment and plan.   Additional significant findings : None   Exam: Bonnie Ellison assistant Vitals:   03/03/19 1405  BP: 124/84  Weight: 222 lb (100.7 kg)  Height: 5' 2.5" (1.588 m)   Body mass index is 39.96 kg/m.  General appearance:  Normal affect, orientation and appearance. Skin: Grossly normal HEENT: Without gross lesions.  No cervical or supraclavicular adenopathy. Thyroid normal.  Lungs:  Clear without wheezing, rales or rhonchi Cardiac: RR, without RMG Abdominal:  Soft, nontender, without masses, guarding, rebound, organomegaly or hernia Breasts:  Examined lying and sitting without masses, retractions, discharge or axillary adenopathy. Pelvic:  Ext, BUS, Vagina: Normal with atrophic changes  Adnexa: Without masses or tenderness    Anus and perineum: Normal   Rectovaginal: Normal sphincter tone without palpated masses or tenderness.    Assessment/Plan:  73 y.o. G66P2002 female for annual gynecologic exam.  Status post TAH/BSO in the past for leiomyoma  1. Postmenopausal.  No significant menopausal symptoms. 2. Vaginal itching.  Recently had dental work and took amoxicillin prophylactically.  Had some vaginal itching afterwards.  Exam normal today without significant discharge or irritation.  We will go ahead and treat her with Diflucan 150 mg x 1 dose for presumed yeast.  Follow-up if symptoms persist. 3. Pap smear 2011.  No Pap smear done today.  No history of abnormal Pap smears.  We both agree to stop screening per current screening guidelines. 4. History of osteopenia DEXA 2017 T score  -1.5 at the right hip.  Follow-up DEXA 2019 was normal noting she had her right hip replaced and all other measurements were normal.  Plan follow-up DEXA next year. 5. Mammography 02/2019.  Had follow-up biopsy due to suspicious area which showed fibroadenoma with calcifications.  Follow-up mammography at radiologist recommendation.  Breast exam normal today. 6. Colonoscopy 2016.  Repeat at their recommended interval. 7. Health maintenance.  No routine lab work done as patient does this elsewhere.  Follow-up 1 year, sooner as needed.   [name], 2:30 PM 03/03/2019

## 2019-03-03 NOTE — Patient Instructions (Signed)
Follow-up in 1 year for annual exam, sooner as needed. 

## 2019-04-07 ENCOUNTER — Other Ambulatory Visit: Payer: Self-pay

## 2019-04-07 DIAGNOSIS — Z20822 Contact with and (suspected) exposure to covid-19: Secondary | ICD-10-CM

## 2019-04-09 LAB — NOVEL CORONAVIRUS, NAA: SARS-CoV-2, NAA: NOT DETECTED

## 2019-04-21 ENCOUNTER — Encounter: Payer: Self-pay | Admitting: Gynecology

## 2019-06-24 ENCOUNTER — Other Ambulatory Visit: Payer: Self-pay

## 2019-06-25 ENCOUNTER — Encounter: Payer: Self-pay | Admitting: Gynecology

## 2019-06-25 ENCOUNTER — Ambulatory Visit: Payer: Medicare Other | Admitting: Gynecology

## 2019-06-25 VITALS — BP 124/84

## 2019-06-25 DIAGNOSIS — R350 Frequency of micturition: Secondary | ICD-10-CM | POA: Diagnosis not present

## 2019-06-25 DIAGNOSIS — M545 Low back pain, unspecified: Secondary | ICD-10-CM

## 2019-06-25 LAB — URINALYSIS, COMPLETE W/RFL CULTURE
Bacteria, UA: NONE SEEN /HPF
Bilirubin Urine: NEGATIVE
Glucose, UA: NEGATIVE
Hgb urine dipstick: NEGATIVE
Hyaline Cast: NONE SEEN /LPF
Ketones, ur: NEGATIVE
Leukocyte Esterase: NEGATIVE
Nitrites, Initial: NEGATIVE
Protein, ur: NEGATIVE
RBC / HPF: NONE SEEN /HPF (ref 0–2)
Specific Gravity, Urine: 1.01 (ref 1.001–1.03)
WBC, UA: NONE SEEN /HPF (ref 0–5)
pH: 6.5 (ref 5.0–8.0)

## 2019-06-25 LAB — NO CULTURE INDICATED

## 2019-06-25 NOTE — Progress Notes (Signed)
    Bonnie Ellison Jul 27, 1945 [id number]        73 y.o.  G2P2002 with 2-week history of low back pain.  Feels a warmth that radiates down into both hip regions.  She does have a history of hip replacement on the right although notes no pain with movement or walking.  No diarrhea constipation.  No UTI symptoms such as dysuria urgency fever or chills.  Is having some frequency but notes that she is drinking lots of fluids and is on a diuretic.  Status post TAH/BSO in the past for leiomyoma.  Past medical history,surgical history, problem list, medications, allergies, family history and social history were all reviewed and documented in the EPIC chart.  Directed ROS with pertinent positives and negatives documented in the history of present illness/assessment and plan.  Exam: Caryn Bee assistant Vitals:   06/25/19 1125  BP: 124/84   General appearance:  Normal Spine straight without tenderness or muscle spasm Abdomen soft nontender without mass guarding rebound Pelvic external BUS vagina with atrophic changes.  Bimanual without masses or tenderness.  Rectal exam is normal  Assessment/Plan:  73 y.o. G2P2002 with 2 weeks of low back pain and radiation into both hip regions.  Described as a warmth feeling.  No evidence of infection.  Urine analysis is negative.  No GI associated symptoms.  Suspect musculoskeletal.  Recommend heat to the low back and Advil/Motrin.  Recommend follow-up with primary physician if discomfort continues as I do not feel it is of GYN etiology.   [name], 11:49 AM 06/25/2019

## 2019-06-25 NOTE — Patient Instructions (Signed)
Apply heat to your lower back.  Use over-the-counter Motrin or Advil.  Follow-up with your primary provider if your low back pain continues.

## 2019-07-05 ENCOUNTER — Ambulatory Visit: Payer: Medicare Other | Admitting: Internal Medicine

## 2019-08-04 ENCOUNTER — Ambulatory Visit: Payer: Medicare Other | Admitting: Internal Medicine

## 2019-08-04 ENCOUNTER — Encounter: Payer: Self-pay | Admitting: Internal Medicine

## 2019-08-04 ENCOUNTER — Other Ambulatory Visit: Payer: Self-pay

## 2019-08-04 DIAGNOSIS — J328 Other chronic sinusitis: Secondary | ICD-10-CM | POA: Diagnosis not present

## 2019-08-04 DIAGNOSIS — J449 Chronic obstructive pulmonary disease, unspecified: Secondary | ICD-10-CM

## 2019-08-04 MED ORDER — ALBUTEROL SULFATE HFA 108 (90 BASE) MCG/ACT IN AERS: INHALATION_SPRAY | RESPIRATORY_TRACT | 12 refills | Status: DC

## 2019-08-04 NOTE — Assessment & Plan Note (Signed)
Mild intermittent symptoms now, uncomplicated. Not needing maintenance inhaler and just wants to keep rescue available. Plan- refill rescue inhaler

## 2019-08-04 NOTE — Patient Instructions (Signed)
Script sent refilling albuterol rescue inhaler  Glad you are doing so well. Please call if we can help

## 2019-08-04 NOTE — Progress Notes (Signed)
Patient ID: Bonnie Ellison, female    DOB: 1946/03/28, 74 y.o.   MRN: [id number]  HPI F never smoker followed for chronic sinusitis, rhinitis, chronic obstructive asthma, obesity hypoventilation Office Spirometry 06/07/2016-moderate obstructive airways disease with restriction of exhaled volume. FVC 1.59/70%, FEV1 1.15/65%, ratio 0.72, FEF 25-75% 0.85/51%  ---------------------------------------------------------------------------------------   07/02/18-  74 year old female never smoker followed for chronic sinusitis, rhinitis, chronic obstructive asthma, obesity/hypoventilation, complicated by celiac disease, HBP -----Doing well having a little sob during this season but other wise good. Flonase, Allegra, Needing rescue inhaler less than once per week, mostly with weather changes.  Recently noticing a little more tightness and dyspnea, again associated with weather.  We discussed trial of Singulair versus a controller inhaler. Chronic sinus symptoms have been well controlled.  Asks refill Flonase.  08/04/19- 74 year old female never smoker followed for chronic sinusitis, rhinitis, chronic obstructive asthma, obesity/hypoventilation, complicated by celiac disease, HBP Rare need for rescue inhaler and hasn't been using Symbicort at all. No exacerbation. Sinus symptoms modest without problems. Pending her first Covid vax- discussed.   Review of Systems-see HPI + = positive Constitutional:   No-   weight loss, night sweats, fevers, chills, fatigue, lassitude. HEENT:    headaches,  No-difficulty swallowing, tooth/dental problems, sore throat,       No-  sneezing, itching, ear ache, +nasal congestion, + post nasal drip,  CV:  No-   chest pain, orthopnea, PND, swelling in lower extremities, anasarca, dizziness, palpitations Resp: Increased shortness of breath with exertion or at rest.              No-   productive cough,  No non-productive cough,  No- coughing up of blood.              No-    change in color of mucus.  Little recent wheezing.   Skin: No-   rash or lesions. No hives. GI:  No-   heartburn, indigestion, abdominal pain, nausea, vomiting,  GU:  MS:  No-   joint pain or swelling.   Neuro-     nothing unusual Psych:  No- change in mood or affect. No depression or anxiety.  No memory loss. Objective:   Physical Exam General- Alert, Oriented, Affect-appropriate, Distress- none acute, + obese Skin- rash-none, lesions- none, excoriation- none Lymphadenopathy- none Head- atraumatic            Eyes- Gross vision intact, PERRLA, conjunctivae clear secretions. +Chronic periorbital edema            Ears- Hearing, canals-normal, TMs clear            Nose- +turbinate edema, no-Septal dev, mucus, polyps, erosion, perforation             Throat- Mallampati II , mucosa clear , drainage- none, tonsils- atrophic, clear w/o swelling. Much dental repair. Neck- flexible , trachea midline, no stridor , thyroid nl, carotid no bruit Chest - symmetrical excursion , unlabored           Heart/CV- RRR , no murmur , no gallop  , no rub, nl s1 s2                           - JVD- none , edema- none, stasis changes- none, varices- none           Lung-  clear , cough- none , dullness-none, rub- none           Chest  wall-  Abd- Br/ Gen/ Rectal- Not done, not indicated Extrem- cyanosis- none, clubbing, none, atrophy- none, strength- nl Neuro- grossly intact to observation

## 2019-08-04 NOTE — Assessment & Plan Note (Signed)
Chronic sinus disease on previous imaging, but no associated complaints at this time.

## 2019-08-23 ENCOUNTER — Other Ambulatory Visit: Payer: Self-pay | Admitting: Obstetrics and Gynecology

## 2019-08-23 ENCOUNTER — Other Ambulatory Visit: Payer: Self-pay | Admitting: Family Medicine

## 2019-08-23 DIAGNOSIS — Z1231 Encounter for screening mammogram for malignant neoplasm of breast: Secondary | ICD-10-CM

## 2019-08-29 ENCOUNTER — Ambulatory Visit: Payer: Medicare PPO

## 2019-09-16 ENCOUNTER — Ambulatory Visit
Admission: RE | Admit: 2019-09-16 | Discharge: 2019-09-16 | Disposition: A | Discharge status: Home / Self Care | Payer: Medicare PPO | Source: Ambulatory Visit | Attending: Family Medicine | Admitting: Family Medicine

## 2019-09-16 ENCOUNTER — Other Ambulatory Visit: Payer: Self-pay

## 2019-09-16 DIAGNOSIS — Z1231 Encounter for screening mammogram for malignant neoplasm of breast: Secondary | ICD-10-CM

## 2019-09-21 ENCOUNTER — Ambulatory Visit: Payer: Medicare PPO

## 2019-11-23 ENCOUNTER — Ambulatory Visit: Payer: Medicare PPO | Admitting: Sports Medicine

## 2019-11-23 ENCOUNTER — Encounter: Payer: Self-pay | Admitting: Sports Medicine

## 2019-11-23 ENCOUNTER — Other Ambulatory Visit: Payer: Self-pay

## 2019-11-23 ENCOUNTER — Other Ambulatory Visit: Payer: Self-pay | Admitting: Sports Medicine

## 2019-11-23 ENCOUNTER — Ambulatory Visit (INDEPENDENT_AMBULATORY_CARE_PROVIDER_SITE_OTHER): Payer: Medicare PPO

## 2019-11-23 DIAGNOSIS — M7741 Metatarsalgia, right foot: Secondary | ICD-10-CM

## 2019-11-23 DIAGNOSIS — M7742 Metatarsalgia, left foot: Secondary | ICD-10-CM

## 2019-11-23 DIAGNOSIS — M79671 Pain in right foot: Secondary | ICD-10-CM

## 2019-11-23 DIAGNOSIS — M2042 Other hammer toe(s) (acquired), left foot: Secondary | ICD-10-CM

## 2019-11-23 DIAGNOSIS — M2041 Other hammer toe(s) (acquired), right foot: Secondary | ICD-10-CM

## 2019-11-23 DIAGNOSIS — M21619 Bunion of unspecified foot: Secondary | ICD-10-CM | POA: Diagnosis not present

## 2019-11-23 DIAGNOSIS — M79672 Pain in left foot: Secondary | ICD-10-CM

## 2019-11-23 DIAGNOSIS — M19079 Primary osteoarthritis, unspecified ankle and foot: Secondary | ICD-10-CM

## 2019-11-23 NOTE — Progress Notes (Signed)
Subjective: Bonnie Ellison is a 74 y.o. female patient who presents to office for evaluation of bilateral foot pain patient reports that pain is off and on to both feet across the toes and her bunion and joint areas reports that it is sore worse with walking slowly has been worsening over the last 5 to 6 months reports that she has not tried any type of treatment for this denies any injury any increased swelling warmth redness or any other constitutional symptoms at this time. Patient denies any other pedal complaints.   Review of Systems  All other systems reviewed and are negative.    Patient Active Problem List   Diagnosis Date Noted  . Nutritional counseling 03/26/2018  . Primary osteoarthritis of right hip 07/21/2017  . Celiac disease adult 01/15/2015  . Protein-calorie malnutrition, severe (HCC) 11/14/2014  . Hypokalemia 11/13/2014  . Essential hypertension 11/13/2014  . Malnutrition of moderate degree (HCC) 10/31/2014  . Symptomatic cholelithiasis 10/28/2014  . Benign paroxysmal positional vertigo 04/03/2014  . Seasonal and perennial allergic rhinitis 09/14/2010  . GERD 08/21/2009  . SINUSITIS, ACUTE 02/03/2008  . OBESITY HYPOVENTILATION SYNDROME 07/22/2007  . RHINOSINUSITIS, CHRONIC 07/22/2007  . Chronic obstructive asthma (HCC) 07/22/2007  . URTICARIA 07/22/2007    Current Outpatient Medications on File Prior to Visit  Medication Sig Dispense Refill  . acetaminophen (TYLENOL) 500 MG tablet Take 1 tablet (500 mg total) by mouth every 6 (six) hours as needed. 30 tablet 0  . albuterol (PROAIR HFA) 108 (90 Base) MCG/ACT inhaler USE 2 PUFFS BY MOUTH EVERY 6 HOURS AS NEEDED FOR WHEEZING AND SHORTNESS OF BREATH 18 g 12  . amLODipine (NORVASC) 2.5 MG tablet Take 2.5 mg by mouth daily.  3  . budesonide-formoterol (SYMBICORT) 80-4.5 MCG/ACT inhaler Inhale 2 puffs, then rinse mouth, twice daily- maintenance 1 Inhaler 12  . Cholecalciferol (VITAMIN D3) 2000 units capsule Take 2,000  Units by mouth daily.     . Cyanocobalamin (VITAMIN B 12 PO) Take by mouth.    . fexofenadine (ALLEGRA) 180 MG tablet Take 180 mg by mouth at bedtime.     . fluticasone (FLONASE) 50 MCG/ACT nasal spray Use 2 sprays in each nostril twice daily as needed for congestion 16 g 2  . irbesartan (AVAPRO) 150 MG tablet Take 150 mg by mouth daily.  1  . KLOR-CON M20 20 MEQ tablet Take 20 mEq by mouth daily.   3  . omeprazole (PRILOSEC) 40 MG capsule Take 40 mg by mouth daily as needed (for acid reflux or heartburn).   1  . Potassium Chloride ER 20 MEQ TBCR     . rosuvastatin (CRESTOR) 10 MG tablet Take 10 mg by mouth daily.    Marland Kitchen spironolactone (ALDACTONE) 25 MG tablet Take 25 mg by mouth daily.  0   No current facility-administered medications on file prior to visit.    Allergies  Allergen Reactions  . Shellfish Allergy Anaphylaxis and Other (See Comments)    throat swelling, hives  . Atorvastatin Other (See Comments)    Leg cramps  . Wheat Bran Itching    Objective:  General: Alert and oriented x3 in no acute distress  Dermatology: No open lesions bilateral lower extremities, no webspace macerations, no ecchymosis bilateral, all nails x 10 are well manicured.  Vascular: Dorsalis Pedis and Posterior Tibial pedal pulses 1/4, Capillary Fill Time 3 seconds, (+) pedal hair growth bilateral, no edema bilateral lower extremities, Temperature gradient within normal limits.  Neurology: Gross sensation intact via  light touch bilateral.  Musculoskeletal: Minimal tenderness with palpation right and left bunion deformity, mild limitation with range of motion, deformity reducible, tracking not trackbound. Midtarsal, Subtalar joint, and ankle joint range of motion is within normal limits however there is excessive pronation supportive of pes planus deformity.  There is also lesser hammertoe deformity bilateral.  With impingement/toe crossover.   Xrays  Right/Left Foot    Impression: Midtarsal breech  supportive of pes planus.  Intermetatarsal angle above normal limits supportive of bunion deformity with lesser digital contractures supportive of hammertoe deformity that is mild joint space narrowing at the first and second metatarsophalangeal joint consistent with early arthritis.  No fracture or dislocation.  Soft tissue margins within normal limits.  No other acute findings.      Assessment and Plan: Problem List Items Addressed This Visit    None    Visit Diagnoses    Pain in both feet    -  Primary   Relevant Orders   DG Foot Complete Right   DG Foot Complete Left   Bunion       Hammer toes of both feet       Metatarsalgia of both feet       Arthritis of foot           -Complete examination performed -Xrays reviewed -Discussed treatement options; discussed HAV and hammertoe deformity with early arthritis;conservative and  Surgical management; risks, benefits, alternatives discussed. All patient's questions answered. -No injection administered at this time since pain is mild and occasional in nature.  -Dispensed bunion shields and advised patient to use when she is in shoes daily to prevent overcrowding and rubbing of the toes -Recommend over-the-counter Voltaren cream as needed for foot pain -Recommend Epson salt soaks as needed once to twice weekly -Recommend continue with good supportive shoes and inserts for foot type -Patient to return to office as needed or sooner if condition worsens.  Landis Martins, DPM

## 2019-11-23 NOTE — Patient Instructions (Signed)
Recommend OTC topical Voltaren cream for foot pain as needed   Recommend EPSOM salt soaks as needed

## 2019-11-24 ENCOUNTER — Other Ambulatory Visit: Payer: Self-pay | Admitting: Internal Medicine

## 2019-11-24 DIAGNOSIS — J019 Acute sinusitis, unspecified: Secondary | ICD-10-CM

## 2020-03-14 DIAGNOSIS — E785 Hyperlipidemia, unspecified: Secondary | ICD-10-CM | POA: Diagnosis not present

## 2020-03-14 DIAGNOSIS — I10 Essential (primary) hypertension: Secondary | ICD-10-CM | POA: Diagnosis not present

## 2020-03-14 DIAGNOSIS — Z0000 Encounter for general adult medical examination without abnormal findings: Secondary | ICD-10-CM | POA: Diagnosis not present

## 2020-03-14 DIAGNOSIS — R202 Paresthesia of skin: Secondary | ICD-10-CM | POA: Diagnosis not present

## 2020-03-14 DIAGNOSIS — J45909 Unspecified asthma, uncomplicated: Secondary | ICD-10-CM | POA: Diagnosis not present

## 2020-03-14 DIAGNOSIS — E538 Deficiency of other specified B group vitamins: Secondary | ICD-10-CM | POA: Diagnosis not present

## 2020-03-14 DIAGNOSIS — E559 Vitamin D deficiency, unspecified: Secondary | ICD-10-CM | POA: Diagnosis not present

## 2020-03-14 DIAGNOSIS — K900 Celiac disease: Secondary | ICD-10-CM | POA: Diagnosis not present

## 2020-03-14 DIAGNOSIS — K219 Gastro-esophageal reflux disease without esophagitis: Secondary | ICD-10-CM | POA: Diagnosis not present

## 2020-03-17 ENCOUNTER — Other Ambulatory Visit: Payer: Self-pay

## 2020-03-17 ENCOUNTER — Other Ambulatory Visit: Payer: Medicare PPO

## 2020-03-17 DIAGNOSIS — Z20822 Contact with and (suspected) exposure to covid-19: Secondary | ICD-10-CM

## 2020-03-18 LAB — NOVEL CORONAVIRUS, NAA: SARS-CoV-2, NAA: NOT DETECTED

## 2020-04-04 DIAGNOSIS — Z23 Encounter for immunization: Secondary | ICD-10-CM | POA: Diagnosis not present

## 2020-04-08 IMAGING — MG DIGITAL DIAGNOSTIC UNILATERAL LEFT MAMMOGRAM
3 series · 3 of 3 positions shown · non-contrast
Comparison: August 18, 2026 and earlier priors

ACR Breast Density Category a: The breast tissue is almost entirely
fatty.

CLINICAL DATA: 72-year-old patient recalled from recent screening
mammogram for evaluation of left breast calcifications.

EXAM:
DIGITAL DIAGNOSTIC LEFT MAMMOGRAM

[L CC]
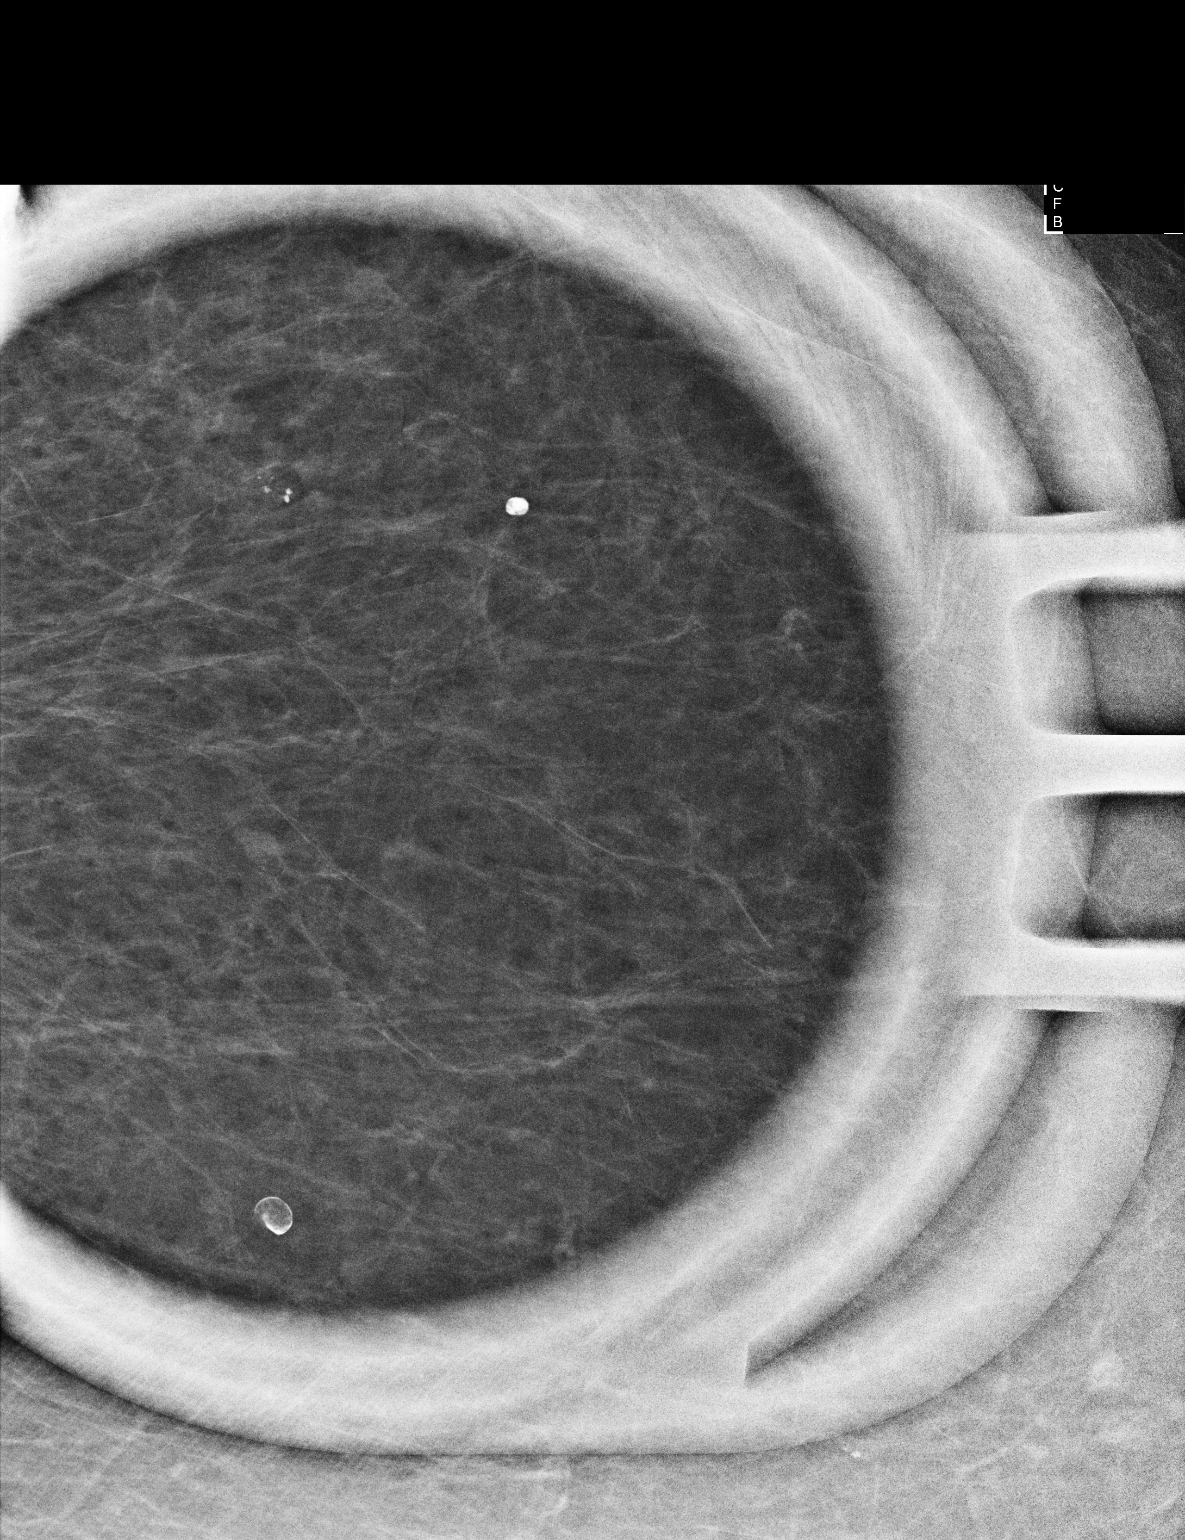

[L ML (1 of 2)]
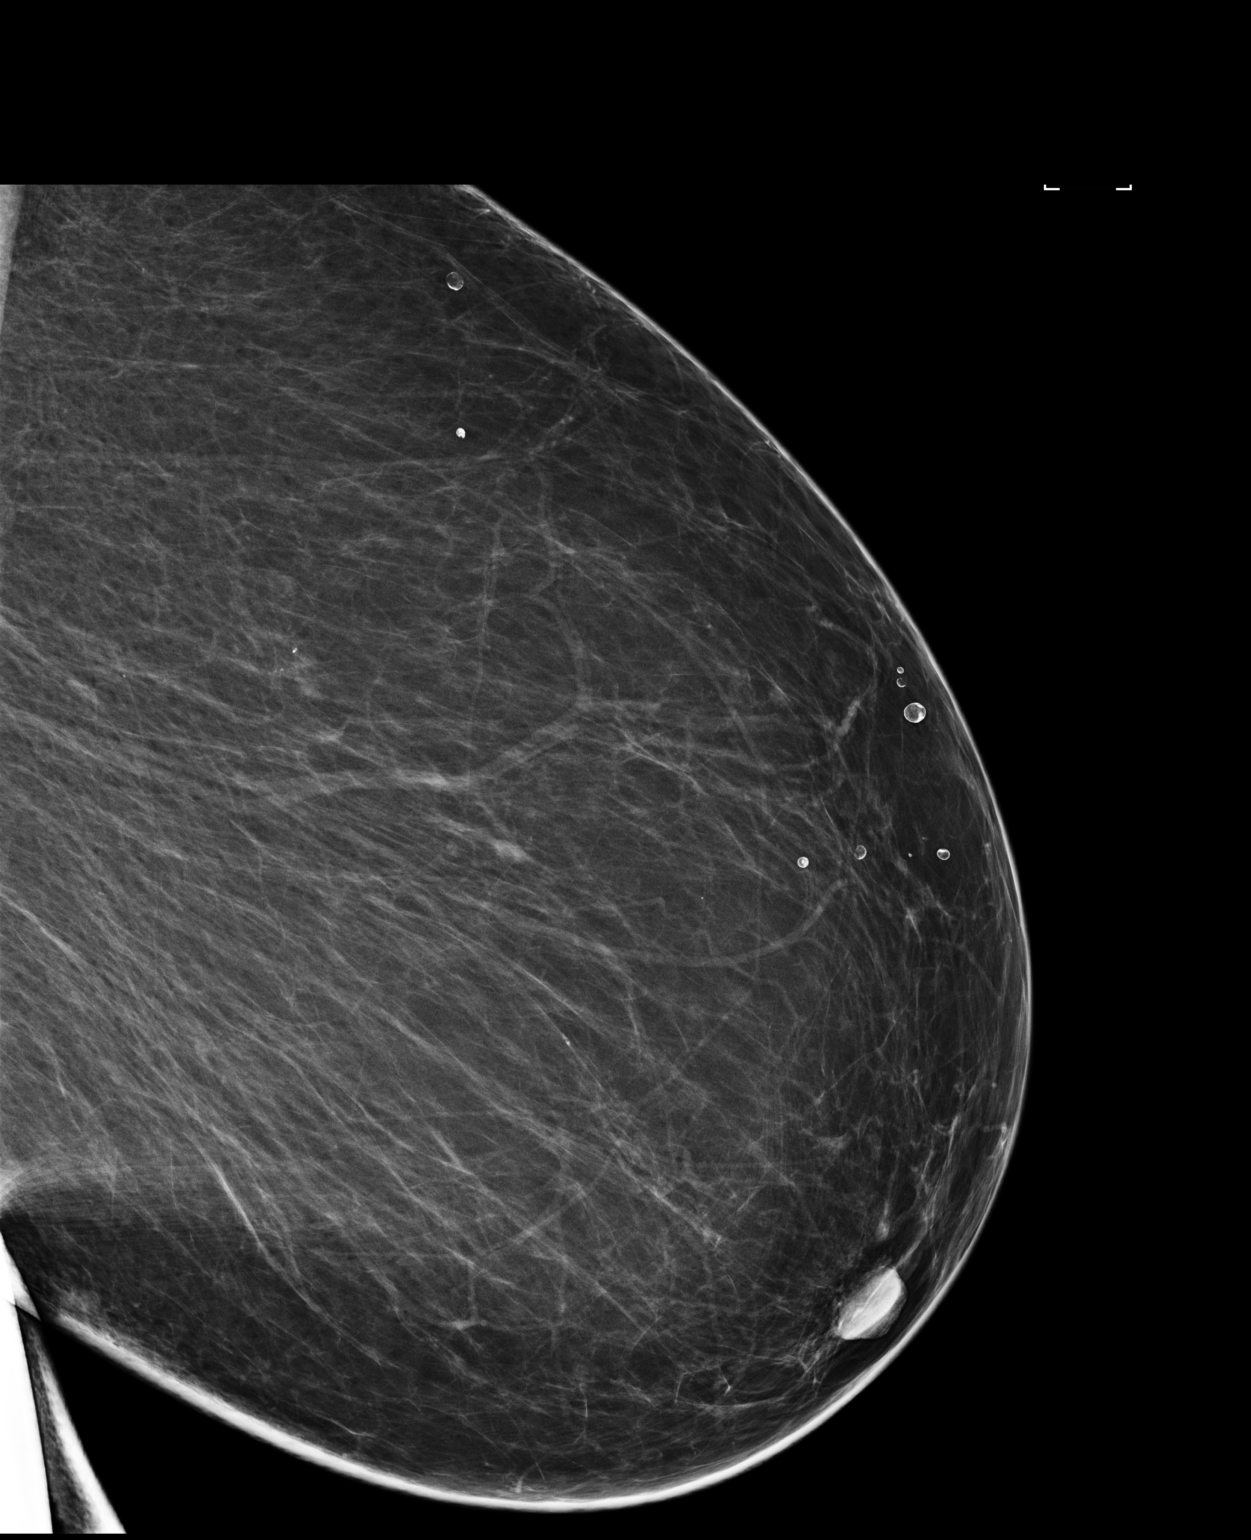

[L ML (2 of 2)]
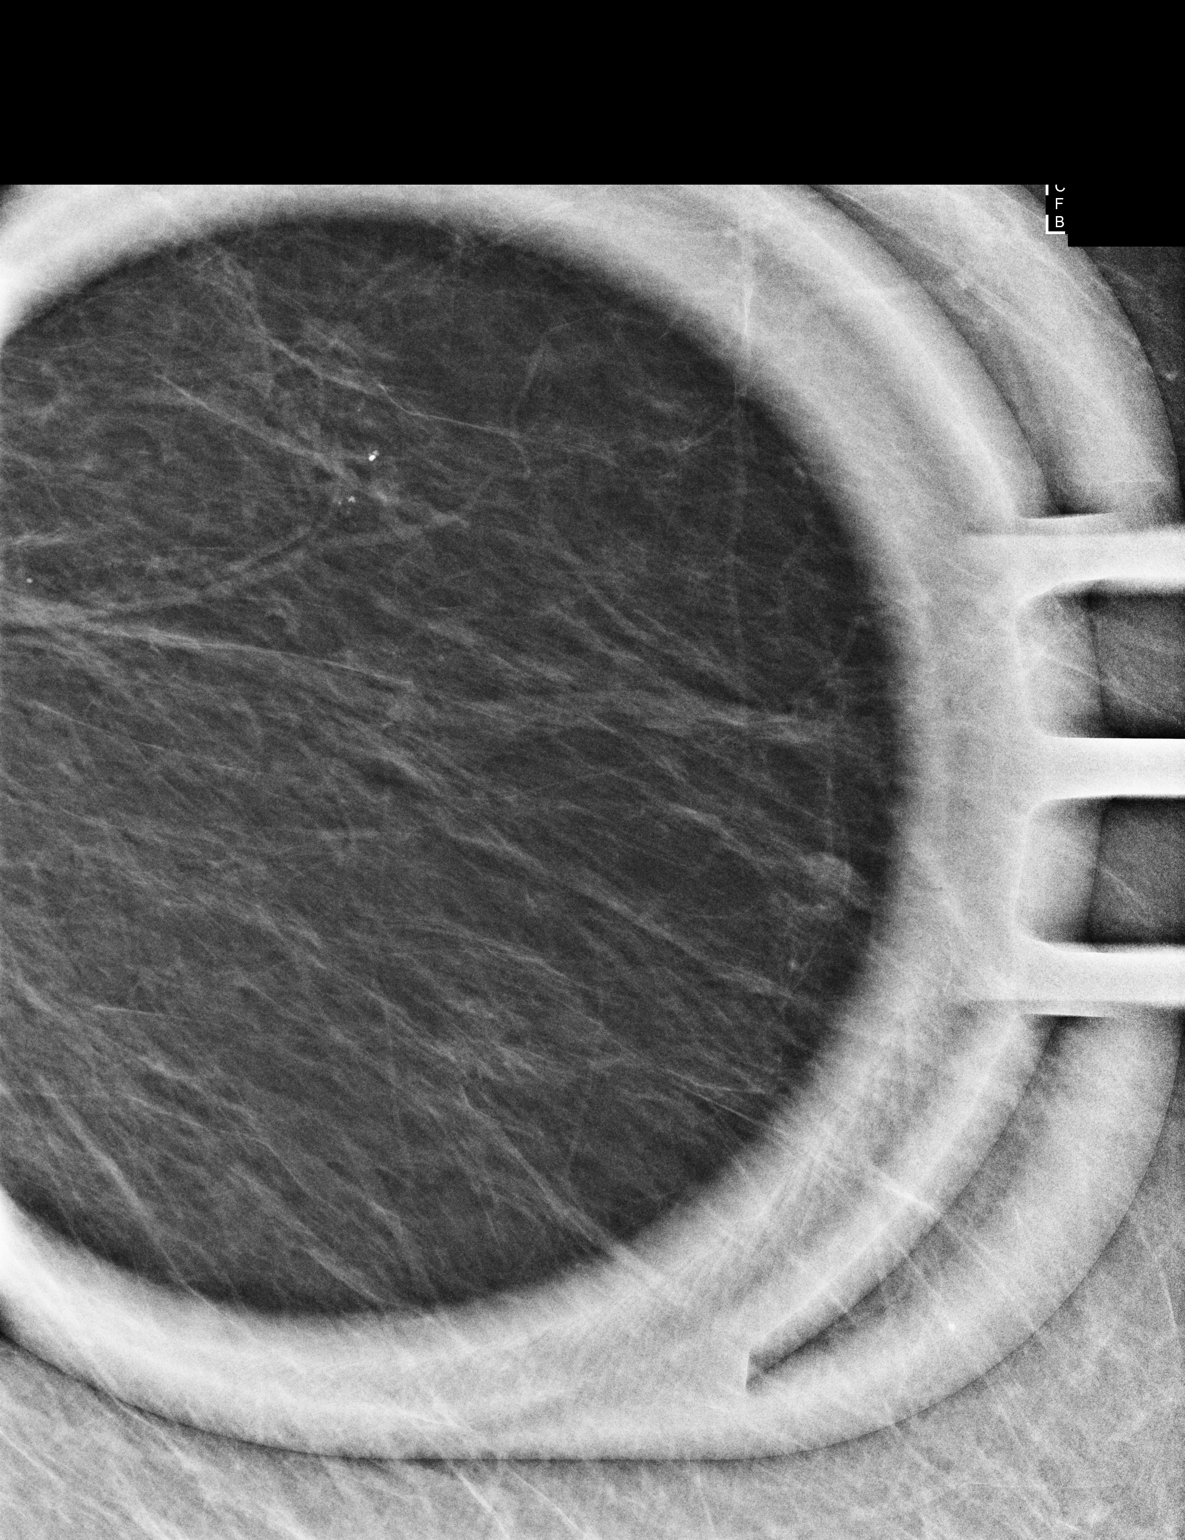

[3 of 3 positions shown; findings below may reference images not displayed]

FINDINGS: In the posterior third of the upper-outer left breast is a 6 mm
group of coarse, probably benign dystrophic benign calcifications.
There is no associated mass or distortion.
IMPRESSION: Probably benign dystrophic calcifications in the upper-outer left
breast, posterior third.

RECOMMENDATION:
Diagnostic left mammogram with magnification views is recommended in
6 months. The patient was also given the option of stereotactic
biopsy. At this point, she prefers imaging follow-up.

I have discussed the findings and recommendations with the patient.
Results were also provided in writing at the conclusion of the
visit. If applicable, a reminder letter will be sent to the patient
regarding the next appointment.

BI-RADS CATEGORY  3: Probably benign.

## 2020-04-24 ENCOUNTER — Ambulatory Visit: Payer: Medicare PPO | Attending: Internal Medicine

## 2020-04-24 DIAGNOSIS — Z23 Encounter for immunization: Secondary | ICD-10-CM

## 2020-04-24 NOTE — Progress Notes (Signed)
   Covid-19 Vaccination [facility]  Name:  Bonnie Ellison    MRN: [id number] DOB: 1946/01/26  04/24/2020  [name] was observed post Covid-19 immunization for 15 minutes without incident. She was provided with Vaccine Information Sheet and instruction to access the V-Safe system.   [name] was instructed to call 911 with any severe reactions post vaccine: Marland Kitchen Difficulty breathing  . Swelling of face and throat  . A fast heartbeat  . A bad rash all over body  . Dizziness and weakness

## 2020-05-19 ENCOUNTER — Encounter: Payer: Self-pay | Admitting: Obstetrics and Gynecology

## 2020-05-19 ENCOUNTER — Other Ambulatory Visit: Payer: Self-pay

## 2020-05-19 ENCOUNTER — Ambulatory Visit (INDEPENDENT_AMBULATORY_CARE_PROVIDER_SITE_OTHER): Payer: Medicare PPO | Admitting: Obstetrics and Gynecology

## 2020-05-19 VITALS — BP 118/76

## 2020-05-19 DIAGNOSIS — R300 Dysuria: Secondary | ICD-10-CM | POA: Diagnosis not present

## 2020-05-19 DIAGNOSIS — N898 Other specified noninflammatory disorders of vagina: Secondary | ICD-10-CM | POA: Diagnosis not present

## 2020-05-19 DIAGNOSIS — R109 Unspecified abdominal pain: Secondary | ICD-10-CM

## 2020-05-19 DIAGNOSIS — M545 Low back pain, unspecified: Secondary | ICD-10-CM

## 2020-05-19 LAB — WET PREP FOR TRICH, YEAST, CLUE

## 2020-05-19 NOTE — Progress Notes (Signed)
Bonnie Ellison 05-Dec-1945 [id number]  SUBJECTIVE:  74 y.o. G2P2002 female presents for lower back and flank pain.  She has had lower back pain for many months but in the past week or so has noticed some alternating flank pains and pain into her upper thighs bilaterally.  Also having a little bit of burning with urination at times.  No vaginal discharge.  Possible vaginal odor.  No vaginal bleeding.  Mitts to using a new soap in the vaginal area which stings a little when she uses it.  Current Outpatient Medications  Medication Sig Dispense Refill  . acetaminophen (TYLENOL) 500 MG tablet Take 1 tablet (500 mg total) by mouth every 6 (six) hours as needed. 30 tablet 0  . albuterol (PROAIR HFA) 108 (90 Base) MCG/ACT inhaler USE 2 PUFFS BY MOUTH EVERY 6 HOURS AS NEEDED FOR WHEEZING AND SHORTNESS OF BREATH 18 g 12  . amLODipine (NORVASC) 2.5 MG tablet Take 2.5 mg by mouth daily.  3  . budesonide-formoterol (SYMBICORT) 80-4.5 MCG/ACT inhaler Inhale 2 puffs, then rinse mouth, twice daily- maintenance 1 Inhaler 12  . Cholecalciferol (VITAMIN D3) 2000 units capsule Take 2,000 Units by mouth daily.     . Cyanocobalamin (VITAMIN B 12 PO) Take by mouth.    . fexofenadine (ALLEGRA) 180 MG tablet Take 180 mg by mouth at bedtime.     . fluticasone (FLONASE) 50 MCG/ACT nasal spray USE 2 SPRAYS IN EACH NOSTRIL TWICE DAILY AS NEEDED FOR CONGESTION 16 mL 3  . irbesartan (AVAPRO) 150 MG tablet Take 150 mg by mouth daily.  1  . KLOR-CON M20 20 MEQ tablet Take 20 mEq by mouth daily.   3  . omeprazole (PRILOSEC) 40 MG capsule Take 40 mg by mouth daily as needed (for acid reflux or heartburn).   1  . Potassium Chloride ER 20 MEQ TBCR     . rosuvastatin (CRESTOR) 10 MG tablet Take 10 mg by mouth daily.    Marland Kitchen spironolactone (ALDACTONE) 25 MG tablet Take 25 mg by mouth daily.  0   No current facility-administered medications for this visit.   Allergies: Shellfish allergy, Atorvastatin, and Wheat bran  Patient's  last menstrual period was 05/10/1990.  Past medical history,surgical history, problem list, medications, allergies, family history and social history were all reviewed and documented as reviewed in the EPIC chart.  ROS: Pertinent positives and negatives as reviewed in HPI   OBJECTIVE:  BP 118/76   LMP 05/10/1990  The patient appears well, alert, oriented x 3, in no distress. Back: Tenderness in midline lumbar spine and paraspinous muscles PELVIC EXAM: VULVA: normal appearing vulva with atrophic change, no masses, tenderness or lesions, VAGINA: normal appearing vagina with trophic change, normal color and no discharge, no lesions, CERVIX: surgically absent, UTERUS: Surgically absent, vaginal cuff normal, ADNEXA: Focal to palpate but no appreciable tenderness or abnormality, WET MOUNT done - results: negative for pathogens, normal epithelial cells Urinalysis 0-5 WBC, no RBC, 6-10 squamous epithelial cells, few bacteria, negative leukocyte esterase and nitrite  Chaperone: Kennon Portela present during the examination  ASSESSMENT:  74 y.o. [id number] here for musculoskeletal pain from lower back and dysuria from vulvovaginal atrophy/irritation  PLAN:  Encouraged the patient to avoid using soap on the bottom while she is having irritation.  Cleanse with water only.  She could apply petroleum jelly around the area prior to urination to decrease urine contamination of the tissues to lessen the irritation.  No evidence of UTI on UA today.  Vaginal wet mount is negative. In regard to her low back and flank pain, I think this is musculoskeletal and nerve related.  She has some low lumbar tenderness and possibly some compression of the spinal nerves would explain the pains in the flanks and thigh area.  Encouraged her to check in with her primary doctor for further evaluation of that possibility if the symptoms are bothersome enough to her.   Theresia Majors MD 05/19/20

## 2020-05-22 LAB — URINALYSIS, COMPLETE W/RFL CULTURE
Bilirubin Urine: NEGATIVE
Glucose, UA: NEGATIVE
Hgb urine dipstick: NEGATIVE
Hyaline Cast: NONE SEEN /LPF
Ketones, ur: NEGATIVE
Nitrites, Initial: NEGATIVE
Protein, ur: NEGATIVE
RBC / HPF: NONE SEEN /HPF (ref 0–2)
Specific Gravity, Urine: 1.02 (ref 1.001–1.03)
pH: 7 (ref 5.0–8.0)

## 2020-05-22 LAB — CULTURE INDICATED

## 2020-05-22 LAB — URINE CULTURE
MICRO NUMBER:: 11166474
SPECIMEN QUALITY:: ADEQUATE

## 2020-06-13 ENCOUNTER — Encounter: Payer: Self-pay | Admitting: Obstetrics and Gynecology

## 2020-06-13 ENCOUNTER — Other Ambulatory Visit: Payer: Self-pay

## 2020-06-13 ENCOUNTER — Ambulatory Visit: Payer: Medicare PPO | Admitting: Obstetrics and Gynecology

## 2020-06-13 VITALS — BP 124/80 | Ht 62.0 in | Wt 228.0 lb

## 2020-06-13 DIAGNOSIS — Z01419 Encounter for gynecological examination (general) (routine) without abnormal findings: Secondary | ICD-10-CM

## 2020-06-13 DIAGNOSIS — Z13820 Encounter for screening for osteoporosis: Secondary | ICD-10-CM

## 2020-06-13 NOTE — Progress Notes (Signed)
STUTI SANDIN Nov 15, 1945 [id number]  SUBJECTIVE:  74 y.o. G2P2002 female here for a breast and pelvic exam. She has no gynecologic concerns.  The back pain and dysuria for which she was evaluated earlier this month has resolved.  Current Outpatient Medications  Medication Sig Dispense Refill  . acetaminophen (TYLENOL) 500 MG tablet Take 1 tablet (500 mg total) by mouth every 6 (six) hours as needed. 30 tablet 0  . albuterol (PROAIR HFA) 108 (90 Base) MCG/ACT inhaler USE 2 PUFFS BY MOUTH EVERY 6 HOURS AS NEEDED FOR WHEEZING AND SHORTNESS OF BREATH 18 g 12  . amLODipine (NORVASC) 2.5 MG tablet Take 2.5 mg by mouth daily.  3  . budesonide-formoterol (SYMBICORT) 80-4.5 MCG/ACT inhaler Inhale 2 puffs, then rinse mouth, twice daily- maintenance 1 Inhaler 12  . Cholecalciferol (VITAMIN D3) 2000 units capsule Take 2,000 Units by mouth daily.     . Cyanocobalamin (VITAMIN B 12 PO) Take by mouth.    . fexofenadine (ALLEGRA) 180 MG tablet Take 180 mg by mouth at bedtime.     . fluticasone (FLONASE) 50 MCG/ACT nasal spray USE 2 SPRAYS IN EACH NOSTRIL TWICE DAILY AS NEEDED FOR CONGESTION 16 mL 3  . irbesartan (AVAPRO) 150 MG tablet Take 150 mg by mouth daily.  1  . KLOR-CON M20 20 MEQ tablet Take 20 mEq by mouth daily.   3  . omeprazole (PRILOSEC) 40 MG capsule Take 40 mg by mouth daily as needed (for acid reflux or heartburn).   1  . Potassium Chloride ER 20 MEQ TBCR     . rosuvastatin (CRESTOR) 10 MG tablet Take 10 mg by mouth daily.    Marland Kitchen spironolactone (ALDACTONE) 25 MG tablet Take 25 mg by mouth daily.  0   No current facility-administered medications for this visit.   Allergies: Shellfish allergy, Atorvastatin, and Wheat bran  Patient's last menstrual period was 05/10/1990.  Past medical history,surgical history, problem list, medications, allergies, family history and social history were all reviewed and documented as reviewed in the EPIC chart.  GYN ROS: no abnormal bleeding, pelvic  pain or discharge, no breast pain or new or enlarging lumps on self exam.  No dysuria, frequency, burning, pain with urination, cloudy/malodorous urine.   OBJECTIVE:  BP 124/80 (BP Location: Right Arm, Patient Position: Sitting, Cuff Size: Large)   Ht 5\' 2"  (1.575 m)   Wt 228 lb (103.4 kg)   LMP 05/10/1990   BMI 41.70 kg/m  The patient appears well, alert, oriented, in no distress.  BREAST EXAM: breasts appear normal, no suspicious masses, no skin or nipple changes or axillary nodes  PELVIC EXAM: VULVA: normal appearing vulva with atrophic change, no masses, tenderness or lesions, VAGINA: normal appearing vagina with atrophic change, normal color and discharge, no lesions, CERVIX: surgically absent, UTERUS: surgically absent, vaginal cuff normal, ADNEXA: no masses, nontender  Chaperone: 05/12/1990 Bonham present during the examination  ASSESSMENT:  74 y.o. 66 here for a breast and pelvic exam  PLAN:   1. Postmenopausal. Prior TAH/BSO for leiomyoma.  No significant menopausal symptoms.  No vaginal bleeding. 2. Pap smear 2011.  No significant history of abnormal Pap smears.  Comfortable not screening per the current guidelines based on age criteria. 3. Mammogram 09/2019.  Had imaging follow-up from a biopsy of a suspicious area which showed fibroadenoma with calcifications 2020.  Normal breast exam today.  She is reminded to schedule an annual mammogram when due, radiology recommendation was to repeat at 1 year interval.  4. Colonoscopy 2016.  She will follow up at the interval recommended by her GI specialist.   5. History of osteopenia noted on 2017 DEXA.  Follow-up DEXA 05/2018 reported as normal and she had a hip replacement.  Next DEXA recommended this year at the 2-year interval so she plans to schedule this. 6. Health maintenance.  No labs today as she normally has these completed elsewhere.  Return annually or sooner, prn.  Theresia Majors MD 06/13/20

## 2020-06-28 DIAGNOSIS — H31001 Unspecified chorioretinal scars, right eye: Secondary | ICD-10-CM | POA: Diagnosis not present

## 2020-06-28 DIAGNOSIS — H5212 Myopia, left eye: Secondary | ICD-10-CM | POA: Diagnosis not present

## 2020-07-06 DIAGNOSIS — H31001 Unspecified chorioretinal scars, right eye: Secondary | ICD-10-CM | POA: Diagnosis not present

## 2020-07-06 DIAGNOSIS — H2512 Age-related nuclear cataract, left eye: Secondary | ICD-10-CM | POA: Diagnosis not present

## 2020-07-13 DIAGNOSIS — I10 Essential (primary) hypertension: Secondary | ICD-10-CM | POA: Diagnosis not present

## 2020-07-13 DIAGNOSIS — K900 Celiac disease: Secondary | ICD-10-CM | POA: Diagnosis not present

## 2020-07-13 DIAGNOSIS — E559 Vitamin D deficiency, unspecified: Secondary | ICD-10-CM | POA: Diagnosis not present

## 2020-07-13 DIAGNOSIS — E538 Deficiency of other specified B group vitamins: Secondary | ICD-10-CM | POA: Diagnosis not present

## 2020-07-13 DIAGNOSIS — Z5181 Encounter for therapeutic drug level monitoring: Secondary | ICD-10-CM | POA: Diagnosis not present

## 2020-07-13 DIAGNOSIS — E739 Lactose intolerance, unspecified: Secondary | ICD-10-CM | POA: Diagnosis not present

## 2020-07-13 DIAGNOSIS — K219 Gastro-esophageal reflux disease without esophagitis: Secondary | ICD-10-CM | POA: Diagnosis not present

## 2020-07-13 DIAGNOSIS — J45909 Unspecified asthma, uncomplicated: Secondary | ICD-10-CM | POA: Diagnosis not present

## 2020-07-13 DIAGNOSIS — E785 Hyperlipidemia, unspecified: Secondary | ICD-10-CM | POA: Diagnosis not present

## 2020-08-03 ENCOUNTER — Ambulatory Visit: Payer: Medicare PPO | Admitting: Internal Medicine

## 2020-08-15 DIAGNOSIS — H2512 Age-related nuclear cataract, left eye: Secondary | ICD-10-CM | POA: Diagnosis not present

## 2020-08-15 DIAGNOSIS — H25812 Combined forms of age-related cataract, left eye: Secondary | ICD-10-CM | POA: Diagnosis not present

## 2020-08-27 NOTE — Progress Notes (Signed)
Patient ID: Bonnie Ellison, female    DOB: 1946-02-08, 75 y.o.   MRN: [id number]  HPI F never smoker followed for chronic sinusitis, rhinitis, chronic obstructive asthma, obesity hypoventilation Office Spirometry 06/07/2016-moderate obstructive airways disease with restriction of exhaled volume. FVC 1.59/70%, FEV1 1.15/65%, ratio 0.72, FEF 25-75% 0.85/51%  ---------------------------------------------------------------------------------------   08/04/19- 75 year old female never smoker followed for chronic sinusitis, rhinitis, chronic obstructive asthma, obesity/hypoventilation, complicated by celiac disease, HBP Rare need for rescue inhaler and hasn't been using Symbicort at all. No exacerbation. Sinus symptoms modest without problems. Pending her first Covid vax- discussed.   08/28/20- 75 year old female never smoker followed for chronic sinusitis, rhinitis, chronic obstructive asthma, obesity/hypoventilation, complicated by celiac disease, HBP -Symbiicort 80, Proair HFA, Flonase Covid vax-3 Phizer Flu vax-had -----Patient states that in the morning she has a lot of drainage and is wondering if she can do something other than allegra ACT score- 24 Morning ccough blamed on PNdrip.  Now on eye drops after cataract surgery  Review of Systems-see HPI + = positive Constitutional:   No-   weight loss, night sweats, fevers, chills, fatigue, lassitude. HEENT:    headaches,  No-difficulty swallowing, tooth/dental problems, sore throat,       No-  sneezing, itching, ear ache, +nasal congestion, + post nasal drip,  CV:  No-   chest pain, orthopnea, PND, swelling in lower extremities, anasarca, dizziness, palpitations Resp: Increased shortness of breath with exertion or at rest.              No-   productive cough,  + non-productive cough,  No- coughing up of blood.              No-   change in color of mucus.  Little recent wheezing.   Skin: No-   rash or lesions. No hives. GI:  No-    heartburn, indigestion, abdominal pain, nausea, vomiting,  GU:  MS:  No-   joint pain or swelling.   Neuro-     nothing unusual Psych:  No- change in mood or affect. No depression or anxiety.  No memory loss. Objective:   Physical Exam General- Alert, Oriented, Affect-appropriate, Distress- none acute, + obese Skin- rash-none, lesions- none, excoriation- none Lymphadenopathy- none Head- atraumatic            Eyes- Gross vision intact, PERRLA, conjunctivae clear secretions. +Chronic periorbital edema            Ears- Hearing, canals-normal, TMs clear            Nose- turbinate edema, no-Septal dev, mucus, polyps, erosion, perforation             Throat- Mallampati II , mucosa clear , drainage- none, tonsils- atrophic, clear w/o swelling. Much dental repair. Neck- flexible , trachea midline, no stridor , thyroid nl, carotid no bruit Chest - symmetrical excursion , unlabored           Heart/CV- RRR , no murmur , no gallop  , no rub, nl s1 s2                           - JVD- none , edema- none, stasis changes- none, varices- none           Lung-  clear , cough- none , dullness-none, rub- none           Chest wall-  Abd- Br/ Gen/ Rectal- Not done, not indicated Extrem- cyanosis- none,  clubbing, none, atrophy- none, strength- nl Neuro- grossly intact to observation

## 2020-08-28 ENCOUNTER — Encounter: Payer: Self-pay | Admitting: Internal Medicine

## 2020-08-28 ENCOUNTER — Other Ambulatory Visit: Payer: Self-pay

## 2020-08-28 ENCOUNTER — Ambulatory Visit: Payer: Medicare PPO | Admitting: Internal Medicine

## 2020-08-28 DIAGNOSIS — J449 Chronic obstructive pulmonary disease, unspecified: Secondary | ICD-10-CM | POA: Diagnosis not present

## 2020-08-28 DIAGNOSIS — J328 Other chronic sinusitis: Secondary | ICD-10-CM

## 2020-08-28 MED ORDER — IPRATROPIUM BROMIDE 0.03 % NA SOLN: 2.0000 | Freq: Two times a day (BID) | NASAL | 12 refills | Status: DC

## 2020-08-28 NOTE — Patient Instructions (Signed)
Script sent for ipratropium nasal spray to see if this helps with the overnight feeling of drainage into your throat   1-2 puffs each nostril at bedtime. You can keep using the Flonase, but I would suggest using it in the morning,so it doesn't wash the ipratropium off before it can work.  Please call if we can help

## 2020-08-30 ENCOUNTER — Other Ambulatory Visit: Payer: Self-pay | Admitting: Obstetrics and Gynecology

## 2020-08-30 DIAGNOSIS — Z1231 Encounter for screening mammogram for malignant neoplasm of breast: Secondary | ICD-10-CM

## 2020-09-08 DIAGNOSIS — E785 Hyperlipidemia, unspecified: Secondary | ICD-10-CM | POA: Diagnosis not present

## 2020-09-08 DIAGNOSIS — J449 Chronic obstructive pulmonary disease, unspecified: Secondary | ICD-10-CM | POA: Diagnosis not present

## 2020-09-08 DIAGNOSIS — M1611 Unilateral primary osteoarthritis, right hip: Secondary | ICD-10-CM | POA: Diagnosis not present

## 2020-09-08 DIAGNOSIS — J45909 Unspecified asthma, uncomplicated: Secondary | ICD-10-CM | POA: Diagnosis not present

## 2020-09-08 DIAGNOSIS — M179 Osteoarthritis of knee, unspecified: Secondary | ICD-10-CM | POA: Diagnosis not present

## 2020-09-08 DIAGNOSIS — H269 Unspecified cataract: Secondary | ICD-10-CM | POA: Diagnosis not present

## 2020-09-08 DIAGNOSIS — K219 Gastro-esophageal reflux disease without esophagitis: Secondary | ICD-10-CM | POA: Diagnosis not present

## 2020-09-08 DIAGNOSIS — I10 Essential (primary) hypertension: Secondary | ICD-10-CM | POA: Diagnosis not present

## 2020-10-12 ENCOUNTER — Ambulatory Visit
Admission: RE | Admit: 2020-10-12 | Discharge: 2020-10-12 | Disposition: A | Discharge status: Home / Self Care | Payer: Medicare PPO | Source: Ambulatory Visit | Attending: Obstetrics and Gynecology | Admitting: Obstetrics and Gynecology

## 2020-10-12 ENCOUNTER — Other Ambulatory Visit: Payer: Self-pay

## 2020-10-12 DIAGNOSIS — Z1231 Encounter for screening mammogram for malignant neoplasm of breast: Secondary | ICD-10-CM | POA: Diagnosis not present

## 2020-11-14 DIAGNOSIS — I10 Essential (primary) hypertension: Secondary | ICD-10-CM | POA: Diagnosis not present

## 2020-11-14 DIAGNOSIS — M1611 Unilateral primary osteoarthritis, right hip: Secondary | ICD-10-CM | POA: Diagnosis not present

## 2020-11-14 DIAGNOSIS — H269 Unspecified cataract: Secondary | ICD-10-CM | POA: Diagnosis not present

## 2020-11-14 DIAGNOSIS — E538 Deficiency of other specified B group vitamins: Secondary | ICD-10-CM | POA: Diagnosis not present

## 2020-11-14 DIAGNOSIS — K219 Gastro-esophageal reflux disease without esophagitis: Secondary | ICD-10-CM | POA: Diagnosis not present

## 2020-11-14 DIAGNOSIS — M179 Osteoarthritis of knee, unspecified: Secondary | ICD-10-CM | POA: Diagnosis not present

## 2020-11-14 DIAGNOSIS — J45909 Unspecified asthma, uncomplicated: Secondary | ICD-10-CM | POA: Diagnosis not present

## 2020-11-14 DIAGNOSIS — E559 Vitamin D deficiency, unspecified: Secondary | ICD-10-CM | POA: Diagnosis not present

## 2020-11-14 DIAGNOSIS — J449 Chronic obstructive pulmonary disease, unspecified: Secondary | ICD-10-CM | POA: Diagnosis not present

## 2020-11-14 DIAGNOSIS — E785 Hyperlipidemia, unspecified: Secondary | ICD-10-CM | POA: Diagnosis not present

## 2020-11-28 ENCOUNTER — Other Ambulatory Visit: Payer: Self-pay | Admitting: Obstetrics and Gynecology

## 2020-11-28 ENCOUNTER — Ambulatory Visit (INDEPENDENT_AMBULATORY_CARE_PROVIDER_SITE_OTHER): Payer: Medicare PPO

## 2020-11-28 ENCOUNTER — Other Ambulatory Visit: Payer: Self-pay

## 2020-11-28 DIAGNOSIS — Z780 Asymptomatic menopausal state: Secondary | ICD-10-CM | POA: Diagnosis not present

## 2020-11-28 DIAGNOSIS — M85852 Other specified disorders of bone density and structure, left thigh: Secondary | ICD-10-CM

## 2020-11-28 DIAGNOSIS — Z13820 Encounter for screening for osteoporosis: Secondary | ICD-10-CM

## 2020-11-28 DIAGNOSIS — Z01419 Encounter for gynecological examination (general) (routine) without abnormal findings: Secondary | ICD-10-CM

## 2020-11-28 NOTE — Assessment & Plan Note (Signed)
Mild morning vcough without wheeze, most likely post nasal drip as she suspects.  Plan- try ipratropium nasal spray

## 2020-11-28 NOTE — Assessment & Plan Note (Signed)
Chronic old inflammation. Have not identified any vasculits.

## 2021-02-14 DIAGNOSIS — L820 Inflamed seborrheic keratosis: Secondary | ICD-10-CM | POA: Diagnosis not present

## 2021-02-14 DIAGNOSIS — L821 Other seborrheic keratosis: Secondary | ICD-10-CM | POA: Diagnosis not present

## 2021-02-14 DIAGNOSIS — L280 Lichen simplex chronicus: Secondary | ICD-10-CM | POA: Diagnosis not present

## 2021-03-08 DIAGNOSIS — L249 Irritant contact dermatitis, unspecified cause: Secondary | ICD-10-CM | POA: Diagnosis not present

## 2021-03-08 DIAGNOSIS — L821 Other seborrheic keratosis: Secondary | ICD-10-CM | POA: Diagnosis not present

## 2021-04-20 DIAGNOSIS — Z809 Family history of malignant neoplasm, unspecified: Secondary | ICD-10-CM | POA: Diagnosis not present

## 2021-04-20 DIAGNOSIS — Z833 Family history of diabetes mellitus: Secondary | ICD-10-CM | POA: Diagnosis not present

## 2021-04-20 DIAGNOSIS — K219 Gastro-esophageal reflux disease without esophagitis: Secondary | ICD-10-CM | POA: Diagnosis not present

## 2021-04-20 DIAGNOSIS — J45909 Unspecified asthma, uncomplicated: Secondary | ICD-10-CM | POA: Diagnosis not present

## 2021-04-20 DIAGNOSIS — Z6841 Body Mass Index (BMI) 40.0 and over, adult: Secondary | ICD-10-CM | POA: Diagnosis not present

## 2021-04-20 DIAGNOSIS — E785 Hyperlipidemia, unspecified: Secondary | ICD-10-CM | POA: Diagnosis not present

## 2021-04-20 DIAGNOSIS — M1990 Unspecified osteoarthritis, unspecified site: Secondary | ICD-10-CM | POA: Diagnosis not present

## 2021-04-20 DIAGNOSIS — I10 Essential (primary) hypertension: Secondary | ICD-10-CM | POA: Diagnosis not present

## 2021-04-27 ENCOUNTER — Other Ambulatory Visit: Payer: Self-pay | Admitting: Internal Medicine

## 2021-05-18 DIAGNOSIS — E785 Hyperlipidemia, unspecified: Secondary | ICD-10-CM | POA: Diagnosis not present

## 2021-05-18 DIAGNOSIS — H269 Unspecified cataract: Secondary | ICD-10-CM | POA: Diagnosis not present

## 2021-05-18 DIAGNOSIS — Z5181 Encounter for therapeutic drug level monitoring: Secondary | ICD-10-CM | POA: Diagnosis not present

## 2021-05-18 DIAGNOSIS — I10 Essential (primary) hypertension: Secondary | ICD-10-CM | POA: Diagnosis not present

## 2021-05-18 DIAGNOSIS — J449 Chronic obstructive pulmonary disease, unspecified: Secondary | ICD-10-CM | POA: Diagnosis not present

## 2021-05-18 DIAGNOSIS — E538 Deficiency of other specified B group vitamins: Secondary | ICD-10-CM | POA: Diagnosis not present

## 2021-05-18 DIAGNOSIS — K219 Gastro-esophageal reflux disease without esophagitis: Secondary | ICD-10-CM | POA: Diagnosis not present

## 2021-05-18 DIAGNOSIS — E559 Vitamin D deficiency, unspecified: Secondary | ICD-10-CM | POA: Diagnosis not present

## 2021-05-18 DIAGNOSIS — M1611 Unilateral primary osteoarthritis, right hip: Secondary | ICD-10-CM | POA: Diagnosis not present

## 2021-05-18 DIAGNOSIS — J45909 Unspecified asthma, uncomplicated: Secondary | ICD-10-CM | POA: Diagnosis not present

## 2021-05-22 DIAGNOSIS — Z0000 Encounter for general adult medical examination without abnormal findings: Secondary | ICD-10-CM | POA: Diagnosis not present

## 2021-05-22 DIAGNOSIS — Z1389 Encounter for screening for other disorder: Secondary | ICD-10-CM | POA: Diagnosis not present

## 2021-06-14 ENCOUNTER — Ambulatory Visit (INDEPENDENT_AMBULATORY_CARE_PROVIDER_SITE_OTHER): Payer: Medicare PPO | Admitting: Obstetrics & Gynecology

## 2021-06-14 ENCOUNTER — Other Ambulatory Visit: Payer: Self-pay

## 2021-06-14 ENCOUNTER — Encounter: Payer: Self-pay | Admitting: Obstetrics & Gynecology

## 2021-06-14 VITALS — BP 116/70 | HR 85 | Resp 16 | Ht 61.5 in | Wt 224.0 lb

## 2021-06-14 DIAGNOSIS — M549 Dorsalgia, unspecified: Secondary | ICD-10-CM | POA: Diagnosis not present

## 2021-06-14 DIAGNOSIS — Z9079 Acquired absence of other genital organ(s): Secondary | ICD-10-CM

## 2021-06-14 DIAGNOSIS — M810 Age-related osteoporosis without current pathological fracture: Secondary | ICD-10-CM | POA: Diagnosis not present

## 2021-06-14 DIAGNOSIS — Z01419 Encounter for gynecological examination (general) (routine) without abnormal findings: Secondary | ICD-10-CM | POA: Diagnosis not present

## 2021-06-14 DIAGNOSIS — M8588 Other specified disorders of bone density and structure, other site: Secondary | ICD-10-CM

## 2021-06-14 DIAGNOSIS — Z780 Asymptomatic menopausal state: Secondary | ICD-10-CM | POA: Diagnosis not present

## 2021-06-14 DIAGNOSIS — Z9189 Other specified personal risk factors, not elsewhere classified: Secondary | ICD-10-CM

## 2021-06-14 DIAGNOSIS — E66813 Obesity, class 3: Secondary | ICD-10-CM

## 2021-06-14 DIAGNOSIS — Z90710 Acquired absence of both cervix and uterus: Secondary | ICD-10-CM

## 2021-06-14 DIAGNOSIS — Z90722 Acquired absence of ovaries, bilateral: Secondary | ICD-10-CM

## 2021-06-14 DIAGNOSIS — Z6841 Body Mass Index (BMI) 40.0 and over, adult: Secondary | ICD-10-CM

## 2021-06-14 LAB — URINALYSIS, COMPLETE W/RFL CULTURE
Bacteria, UA: NONE SEEN /HPF
Bilirubin Urine: NEGATIVE
Casts: NONE SEEN /LPF
Crystals: NONE SEEN /HPF
Glucose, UA: NEGATIVE
Hgb urine dipstick: NEGATIVE
Hyaline Cast: NONE SEEN /LPF
Ketones, ur: NEGATIVE
Leukocyte Esterase: NEGATIVE
Nitrites, Initial: NEGATIVE
Protein, ur: NEGATIVE
RBC / HPF: NONE SEEN /HPF (ref 0–2)
Specific Gravity, Urine: 1.02 (ref 1.001–1.035)
WBC, UA: NONE SEEN /HPF (ref 0–5)
Yeast: NONE SEEN /HPF
pH: 7 (ref 5.0–8.0)

## 2021-06-14 LAB — NO CULTURE INDICATED

## 2021-06-14 NOTE — Progress Notes (Signed)
Bonnie Ellison 1946-03-07 [id number]   History:    75 y.o. G2P2L2 Married  RP:  Established patient presenting for annual gyn exam   HPI: Postmenopausal. Prior TAH/BSO for leiomyoma.  No significant menopausal symptoms.  No pelvic pain.  Occasional Rt lower back pain, no radiation to legs.  No vaginal bleeding. Pap smear 2011 Neg.  No significant history of abnormal Pap smears.  Breasts normal. Mammogram 09/2020 Neg.  COLONOSCOPY: 2016, on a 10 yr schedule.  History of osteopenia noted on 11/2020 DEXA with T-Score at -1.4, Frax no increased risk.  H/O Hip replacement.  BMI 41.64.  Health labs with Fam MD.  Past medical history,surgical history, family history and social history were all reviewed and documented in the EPIC chart.  Gynecologic History Patient's last menstrual period was 05/10/1990.  Obstetric History OB History  Gravida Para Term Preterm AB Living  2 2 2     2   SAB IAB Ectopic Multiple Live Births          2    # Outcome Date GA Lbr Len/2nd Weight Sex Delivery Anes PTL Lv  2 Term     F CS-Unspec  N LIV  1 Term     F CS-Unspec  N LIV     ROS: A ROS was performed and pertinent positives and negatives are included in the history.  GENERAL: No fevers or chills. HEENT: No change in vision, no earache, sore throat or sinus congestion. NECK: No pain or stiffness. CARDIOVASCULAR: No chest pain or pressure. No palpitations. PULMONARY: No shortness of breath, cough or wheeze. GASTROINTESTINAL: No abdominal pain, nausea, vomiting or diarrhea, melena or bright red blood per rectum. GENITOURINARY: No urinary frequency, urgency, hesitancy or dysuria. MUSCULOSKELETAL: No joint or muscle pain, no back pain, no recent trauma. DERMATOLOGIC: No rash, no itching, no lesions. ENDOCRINE: No polyuria, polydipsia, no heat or cold intolerance. No recent change in weight. HEMATOLOGICAL: No anemia or easy bruising or bleeding. NEUROLOGIC: No headache, seizures, numbness, tingling or weakness.  PSYCHIATRIC: No depression, no loss of interest in normal activity or change in sleep pattern.     Exam:   BP 116/70   Pulse 85   Resp 16   Ht 5' 1.5" (1.562 m)   Wt 224 lb (101.6 kg)   LMP 05/10/1990   BMI 41.64 kg/m   Body mass index is 41.64 kg/m.  General appearance : Well developed well nourished female. No acute distress HEENT: Eyes: no retinal hemorrhage or exudates,  Neck supple, trachea midline, no carotid bruits, no thyroidmegaly Lungs: Clear to auscultation, no rhonchi or wheezes, or rib retractions  Heart: Regular rate and rhythm, no murmurs or gallops Breast:Examined in sitting and supine position were symmetrical in appearance, no palpable masses or tenderness,  no skin retraction, no nipple inversion, no nipple discharge, no skin discoloration, no axillary or supraclavicular lymphadenopathy Abdomen: no palpable masses or tenderness, no rebound or guarding Extremities: no edema or skin discoloration or tenderness  Pelvic: Vulva: Normal             Vagina: No gross lesions or discharge  Cervix/Uterus absent  Adnexa  Without masses or tenderness  Anus: Normal  U/A Negative   Assessment/Plan:  75 y.o. female for annual exam   1. Well female exam with routine gynecological exam Postmenopausal. Prior TAH/BSO for leiomyoma.  No significant menopausal symptoms.  No pelvic pain.  Occasional Rt lower back pain, no radiation to legs.  No vaginal bleeding. Pap  smear 2011 Neg.  No significant history of abnormal Pap smears.  Breasts normal. Mammogram 09/2020 Neg.  COLONOSCOPY: 2016, on a 10 yr schedule. History of osteopenia noted on 11/2020 DEXA with T-Score at -1.4, Frax no increased risk.  H/O Hip replacement.  BMI 41.64.  Health labs with Fam MD.  2. At risk of fracture due to osteopenia  3. S/P TAH-BSO  4. Postmenopause Postmenopausal. Prior TAH/BSO for leiomyoma.  No significant menopausal symptoms.   5. Osteopenia of lumbar spine History of osteopenia noted on  11/2020 DEXA with T-Score at -1.4, Frax no increased risk.  H/O Hip replacement. Continue Vit D, Ca++, walking.  6. Back pain, unspecified back location, unspecified back pain laterality, unspecified chronicity U/A Neg. - Urinalysis,Complete w/RFL Culture  7. Class 3 severe obesity due to excess calories with serious comorbidity and body mass index (BMI) of 40.0 to 44.9 in adult [facility]) Lower Calorie/Carb diet.  Regular walking.  Other orders - triamcinolone (KENALOG) 0.025 % cream; Apply 1 application topically 2 (two) times daily.   Genia Del MD, 9:53 AM 06/14/2021

## 2021-07-12 DIAGNOSIS — U071 COVID-19: Secondary | ICD-10-CM | POA: Diagnosis not present

## 2021-09-25 DIAGNOSIS — H35341 Macular cyst, hole, or pseudohole, right eye: Secondary | ICD-10-CM | POA: Diagnosis not present

## 2021-09-25 DIAGNOSIS — Z961 Presence of intraocular lens: Secondary | ICD-10-CM | POA: Diagnosis not present

## 2021-09-25 DIAGNOSIS — H26493 Other secondary cataract, bilateral: Secondary | ICD-10-CM | POA: Diagnosis not present

## 2021-09-25 DIAGNOSIS — H524 Presbyopia: Secondary | ICD-10-CM | POA: Diagnosis not present

## 2021-10-08 ENCOUNTER — Other Ambulatory Visit: Payer: Self-pay | Admitting: Obstetrics & Gynecology

## 2021-10-08 DIAGNOSIS — Z1231 Encounter for screening mammogram for malignant neoplasm of breast: Secondary | ICD-10-CM

## 2021-10-19 ENCOUNTER — Ambulatory Visit
Admission: RE | Admit: 2021-10-19 | Discharge: 2021-10-19 | Disposition: A | Discharge status: Home / Self Care | Payer: Medicare PPO | Source: Ambulatory Visit | Attending: Obstetrics & Gynecology | Admitting: Obstetrics & Gynecology

## 2021-10-19 DIAGNOSIS — Z1231 Encounter for screening mammogram for malignant neoplasm of breast: Secondary | ICD-10-CM | POA: Diagnosis not present

## 2021-11-20 DIAGNOSIS — K219 Gastro-esophageal reflux disease without esophagitis: Secondary | ICD-10-CM | POA: Diagnosis not present

## 2021-11-20 DIAGNOSIS — Z5181 Encounter for therapeutic drug level monitoring: Secondary | ICD-10-CM | POA: Diagnosis not present

## 2021-11-20 DIAGNOSIS — J449 Chronic obstructive pulmonary disease, unspecified: Secondary | ICD-10-CM | POA: Diagnosis not present

## 2021-11-20 DIAGNOSIS — E559 Vitamin D deficiency, unspecified: Secondary | ICD-10-CM | POA: Diagnosis not present

## 2021-11-20 DIAGNOSIS — M1611 Unilateral primary osteoarthritis, right hip: Secondary | ICD-10-CM | POA: Diagnosis not present

## 2021-11-20 DIAGNOSIS — E538 Deficiency of other specified B group vitamins: Secondary | ICD-10-CM | POA: Diagnosis not present

## 2021-11-20 DIAGNOSIS — H269 Unspecified cataract: Secondary | ICD-10-CM | POA: Diagnosis not present

## 2021-11-20 DIAGNOSIS — E785 Hyperlipidemia, unspecified: Secondary | ICD-10-CM | POA: Diagnosis not present

## 2021-11-20 DIAGNOSIS — I10 Essential (primary) hypertension: Secondary | ICD-10-CM | POA: Diagnosis not present

## 2022-06-25 ENCOUNTER — Ambulatory Visit (INDEPENDENT_AMBULATORY_CARE_PROVIDER_SITE_OTHER): Payer: Medicare PPO | Admitting: Obstetrics & Gynecology

## 2022-06-25 ENCOUNTER — Encounter: Payer: Self-pay | Admitting: Obstetrics & Gynecology

## 2022-06-25 VITALS — BP 108/70 | HR 91 | Ht 61.5 in | Wt 220.0 lb

## 2022-06-25 DIAGNOSIS — Z9079 Acquired absence of other genital organ(s): Secondary | ICD-10-CM

## 2022-06-25 DIAGNOSIS — M8589 Other specified disorders of bone density and structure, multiple sites: Secondary | ICD-10-CM

## 2022-06-25 DIAGNOSIS — Z90710 Acquired absence of both cervix and uterus: Secondary | ICD-10-CM

## 2022-06-25 DIAGNOSIS — Z01419 Encounter for gynecological examination (general) (routine) without abnormal findings: Secondary | ICD-10-CM | POA: Diagnosis not present

## 2022-06-25 DIAGNOSIS — Z6841 Body Mass Index (BMI) 40.0 and over, adult: Secondary | ICD-10-CM

## 2022-06-25 DIAGNOSIS — Z90722 Acquired absence of ovaries, bilateral: Secondary | ICD-10-CM

## 2022-06-25 DIAGNOSIS — Z780 Asymptomatic menopausal state: Secondary | ICD-10-CM

## 2022-06-25 NOTE — Progress Notes (Signed)
LESSLI DEBATES Mar 13, 1946 [id number]   History:    76 y.o. G2P2L2 Married   RP:  Established patient presenting for annual gyn exam    HPI: Postmenopausal. Prior TAH/BSO for leiomyoma.  No significant menopausal symptoms.  No pelvic pain.  No vaginal bleeding. Pap smear 2011 Neg.  No significant history of abnormal Pap smears. Last Pap Neg in 2011.  No indication to repeat Pap at this time. Breasts normal. Mammogram 10/2021 Neg.  COLONOSCOPY: 2016, on a 10 yr schedule.  History of osteopenia noted on 11/2020 DEXA with T-Score at -1.4 at the Lt Fem Neck, Frax no increased risk.  Repeat BD 11/2022.  H/O Hip replacement.  BMI 40.9.  Health labs with Fam MD. Flu vaccine done at pcp.    Past medical history,surgical history, family history and social history were all reviewed and documented in the EPIC chart.  Gynecologic History Patient's last menstrual period was 05/10/1990.  Obstetric History OB History  Gravida Para Term Preterm AB Living  2 2 2     2   SAB IAB Ectopic Multiple Live Births          2    # Outcome Date GA Lbr Len/2nd Weight Sex Delivery Anes PTL Lv  2 Term     F CS-Unspec  N LIV  1 Term     F CS-Unspec  N LIV    ROS: A ROS was performed and pertinent positives and negatives are included in the history. GENERAL: No fevers or chills. HEENT: No change in vision, no earache, sore throat or sinus congestion. NECK: No pain or stiffness. CARDIOVASCULAR: No chest pain or pressure. No palpitations. PULMONARY: No shortness of breath, cough or wheeze. GASTROINTESTINAL: No abdominal pain, nausea, vomiting or diarrhea, melena or bright red blood per rectum. GENITOURINARY: No urinary frequency, urgency, hesitancy or dysuria. MUSCULOSKELETAL: No joint or muscle pain, no back pain, no recent trauma. DERMATOLOGIC: No rash, no itching, no lesions. ENDOCRINE: No polyuria, polydipsia, no heat or cold intolerance. No recent change in weight. HEMATOLOGICAL: No anemia or easy bruising or  bleeding. NEUROLOGIC: No headache, seizures, numbness, tingling or weakness. PSYCHIATRIC: No depression, no loss of interest in normal activity or change in sleep pattern.     Exam:   BP 108/70   Pulse 91   Ht 5' 1.5" (1.562 m)   Wt 220 lb (99.8 kg)   LMP 05/10/1990   SpO2 98%   BMI 40.90 kg/m   Body mass index is 40.9 kg/m.  General appearance : Well developed well nourished female. No acute distress HEENT: Eyes: no retinal hemorrhage or exudates,  Neck supple, trachea midline, no carotid bruits, no thyroidmegaly Lungs: Clear to auscultation, no rhonchi or wheezes, or rib retractions  Heart: Regular rate and rhythm, no murmurs or gallops Breast:Examined in sitting and supine position were symmetrical in appearance, no palpable masses or tenderness,  no skin retraction, no nipple inversion, no nipple discharge, no skin discoloration, no axillary or supraclavicular lymphadenopathy Abdomen: no palpable masses or tenderness, no rebound or guarding Extremities: no edema or skin discoloration or tenderness  Pelvic: Vulva: Normal             Vagina: No gross lesions or discharge  Cervix/Uterus absent  Adnexa  Without masses or tenderness  Anus: Normal   Assessment/Plan:  76 y.o. female for annual exam   1. Well female exam with routine gynecological exam Postmenopausal. Prior TAH/BSO for leiomyoma.  No significant menopausal symptoms.  No pelvic pain.  No vaginal bleeding. Pap smear 2011 Neg.  No significant history of abnormal Pap smears. Last Pap Neg in 2011.  No indication to repeat Pap at this time. Breasts normal. Mammogram 10/2021 Neg.  COLONOSCOPY: 2016, on a 10 yr schedule.  History of osteopenia noted on 11/2020 DEXA with T-Score at -1.4 at the Lt Fem Neck, Frax no increased risk.  Repeat BD 11/2022.  H/O Hip replacement.  BMI 40.9.  Health labs with Fam MD. Flu vaccine done at pcp.   2. S/P TAH-BSO  3. Postmenopause Postmenopausal. Prior TAH/BSO for leiomyoma.  No  significant menopausal symptoms.  No pelvic pain.  No vaginal bleeding.   4. Osteopenia of multiple sites History of osteopenia noted on 11/2020 DEXA with T-Score at -1.4 at the Lt Fem Neck, Frax no increased risk.  Repeat BD 11/2022. Continue walking, Vit D and Ca++. - DG Bone Density; Future  5. Class 3 severe obesity due to excess calories with serious comorbidity and body mass index (BMI) of 40.0 to 44.9 in adult (HCC)  Mild decrease in weight x last year.  Genia Del MD, 9:13 AM

## 2022-10-17 ENCOUNTER — Other Ambulatory Visit: Payer: Self-pay | Admitting: Obstetrics & Gynecology

## 2022-10-17 DIAGNOSIS — Z1231 Encounter for screening mammogram for malignant neoplasm of breast: Secondary | ICD-10-CM

## 2022-10-22 ENCOUNTER — Ambulatory Visit
Admission: RE | Admit: 2022-10-22 | Discharge: 2022-10-22 | Disposition: A | Discharge status: Home / Self Care | Payer: Medicare PPO | Source: Ambulatory Visit | Attending: Obstetrics & Gynecology | Admitting: Obstetrics & Gynecology

## 2022-10-22 DIAGNOSIS — Z1231 Encounter for screening mammogram for malignant neoplasm of breast: Secondary | ICD-10-CM | POA: Diagnosis not present

## 2022-11-29 DIAGNOSIS — E559 Vitamin D deficiency, unspecified: Secondary | ICD-10-CM | POA: Diagnosis not present

## 2022-11-29 DIAGNOSIS — Z6841 Body Mass Index (BMI) 40.0 and over, adult: Secondary | ICD-10-CM | POA: Diagnosis not present

## 2022-11-29 DIAGNOSIS — J4489 Other specified chronic obstructive pulmonary disease: Secondary | ICD-10-CM | POA: Diagnosis not present

## 2022-11-29 DIAGNOSIS — I10 Essential (primary) hypertension: Secondary | ICD-10-CM | POA: Diagnosis not present

## 2022-11-29 DIAGNOSIS — J4530 Mild persistent asthma, uncomplicated: Secondary | ICD-10-CM | POA: Diagnosis not present

## 2022-11-29 DIAGNOSIS — E785 Hyperlipidemia, unspecified: Secondary | ICD-10-CM | POA: Diagnosis not present

## 2022-12-06 DIAGNOSIS — I10 Essential (primary) hypertension: Secondary | ICD-10-CM | POA: Diagnosis not present

## 2022-12-10 ENCOUNTER — Other Ambulatory Visit: Payer: Self-pay | Admitting: Obstetrics & Gynecology

## 2022-12-10 ENCOUNTER — Ambulatory Visit (INDEPENDENT_AMBULATORY_CARE_PROVIDER_SITE_OTHER): Payer: Medicare PPO

## 2022-12-10 DIAGNOSIS — Z01419 Encounter for gynecological examination (general) (routine) without abnormal findings: Secondary | ICD-10-CM

## 2022-12-10 DIAGNOSIS — M8589 Other specified disorders of bone density and structure, multiple sites: Secondary | ICD-10-CM

## 2022-12-10 DIAGNOSIS — Z13820 Encounter for screening for osteoporosis: Secondary | ICD-10-CM

## 2022-12-10 DIAGNOSIS — Z780 Asymptomatic menopausal state: Secondary | ICD-10-CM | POA: Diagnosis not present

## 2022-12-10 DIAGNOSIS — Z90710 Acquired absence of both cervix and uterus: Secondary | ICD-10-CM

## 2023-01-07 ENCOUNTER — Telehealth: Payer: Self-pay | Admitting: Internal Medicine

## 2023-01-07 NOTE — Telephone Encounter (Signed)
PT called for over due FU appt. States she is out of Albuterol inhaler. Pls call @ [phone]   [facility] [address]

## 2023-01-08 NOTE — Telephone Encounter (Signed)
Pt States she is out of Albuterol inhaler.  Pls call back

## 2023-01-13 ENCOUNTER — Other Ambulatory Visit: Payer: Self-pay | Admitting: Internal Medicine

## 2023-01-13 MED ORDER — ALBUTEROL SULFATE HFA 108 (90 BASE) MCG/ACT IN AERS: INHALATION_SPRAY | RESPIRATORY_TRACT | 0 refills | Status: DC

## 2023-01-13 NOTE — Telephone Encounter (Signed)
Called and spoke with patient. She was requesting a refill on her albuterol inhaler. She was last seen in 2022 but has an appt scheduled for next month. I advised her I would send in one refill until her appt, she verbalized understanding.   Nothing further needed at time of call.

## 2023-02-06 DIAGNOSIS — M1990 Unspecified osteoarthritis, unspecified site: Secondary | ICD-10-CM | POA: Diagnosis not present

## 2023-02-06 DIAGNOSIS — I10 Essential (primary) hypertension: Secondary | ICD-10-CM | POA: Diagnosis not present

## 2023-02-06 DIAGNOSIS — E785 Hyperlipidemia, unspecified: Secondary | ICD-10-CM | POA: Diagnosis not present

## 2023-02-06 DIAGNOSIS — Z809 Family history of malignant neoplasm, unspecified: Secondary | ICD-10-CM | POA: Diagnosis not present

## 2023-02-06 DIAGNOSIS — Z8249 Family history of ischemic heart disease and other diseases of the circulatory system: Secondary | ICD-10-CM | POA: Diagnosis not present

## 2023-02-06 DIAGNOSIS — R609 Edema, unspecified: Secondary | ICD-10-CM | POA: Diagnosis not present

## 2023-02-06 DIAGNOSIS — E876 Hypokalemia: Secondary | ICD-10-CM | POA: Diagnosis not present

## 2023-02-06 DIAGNOSIS — K219 Gastro-esophageal reflux disease without esophagitis: Secondary | ICD-10-CM | POA: Diagnosis not present

## 2023-02-21 ENCOUNTER — Ambulatory Visit: Payer: Medicare PPO | Admitting: Internal Medicine

## 2023-03-14 DIAGNOSIS — R252 Cramp and spasm: Secondary | ICD-10-CM | POA: Diagnosis not present

## 2023-03-14 DIAGNOSIS — I10 Essential (primary) hypertension: Secondary | ICD-10-CM | POA: Diagnosis not present

## 2023-03-14 DIAGNOSIS — Z6841 Body Mass Index (BMI) 40.0 and over, adult: Secondary | ICD-10-CM | POA: Diagnosis not present

## 2023-03-26 NOTE — Progress Notes (Signed)
Patient ID: Bonnie Ellison, female    DOB: 04-Oct-1945, 77 y.o.   MRN: [id number]  HPI F never smoker followed for chronic sinusitis, rhinitis, chronic obstructive asthma, obesity hypoventilation Office Spirometry 06/07/2016-moderate obstructive airways disease with restriction of exhaled volume. FVC 1.59/70%, FEV1 1.15/65%, ratio 0.72, FEF 25-75% 0.85/51%  ---------------------------------------------------------------------------------------   08/28/20- 77 year old female never smoker followed for chronic sinusitis, rhinitis, chronic obstructive asthma, obesity/hypoventilation, complicated by celiac disease, HBP -Symbiicort 80, Proair HFA, Flonase Covid vax-3 Phizer Flu vax-had -----Patient states that in the morning she has a lot of drainage and is wondering if she can do something other than allegra ACT score- 24 Morning ccough blamed on PNdrip.  Now on eye drops after cataract surgery  03/27/23- 77 year old female never smoker followed for chronic sinusitis, rhinitis, chronic obstructive asthma, Obesity/hypoventilation, complicated by Celiac disease, HTN, -Symbicort 80, Proair HFA, Flonase Flu vax--planned for October -----Breathing has been good  Occasional fullness in sinuses and yellow after using Flonase. Rarely headache. Uses only albuterol inhaler- about 1x/week. Not using Symbicort. Unfamiliar with nasal saline rinse.  Review of Systems-see HPI + = positive Constitutional:   No-   weight loss, night sweats, fevers, chills, fatigue, lassitude. HEENT:    headaches,  No-difficulty swallowing, tooth/dental problems, sore throat,       No-  sneezing, itching, ear ache, +nasal congestion, + post nasal drip,  CV:  No-   chest pain, orthopnea, PND, swelling in lower extremities, anasarca, dizziness, palpitations Resp: Increased shortness of breath with exertion or at rest.              No-   productive cough,  + non-productive cough,  No- coughing up of blood.              No-    change in color of mucus.  Little recent wheezing.   Skin: No-   rash or lesions. No hives. GI:  No-   heartburn, indigestion, abdominal pain, nausea, vomiting,  GU:  MS:  No-   joint pain or swelling.   Neuro-     nothing unusual Psych:  No- change in mood or affect. No depression or anxiety.  No memory loss. Objective:   Physical Exam General- Alert, Oriented, Affect-appropriate, Distress- none acute,            + obese Skin- rash-none, lesions- none, excoriation- none Lymphadenopathy- none Head- atraumatic            Eyes- Gross vision intact, PERRLA, +strabismus/ R eye dev laterally, +Chronic periorbital edema            Ears- Hearing, canals-normal, TMs clear            Nose- turbinate edema, no-Septal dev, mucus, polyps, erosion, perforation             Throat- Mallampati II , mucosa clear , drainage- none, tonsils- atrophic, clear w/o swelling. Much dental repair. Neck- flexible , trachea midline, no stridor , thyroid nl, carotid no bruit Chest - symmetrical excursion , unlabored           Heart/CV- RRR , no murmur , no gallop  , no rub, nl s1 s2                           - JVD- none , edema- none, stasis changes- none, varices- none           Lung-  clear , cough- none , dullness-none,  rub- none           Chest wall-  Abd- Br/ Gen/ Rectal- Not done, not indicated Extrem- cyanosis- none, clubbing, none, atrophy- none, strength- nl Neuro- grossly intact to observation

## 2023-03-28 ENCOUNTER — Encounter: Payer: Self-pay | Admitting: Internal Medicine

## 2023-03-28 ENCOUNTER — Ambulatory Visit: Payer: Medicare PPO | Admitting: Internal Medicine

## 2023-03-28 VITALS — BP 112/74 | HR 76 | Temp 97.2°F | Ht 64.0 in | Wt 229.0 lb

## 2023-03-28 DIAGNOSIS — J3089 Other allergic rhinitis: Secondary | ICD-10-CM

## 2023-03-28 DIAGNOSIS — J302 Other seasonal allergic rhinitis: Secondary | ICD-10-CM

## 2023-03-28 DIAGNOSIS — J4489 Other specified chronic obstructive pulmonary disease: Secondary | ICD-10-CM

## 2023-03-28 NOTE — Assessment & Plan Note (Signed)
Mild intermittent uncomplicated Occasional albuterol rescue inhaler currently sufficient

## 2023-03-28 NOTE — Assessment & Plan Note (Signed)
Suggest nasal saline rinse before Flonase Consider updating CT sinuses

## 2023-03-28 NOTE — Patient Instructions (Signed)
Ok to continue occasional albuterol inhaler if needed.  Try using an otc nasal saline rinse (not spray)- brands like NeilMed, or store brand. You can use this daily or when needed to help keep your sinuses draining normally.

## 2023-04-18 DIAGNOSIS — Z23 Encounter for immunization: Secondary | ICD-10-CM | POA: Diagnosis not present

## 2023-05-15 DIAGNOSIS — Z6841 Body Mass Index (BMI) 40.0 and over, adult: Secondary | ICD-10-CM | POA: Diagnosis not present

## 2023-05-15 DIAGNOSIS — Z713 Dietary counseling and surveillance: Secondary | ICD-10-CM | POA: Diagnosis not present

## 2023-06-24 DIAGNOSIS — Z713 Dietary counseling and surveillance: Secondary | ICD-10-CM | POA: Diagnosis not present

## 2023-06-24 DIAGNOSIS — Z6841 Body Mass Index (BMI) 40.0 and over, adult: Secondary | ICD-10-CM | POA: Diagnosis not present

## 2023-06-27 DIAGNOSIS — E559 Vitamin D deficiency, unspecified: Secondary | ICD-10-CM | POA: Diagnosis not present

## 2023-06-27 DIAGNOSIS — I10 Essential (primary) hypertension: Secondary | ICD-10-CM | POA: Diagnosis not present

## 2023-06-27 DIAGNOSIS — Z6841 Body Mass Index (BMI) 40.0 and over, adult: Secondary | ICD-10-CM | POA: Diagnosis not present

## 2023-06-27 DIAGNOSIS — E785 Hyperlipidemia, unspecified: Secondary | ICD-10-CM | POA: Diagnosis not present

## 2023-07-11 NOTE — Progress Notes (Deleted)
 77 y.o. G65P2002 Married African American female here for a breast and pelvic exam.    The patient is also followed for ***.  PCP: Cyrena Gwenn SQUIBB, MD (Inactive)   Patient's last menstrual period was 05/10/1990.           Sexually active: Yes.    The current method of family planning is hyst.    Menopausal hormone therapy: none Exercising: {yes no:314532}  {types:19826} Smoker: no  OB History     Gravida  2   Para  2   Term  2   Preterm      AB      Living  2      SAB      IAB      Ectopic      Multiple      Live Births  2           HEALTH MAINTENANCE: Last 2 paps: 2011 NEG History of abnormal Pap or positive HPV: no Mammogram:  10/22/22 Breast Density Cat B, BI-RADS CAT 1 neg Colonoscopy: 2016, Due? Bone Density:  12/10/22  Result  WNL  Immunization History  Administered Date(s) Administered   Influenza Split 05/27/2011, 04/10/2012, 04/14/2013, 04/15/2015, 04/15/2016, 04/14/2017   Influenza Whole 04/14/2009, 06/15/2010   Influenza, High Dose Seasonal PF 05/06/2016, 05/22/2017, 04/14/2018, 05/18/2018, 04/27/2019, 04/04/2020   Influenza,inj,Quad PF,6+ Mos 05/09/2014, 05/01/2015   PFIZER(Purple Top)SARS-COV-2 Vaccination 08/10/2019, 08/31/2019, 04/24/2020   Pneumococcal Conjugate-13 10/11/2014   Pneumococcal Polysaccharide-23 10/13/2015   Pneumococcal-Unspecified 10/14/2014      reports that she has never smoked. She has never used smokeless tobacco. She reports that she does not drink alcohol and does not use drugs.  Past Medical History:  Diagnosis Date   Acid reflux    Allergic rhinitis, cause unspecified    Arthritis    Asthma    Celiac disease    Chronic airway obstruction, not elsewhere classified    Family history of adverse reaction to anesthesia    patient's brother was 'slow' to wake up   GERD (gastroesophageal reflux disease)    Hypertension    Osteopenia 07/2015   T score -1.5 FRAX 4%/0.5%   Other chronic sinusitis    Other  hyperalimentation    Primary osteoarthritis of hip    Right   Unspecified asthma(493.90)    Urticaria    Vertigo     Past Surgical History:  Procedure Laterality Date   ABDOMINAL HYSTERECTOMY  1991   ABDOMINAL HYSTERECTOMY  leiomyomata   BREAST BIOPSY Left    BREAST SURGERY     Biopsy-benign   CATARACT EXTRACTION     RIGHT EYE   CESAREAN SECTION     CHOLECYSTECTOMY N/A 10/31/2014   Procedure: LAPAROSCOPIC CHOLECYSTECTOMY;  Surgeon: Donnice Bury, MD;  Location: [facility] OR;  Service: General;  Laterality: N/A;   DILATION AND CURETTAGE OF UTERUS     EYE SURGERY     DETACHED RETINA RIGHT EYE...    TOTAL ABDOMINAL HYSTERECTOMY W/ BILATERAL SALPINGOOPHORECTOMY     TOTAL HIP ARTHROPLASTY Right 09/02/2017   Procedure: TOTAL HIP ARTHROPLASTY ANTERIOR APPROACH;  Surgeon: Beverley Evalene BIRCH, MD;  Location: [facility] OR;  Service: Orthopedics;  Laterality: Right;    Current Outpatient Medications  Medication Sig Dispense Refill   acetaminophen  (TYLENOL ) 500 MG tablet Take 1 tablet (500 mg total) by mouth every 6 (six) hours as needed. 30 tablet 0   albuterol  (VENTOLIN  HFA) 108 (90 Base) MCG/ACT inhaler USE 2 PUFFS BY MOUTH EVERY 6  HOURS AS NEEDED FOR WHEEZING AND SHORTNESS OF BREATH 18 g 0   amLODipine  (NORVASC ) 2.5 MG tablet Take 2.5 mg by mouth daily.  3   Cholecalciferol (VITAMIN D3) 2000 units capsule Take 2,000 Units by mouth daily.      Cyanocobalamin  (VITAMIN B 12 PO) Take by mouth.     fexofenadine (ALLEGRA) 180 MG tablet Take 180 mg by mouth at bedtime.     fluticasone  (FLONASE ) 50 MCG/ACT nasal spray USE 2 SPRAYS IN EACH NOSTRIL TWICE DAILY AS NEEDED FOR CONGESTION 16 mL 3   irbesartan  (AVAPRO ) 150 MG tablet Take 150 mg by mouth daily.  1   KLOR-CON  M20 20 MEQ tablet Take 20 mEq by mouth daily.   3   omeprazole (PRILOSEC) 40 MG capsule Take 40 mg by mouth daily as needed (for acid reflux or heartburn).   1   rosuvastatin  (CRESTOR ) 10 MG tablet Take 10 mg by mouth daily.      spironolactone  (ALDACTONE ) 25 MG tablet Take 25 mg by mouth daily.  0   triamcinolone  (KENALOG) 0.025 % cream Apply 1 application topically 2 (two) times daily.     No current facility-administered medications for this visit.    ALLERGIES: Shellfish allergy , Atorvastatin, and Wheat  Family History  Problem Relation Age of Onset   Heart failure Mother    Heart disease Mother    Pancreatic cancer Father    Cancer Father 50       PANCREATIC (DECEASED)   Diabetes Brother        DEACESED.SABRA HAD A KIDNEY TRANSPLANT   Stroke Brother 43       DECEASED   Breast cancer Sister 47    Review of Systems  PHYSICAL EXAM:  LMP 05/10/1990     General appearance: alert, cooperative and appears stated age Head: normocephalic, without obvious abnormality, atraumatic Neck: no adenopathy, supple, symmetrical, trachea midline and thyroid normal to inspection and palpation Lungs: clear to auscultation bilaterally Breasts: normal appearance, no masses or tenderness, No nipple retraction or dimpling, No nipple discharge or bleeding, No axillary adenopathy Heart: regular rate and rhythm Abdomen: soft, non-tender; no masses, no organomegaly Extremities: extremities normal, atraumatic, no cyanosis or edema Skin: skin color, texture, turgor normal. No rashes or lesions Lymph nodes: cervical, supraclavicular, and axillary nodes normal. Neurologic: grossly normal  Pelvic: External genitalia:  no lesions              No abnormal inguinal nodes palpated.              Urethra:  normal appearing urethra with no masses, tenderness or lesions              Bartholins and Skenes: normal                 Vagina: normal appearing vagina with normal color and discharge, no lesions              Cervix: no lesions              Pap taken: {yes no:314532} Bimanual Exam:  Uterus:  normal size, contour, position, consistency, mobility, non-tender              Adnexa: no mass, fullness, tenderness              Rectal  exam: {yes no:314532}.  Confirms.              Anus:  normal sphincter tone, no lesions  Chaperone was present for  exam:  {BSCHAPERONE:[id number]::Linford Quintela F, CMA}  ASSESSMENT: Encounter for breast and pelvic exam.   ***  PLAN: Mammogram screening discussed. Self breast awareness reviewed. Pap and HRV collected:  {yes no:314532} Guidelines for Calcium , Vitamin D , regular exercise program including cardiovascular and weight bearing exercise. Medication refills:  *** {LABS (Optional):23779} Follow up:  ***    Additional counseling given.  {yes [id number]. ***  total time was spent for this patient encounter, including preparation, face-to-face counseling with the patient, coordination of care, and documentation of the encounter in addition to doing the breast and pelvic exam.

## 2023-07-21 ENCOUNTER — Encounter: Payer: Medicare PPO | Admitting: Obstetrics and Gynecology

## 2023-08-14 ENCOUNTER — Telehealth: Payer: Self-pay | Admitting: Internal Medicine

## 2023-08-14 ENCOUNTER — Other Ambulatory Visit: Payer: Self-pay

## 2023-08-14 DIAGNOSIS — J019 Acute sinusitis, unspecified: Secondary | ICD-10-CM

## 2023-08-14 MED ORDER — FLUTICASONE PROPIONATE 50 MCG/ACT NA SUSP: NASAL | 3 refills | Status: AC

## 2023-08-14 NOTE — Telephone Encounter (Signed)
Refill sent. Pt notified. Nfn.

## 2023-08-14 NOTE — Telephone Encounter (Signed)
PT needs refill of Flonase  [facility] [address]

## 2023-08-15 NOTE — Telephone Encounter (Signed)
 nfn

## 2023-08-28 DIAGNOSIS — K900 Celiac disease: Secondary | ICD-10-CM | POA: Diagnosis not present

## 2023-08-28 DIAGNOSIS — Z6841 Body Mass Index (BMI) 40.0 and over, adult: Secondary | ICD-10-CM | POA: Diagnosis not present

## 2023-09-30 DIAGNOSIS — E66813 Obesity, class 3: Secondary | ICD-10-CM | POA: Diagnosis not present

## 2023-09-30 DIAGNOSIS — S29011A Strain of muscle and tendon of front wall of thorax, initial encounter: Secondary | ICD-10-CM | POA: Diagnosis not present

## 2023-10-09 NOTE — Progress Notes (Deleted)
 78 y.o. G49P2002 Married [place] American female here for a breast and pelvic exam.    The patient is also followed for ***.  PCP: Ileana Ladd, MD (Inactive)   Patient's last menstrual period was 05/10/1990.           Sexually active: Yes.    The current method of family planning is status post hysterectomy.    Menopausal hormone therapy:  n/a Exercising: {yes no:314532}  {types:19826} Smoker:  no  OB History     Gravida  2   Para  2   Term  2   Preterm      AB      Living  2      SAB      IAB      Ectopic      Multiple      Live Births  2           HEALTH MAINTENANCE: Last 2 paps: *** History of abnormal Pap or positive HPV:  {YES NO:22349} Mammogram:  *** Colonoscopy:   Bone Density:  ***  Result  ***   Immunization History  Administered Date(s) Administered   Influenza Split 05/27/2011, 04/10/2012, 04/14/2013, 04/15/2015, 04/15/2016, 04/14/2017   Influenza Whole 04/14/2009, 06/15/2010   Influenza, High Dose Seasonal PF 05/06/2016, 05/22/2017, 04/14/2018, 05/18/2018, 04/27/2019, 04/04/2020   Influenza,inj,Quad PF,6+ Mos 05/09/2014, 05/01/2015   PFIZER(Purple Top)SARS-COV-2 Vaccination 08/10/2019, 08/31/2019, 04/24/2020   Pneumococcal Conjugate-13 10/11/2014   Pneumococcal Polysaccharide-23 10/13/2015   Pneumococcal-Unspecified 10/14/2014      reports that she has never smoked. She has never used smokeless tobacco. She reports that she does not drink alcohol and does not use drugs.  Past Medical History:  Diagnosis Date   Acid reflux    Allergic rhinitis, cause unspecified    Arthritis    Asthma    Celiac disease    Chronic airway obstruction, not elsewhere classified    Family history of adverse reaction to anesthesia    "patient's brother was 'slow' to wake up"   GERD (gastroesophageal reflux disease)    Hypertension    Osteopenia 07/2015   T score -1.5 FRAX 4%/0.5%   Other chronic sinusitis    Other hyperalimentation     Primary osteoarthritis of hip    Right   Unspecified asthma(493.90)    Urticaria    Vertigo     Past Surgical History:  Procedure Laterality Date   ABDOMINAL HYSTERECTOMY  1991   ABDOMINAL HYSTERECTOMY  leiomyomata   BREAST BIOPSY Left    BREAST SURGERY     Biopsy-benign   CATARACT EXTRACTION     RIGHT EYE   CESAREAN SECTION     CHOLECYSTECTOMY N/A 10/31/2014   Procedure: LAPAROSCOPIC CHOLECYSTECTOMY;  Surgeon: Emelia Loron, MD;  Location: [facility] OR;  Service: General;  Laterality: N/A;   DILATION AND CURETTAGE OF UTERUS     EYE SURGERY     DETACHED RETINA RIGHT EYE...    TOTAL ABDOMINAL HYSTERECTOMY W/ BILATERAL SALPINGOOPHORECTOMY     TOTAL HIP ARTHROPLASTY Right 09/02/2017   Procedure: TOTAL HIP ARTHROPLASTY ANTERIOR APPROACH;  Surgeon: Sheral Apley, MD;  Location: [facility] OR;  Service: Orthopedics;  Laterality: Right;    Current Outpatient Medications  Medication Sig Dispense Refill   acetaminophen (TYLENOL) 500 MG tablet Take 1 tablet (500 mg total) by mouth every 6 (six) hours as needed. 30 tablet 0   albuterol (VENTOLIN HFA) 108 (90 Base) MCG/ACT inhaler USE 2 PUFFS BY MOUTH EVERY 6 HOURS AS  NEEDED FOR WHEEZING AND SHORTNESS OF BREATH 18 g 0   amLODipine (NORVASC) 2.5 MG tablet Take 2.5 mg by mouth daily.  3   Cholecalciferol (VITAMIN D3) 2000 units capsule Take 2,000 Units by mouth daily.      Cyanocobalamin (VITAMIN B 12 PO) Take by mouth.     fexofenadine (ALLEGRA) 180 MG tablet Take 180 mg by mouth at bedtime.     fluticasone (FLONASE) 50 MCG/ACT nasal spray Use 2 sprays in each nostril twice daily as needed for congestion 16 mL 3   irbesartan (AVAPRO) 150 MG tablet Take 150 mg by mouth daily.  1   KLOR-CON M20 20 MEQ tablet Take 20 mEq by mouth daily.   3   omeprazole (PRILOSEC) 40 MG capsule Take 40 mg by mouth daily as needed (for acid reflux or heartburn).   1   rosuvastatin (CRESTOR) 10 MG tablet Take 10 mg by mouth daily.     spironolactone (ALDACTONE) 25 MG  tablet Take 25 mg by mouth daily.  0   triamcinolone (KENALOG) 0.025 % cream Apply 1 application topically 2 (two) times daily.     No current facility-administered medications for this visit.    ALLERGIES: Shellfish allergy, Atorvastatin, and Wheat  Family History  Problem Relation Age of Onset   Heart failure Mother    Heart disease Mother    Pancreatic cancer Father    Cancer Father 51       PANCREATIC (DECEASED)   Diabetes Brother        DEACESED.Marland Kitchen HAD A KIDNEY TRANSPLANT   Stroke Brother 46       DECEASED   Breast cancer Sister 40    Review of Systems  PHYSICAL EXAM:  LMP 05/10/1990     General appearance: alert, cooperative and appears stated age Head: normocephalic, without obvious abnormality, atraumatic Neck: no adenopathy, supple, symmetrical, trachea midline and thyroid normal to inspection and palpation Lungs: clear to auscultation bilaterally Breasts: normal appearance, no masses or tenderness, No nipple retraction or dimpling, No nipple discharge or bleeding, No axillary adenopathy Heart: regular rate and rhythm Abdomen: soft, non-tender; no masses, no organomegaly Extremities: extremities normal, atraumatic, no cyanosis or edema Skin: skin color, texture, turgor normal. No rashes or lesions Lymph nodes: cervical, supraclavicular, and axillary nodes normal. Neurologic: grossly normal  Pelvic: External genitalia:  no lesions              No abnormal inguinal nodes palpated.              Urethra:  normal appearing urethra with no masses, tenderness or lesions              Bartholins and Skenes: normal                 Vagina: normal appearing vagina with normal color and discharge, no lesions              Cervix: no lesions              Pap taken: {yes no:314532} Bimanual Exam:  Uterus:  normal size, contour, position, consistency, mobility, non-tender              Adnexa: no mass, fullness, tenderness              Rectal exam: {yes no:314532}.  Confirms.               Anus:  normal sphincter tone, no lesions  Chaperone was present for exam:  {  BSCHAPERONE:31226::"Shunta Mclaurin F, CMA"}  ASSESSMENT: Encounter for breast and pelvic exam.   ***  PLAN: Mammogram screening discussed. Self breast awareness reviewed. Pap and HRV collected:  {yes no:314532} Guidelines for Calcium, Vitamin D, regular exercise program including cardiovascular and weight bearing exercise. Medication refills:  *** {LABS (Optional):23779} Follow up:  ***    Additional counseling given.  {yes [id number]. ***  total time was spent for this patient encounter, including preparation, face-to-face counseling with the patient, coordination of care, and documentation of the encounter in addition to doing the breast and pelvic exam.

## 2023-10-13 ENCOUNTER — Other Ambulatory Visit: Payer: Self-pay | Admitting: Obstetrics and Gynecology

## 2023-10-13 DIAGNOSIS — Z0000 Encounter for general adult medical examination without abnormal findings: Secondary | ICD-10-CM

## 2023-10-21 DIAGNOSIS — Z961 Presence of intraocular lens: Secondary | ICD-10-CM | POA: Diagnosis not present

## 2023-10-21 DIAGNOSIS — H26493 Other secondary cataract, bilateral: Secondary | ICD-10-CM | POA: Diagnosis not present

## 2023-10-21 DIAGNOSIS — H524 Presbyopia: Secondary | ICD-10-CM | POA: Diagnosis not present

## 2023-10-21 DIAGNOSIS — H31001 Unspecified chorioretinal scars, right eye: Secondary | ICD-10-CM | POA: Diagnosis not present

## 2023-10-23 ENCOUNTER — Encounter: Payer: Medicare PPO | Admitting: Obstetrics and Gynecology

## 2023-10-23 ENCOUNTER — Ambulatory Visit
Admission: RE | Admit: 2023-10-23 | Discharge: 2023-10-23 | Disposition: A | Discharge status: Home / Self Care | Source: Ambulatory Visit | Attending: Obstetrics and Gynecology | Admitting: Obstetrics and Gynecology

## 2023-10-23 DIAGNOSIS — Z1231 Encounter for screening mammogram for malignant neoplasm of breast: Secondary | ICD-10-CM | POA: Diagnosis not present

## 2023-10-23 DIAGNOSIS — Z0000 Encounter for general adult medical examination without abnormal findings: Secondary | ICD-10-CM

## 2023-10-28 ENCOUNTER — Encounter: Payer: Self-pay | Admitting: Obstetrics and Gynecology

## 2023-11-14 DIAGNOSIS — Z6841 Body Mass Index (BMI) 40.0 and over, adult: Secondary | ICD-10-CM | POA: Diagnosis not present

## 2023-11-14 DIAGNOSIS — Z713 Dietary counseling and surveillance: Secondary | ICD-10-CM | POA: Diagnosis not present

## 2023-11-14 DIAGNOSIS — S29011D Strain of muscle and tendon of front wall of thorax, subsequent encounter: Secondary | ICD-10-CM | POA: Diagnosis not present

## 2023-11-14 DIAGNOSIS — E66813 Obesity, class 3: Secondary | ICD-10-CM | POA: Diagnosis not present

## 2023-12-05 DIAGNOSIS — K900 Celiac disease: Secondary | ICD-10-CM | POA: Diagnosis not present

## 2023-12-05 DIAGNOSIS — Z6841 Body Mass Index (BMI) 40.0 and over, adult: Secondary | ICD-10-CM | POA: Diagnosis not present

## 2023-12-31 ENCOUNTER — Encounter (HOSPITAL_BASED_OUTPATIENT_CLINIC_OR_DEPARTMENT_OTHER): Payer: Self-pay

## 2023-12-31 DIAGNOSIS — M7502 Adhesive capsulitis of left shoulder: Secondary | ICD-10-CM | POA: Diagnosis not present

## 2024-01-06 DIAGNOSIS — M7502 Adhesive capsulitis of left shoulder: Secondary | ICD-10-CM | POA: Diagnosis not present

## 2024-01-09 DIAGNOSIS — Z23 Encounter for immunization: Secondary | ICD-10-CM | POA: Diagnosis not present

## 2024-01-09 DIAGNOSIS — E559 Vitamin D deficiency, unspecified: Secondary | ICD-10-CM | POA: Diagnosis not present

## 2024-01-09 DIAGNOSIS — E538 Deficiency of other specified B group vitamins: Secondary | ICD-10-CM | POA: Diagnosis not present

## 2024-01-09 DIAGNOSIS — Z0000 Encounter for general adult medical examination without abnormal findings: Secondary | ICD-10-CM | POA: Diagnosis not present

## 2024-01-09 DIAGNOSIS — E785 Hyperlipidemia, unspecified: Secondary | ICD-10-CM | POA: Diagnosis not present

## 2024-01-09 DIAGNOSIS — Z6841 Body Mass Index (BMI) 40.0 and over, adult: Secondary | ICD-10-CM | POA: Diagnosis not present

## 2024-01-09 DIAGNOSIS — R42 Dizziness and giddiness: Secondary | ICD-10-CM | POA: Diagnosis not present

## 2024-01-09 DIAGNOSIS — M8580 Other specified disorders of bone density and structure, unspecified site: Secondary | ICD-10-CM | POA: Diagnosis not present

## 2024-01-13 DIAGNOSIS — M7502 Adhesive capsulitis of left shoulder: Secondary | ICD-10-CM | POA: Diagnosis not present

## 2024-01-14 ENCOUNTER — Other Ambulatory Visit (HOSPITAL_BASED_OUTPATIENT_CLINIC_OR_DEPARTMENT_OTHER): Payer: Self-pay | Admitting: Family Medicine

## 2024-01-14 DIAGNOSIS — M8588 Other specified disorders of bone density and structure, other site: Secondary | ICD-10-CM

## 2024-01-20 DIAGNOSIS — M7502 Adhesive capsulitis of left shoulder: Secondary | ICD-10-CM | POA: Diagnosis not present

## 2024-01-27 DIAGNOSIS — M7502 Adhesive capsulitis of left shoulder: Secondary | ICD-10-CM | POA: Diagnosis not present

## 2024-02-03 DIAGNOSIS — M7502 Adhesive capsulitis of left shoulder: Secondary | ICD-10-CM | POA: Diagnosis not present

## 2024-02-10 DIAGNOSIS — M7502 Adhesive capsulitis of left shoulder: Secondary | ICD-10-CM | POA: Diagnosis not present

## 2024-02-16 ENCOUNTER — Telehealth: Payer: Self-pay | Admitting: Internal Medicine

## 2024-02-16 NOTE — Telephone Encounter (Signed)
 Patient is dropping off Disability Parking Placard for Dr. Neysa to fill out. She can be called when it ready for pick up. [phone]

## 2024-02-17 DIAGNOSIS — M7502 Adhesive capsulitis of left shoulder: Secondary | ICD-10-CM | POA: Diagnosis not present

## 2024-02-23 ENCOUNTER — Ambulatory Visit: Admitting: Internal Medicine

## 2024-02-23 NOTE — Telephone Encounter (Signed)
 Received form back from Dr. Neysa.  Form filled out.  Called patient.  Patient states she will come by clinic to pick up today or tomorrow, 02/24/2024.  Form placed at front desk.

## 2024-02-24 DIAGNOSIS — M7502 Adhesive capsulitis of left shoulder: Secondary | ICD-10-CM | POA: Diagnosis not present

## 2024-03-02 DIAGNOSIS — M7502 Adhesive capsulitis of left shoulder: Secondary | ICD-10-CM | POA: Diagnosis not present

## 2024-03-09 DIAGNOSIS — M7502 Adhesive capsulitis of left shoulder: Secondary | ICD-10-CM | POA: Diagnosis not present

## 2024-03-24 NOTE — Progress Notes (Unsigned)
 Patient ID: Bonnie Ellison, female    DOB: 1945-09-03, 78 y.o.   MRN: [id number]  HPI F never smoker followed for chronic sinusitis, rhinitis, chronic obstructive asthma, obesity hypoventilation Office Spirometry 06/07/2016-moderate obstructive airways disease with restriction of exhaled volume. FVC 1.59/70%, FEV1 1.15/65%, ratio 0.72, FEF 25-75% 0.85/51%  ---------------------------------------------------------------------------------------   03/27/23- 78 year old female never smoker followed for chronic sinusitis, rhinitis, chronic obstructive asthma, Obesity/hypoventilation, complicated by Celiac disease, HTN, -Symbicort  80, Proair  HFA, Flonase  Flu vax--planned for October -----Breathing has been good  Occasional fullness in sinuses and yellow after using Flonase . Rarely headache. Uses only albuterol  inhaler- about 1x/week. Not using Symbicort . Unfamiliar with nasal saline rinse.  03/25/24-  78 year old female never smoker followed for chronic sinusitis, rhinitis, chronic obstructive asthma, Obesity/hypoventilation, complicated by Celiac disease, HTN, -Symbicort  80, Proair  HFA, Flonase , Allegra -----Runny nose and runny eyes with PND Discussed the use of AI scribe software for clinical note transcription with the patient, who gave verbal consent to proceed.  History of Present Illness   Bonnie Ellison is a 78 year old female with chronic sinus disease who presents with sinus pressure and dental discomfort.  She experiences persistent sinus pressure and soreness around her upper teeth, describing the sensation as 'stinky' and 'exhausted'. Her eyes are watery, and she feels that her sinus drainage is poor, describing it as 'stopped up for years'. She manages her symptoms with over-the-counter nasal lavage, antihistamines such as Allegra, and Flonase . No significant chest discomfort or coughing. We are updating lab for Eos and IgE, exploring again whether a Biologic agent could help.  She has not wanted sinus surgery.     Assessment and Plan:    Chronic sinusitis and allergic rhinitis Chronic sinusitis and allergic rhinitis with persistent symptoms. Previous sinus surgery declined. Current management includes Flonase  and Allegra. No nasal polyps observed. - Continue Flonase  and Allegra. - Consider Zyrtec or Claritin  if current antihistamine is ineffective. - Reintroduce Singulair  to evaluate effectiveness. - Use nasal lavage with saline solution for symptom relief. - Check allergy  markers to guide further management.     Review of Systems-see HPI + = positive Constitutional:   No-   weight loss, night sweats, fevers, chills, fatigue, lassitude. HEENT:    headaches,  No-difficulty swallowing, tooth/dental problems, sore throat,       No-  sneezing, itching, ear ache, +nasal congestion, + post nasal drip,  CV:  No-   chest pain, orthopnea, PND, swelling in lower extremities, anasarca, dizziness, palpitations Resp: Increased shortness of breath with exertion or at rest.              No-   productive cough,  + non-productive cough,  No- coughing up of blood.              No-   change in color of mucus.  Little recent wheezing.   Skin: No-   rash or lesions. No hives. GI:  No-   heartburn, indigestion, abdominal pain, nausea, vomiting,  GU:  MS:  No-   joint pain or swelling.   Neuro-     nothing unusual Psych:  No- change in mood or affect. No depression or anxiety.  No memory loss. Objective:   Physical Exam General- Alert, Oriented, Affect-appropriate, Distress- none acute,            + obese Skin- rash-none, lesions- none, excoriation- none Lymphadenopathy- none Head- atraumatic            Eyes- Gross vision intact,  PERRLA, +strabismus/ R eye dev laterally, +Chronic periorbital edema            Ears- Hearing, canals-normal, TMs clear            Nose- turbinate edema, no-Septal dev, mucus, polyps, erosion, perforation             Throat- Mallampati II , mucosa  clear , drainage- none, tonsils- atrophic, clear w/o swelling. Much dental repair. Neck- flexible , trachea midline, no stridor , thyroid nl, carotid no bruit Chest - symmetrical excursion , unlabored           Heart/CV- RRR , no murmur , no gallop  , no rub, nl s1 s2                           - JVD- none , edema- none, stasis changes- none, varices- none           Lung-  clear , cough- none , dullness-none, rub- none           Chest wall-  Abd- Br/ Gen/ Rectal- Not done, not indicated Extrem- cyanosis- none, clubbing, none, atrophy- none, strength- nl Neuro- grossly intact to observation

## 2024-03-25 ENCOUNTER — Ambulatory Visit: Admitting: Internal Medicine

## 2024-03-25 ENCOUNTER — Encounter: Payer: Self-pay | Admitting: Internal Medicine

## 2024-03-25 VITALS — BP 134/64 | HR 87 | Temp 97.8°F | Ht 62.5 in | Wt 216.6 lb

## 2024-03-25 DIAGNOSIS — J329 Chronic sinusitis, unspecified: Secondary | ICD-10-CM | POA: Diagnosis not present

## 2024-03-25 DIAGNOSIS — J309 Allergic rhinitis, unspecified: Secondary | ICD-10-CM | POA: Diagnosis not present

## 2024-03-25 MED ORDER — MONTELUKAST SODIUM 10 MG PO TABS: 10.0000 mg | ORAL_TABLET | Freq: Every day | ORAL | 11 refills | Status: DC

## 2024-03-25 NOTE — Patient Instructions (Signed)
 Order- lab- CBC w diff, IgE     dx Chronic sinusitis  Script sent for Singulair - to see if it helps your sinuses  Ok to take an antihistamine like allegra, claritin , zyrtec or xyzal  Try using an otc nasal saline rinse navage like NeilMed brand, once a day for a while when needed.

## 2024-03-26 ENCOUNTER — Encounter: Payer: Self-pay | Admitting: Internal Medicine

## 2024-03-26 LAB — CBC WITH DIFFERENTIAL/PLATELET
Basophils Absolute: 0 K/uL (ref 0.0–0.1)
Basophils Relative: 0.3 % (ref 0.0–3.0)
Eosinophils Absolute: 0.2 K/uL (ref 0.0–0.7)
Eosinophils Relative: 3.9 % (ref 0.0–5.0)
HCT: 38.3 % (ref 36.0–46.0)
Hemoglobin: 12.8 g/dL (ref 12.0–15.0)
Lymphocytes Relative: 28.7 % (ref 12.0–46.0)
Lymphs Abs: 1.8 K/uL (ref 0.7–4.0)
MCHC: 33.5 g/dL (ref 30.0–36.0)
MCV: 87.3 fl (ref 78.0–100.0)
Monocytes Absolute: 0.8 K/uL (ref 0.1–1.0)
Monocytes Relative: 12.7 % — ABNORMAL HIGH (ref 3.0–12.0)
Neutro Abs: 3.4 K/uL (ref 1.4–7.7)
Neutrophils Relative %: 54.4 % (ref 43.0–77.0)
Platelets: 190 K/uL (ref 150.0–400.0)
RBC: 4.39 Mil/uL (ref 3.87–5.11)
RDW: 14.3 % (ref 11.5–15.5)
WBC: 6.3 K/uL (ref 4.0–10.5)

## 2024-03-26 LAB — IGE: IgE (Immunoglobulin E), Serum: 670 kU/L — ABNORMAL HIGH (ref <=114)

## 2024-04-01 ENCOUNTER — Ambulatory Visit: Payer: Self-pay | Admitting: Internal Medicine

## 2024-04-06 NOTE — Telephone Encounter (Signed)
 Copied from CRM [id number]. Topic: Clinical - Medication Question >> Apr 05, 2024  3:03 PM Russell PARAS wrote: Reason for CRM:   Pt started taking Singulair  on Friday and had vivid nightmares every night this weekend. Would like to speak to nurse about possibly discontinuing medication due to the side effects.   CB#  [phone]  Looks like patient is having nightmares due to singular,please advise on medication side effect

## 2024-04-07 NOTE — Telephone Encounter (Signed)
 Yes- stop Singulair . Let us  know if breathing gets worse.

## 2024-04-29 DIAGNOSIS — Z23 Encounter for immunization: Secondary | ICD-10-CM | POA: Diagnosis not present

## 2024-04-30 DIAGNOSIS — K900 Celiac disease: Secondary | ICD-10-CM | POA: Diagnosis not present

## 2024-04-30 DIAGNOSIS — J45909 Unspecified asthma, uncomplicated: Secondary | ICD-10-CM | POA: Diagnosis not present

## 2024-04-30 DIAGNOSIS — K219 Gastro-esophageal reflux disease without esophagitis: Secondary | ICD-10-CM | POA: Diagnosis not present

## 2024-04-30 DIAGNOSIS — E785 Hyperlipidemia, unspecified: Secondary | ICD-10-CM | POA: Diagnosis not present

## 2024-04-30 DIAGNOSIS — M1990 Unspecified osteoarthritis, unspecified site: Secondary | ICD-10-CM | POA: Diagnosis not present

## 2024-04-30 DIAGNOSIS — I10 Essential (primary) hypertension: Secondary | ICD-10-CM | POA: Diagnosis not present

## 2024-04-30 DIAGNOSIS — Z8249 Family history of ischemic heart disease and other diseases of the circulatory system: Secondary | ICD-10-CM | POA: Diagnosis not present

## 2024-04-30 DIAGNOSIS — Z833 Family history of diabetes mellitus: Secondary | ICD-10-CM | POA: Diagnosis not present

## 2024-05-11 DIAGNOSIS — K900 Celiac disease: Secondary | ICD-10-CM | POA: Diagnosis not present

## 2024-05-11 DIAGNOSIS — E538 Deficiency of other specified B group vitamins: Secondary | ICD-10-CM | POA: Diagnosis not present

## 2024-05-11 DIAGNOSIS — M8580 Other specified disorders of bone density and structure, unspecified site: Secondary | ICD-10-CM | POA: Diagnosis not present

## 2024-05-11 DIAGNOSIS — Z6841 Body Mass Index (BMI) 40.0 and over, adult: Secondary | ICD-10-CM | POA: Diagnosis not present

## 2024-05-11 DIAGNOSIS — E559 Vitamin D deficiency, unspecified: Secondary | ICD-10-CM | POA: Diagnosis not present

## 2024-06-23 NOTE — Progress Notes (Unsigned)
 Patient ID: Bonnie Ellison, female    DOB: [birth year], 78 y.o.   MRN: [id number]  HPI F never smoker followed for chronic sinusitis, rhinitis, chronic obstructive asthma, obesity hypoventilation Office Spirometry 06/07/2016-moderate obstructive airways disease with restriction of exhaled volume. FVC 1.59/70%, FEV1 1.15/65%, ratio 0.72, FEF 25-75% 0.85/51%  ---------------------------------------------------------------------------------------   03/25/24-  78 year old female never smoker followed for chronic sinusitis, rhinitis, chronic obstructive asthma, Obesity/hypoventilation, complicated by Celiac disease, HTN, -Symbicort  80, Proair  HFA, Flonase , Allegra -----Runny nose and runny eyes with PND Discussed the use of AI scribe software for clinical note transcription with the patient, who gave verbal consent to proceed.  History of Present Illness   Bonnie Ellison is a 78 year old female with chronic sinus disease who presents with sinus pressure and dental discomfort.  She experiences persistent sinus pressure and soreness around her upper teeth, describing the sensation as 'stinky' and 'exhausted'. Her eyes are watery, and she feels that her sinus drainage is poor, describing it as 'stopped up for years'. She manages her symptoms with over-the-counter nasal lavage, antihistamines such as Allegra, and Flonase . No significant chest discomfort or coughing. We are updating lab for Eos and IgE, exploring again whether a Biologic agent could help. She has not wanted sinus surgery.     Assessment and Plan:    Chronic sinusitis and allergic rhinitis Chronic sinusitis and allergic rhinitis with persistent symptoms. Previous sinus surgery declined. Current management includes Flonase  and Allegra. No nasal polyps observed. - Continue Flonase  and Allegra. - Consider Zyrtec or Claritin  if current antihistamine is ineffective. - Reintroduce Singulair  to evaluate effectiveness. - Use nasal  lavage with saline solution for symptom relief. - Check allergy  markers to guide further management   06/24/24- 78 year old female never smoker followed for chronic sinusitis, rhinitis, chronic obstructive asthma, Obesity/hypoventilation, complicated by Celiac disease, HTN, -Symbicort  80, Proair  HFA, Flonase , Allegra, Singulair , saline navage,  No significant chest discomfort or coughing. We updated lab for Eos and IgE, exploring again whether a Biologic agent could help. She has not wanted sinus surgery.  Lab- 03/25/24- EOS 0.2, IgE 670 H,        Consider Biologic        Review of Systems-see HPI + = positive Constitutional:   No-   weight loss, night sweats, fevers, chills, fatigue, lassitude. HEENT:    headaches,  No-difficulty swallowing, tooth/dental problems, sore throat,       No-  sneezing, itching, ear ache, +nasal congestion, + post nasal drip,  CV:  No-   chest pain, orthopnea, PND, swelling in lower extremities, anasarca, dizziness, palpitations Resp: Increased shortness of breath with exertion or at rest.              No-   productive cough,  + non-productive cough,  No- coughing up of blood.              No-   change in color of mucus.  Little recent wheezing.   Skin: No-   rash or lesions. No hives. GI:  No-   heartburn, indigestion, abdominal pain, nausea, vomiting,  GU:  MS:  No-   joint pain or swelling.   Neuro-     nothing unusual Psych:  No- change in mood or affect. No depression or anxiety.  No memory loss. Objective:   Physical Exam General- Alert, Oriented, Affect-appropriate, Distress- none acute,            + obese Skin- rash-none, lesions- none, excoriation- none  Lymphadenopathy- none Head- atraumatic            Eyes- Gross vision intact, PERRLA, +strabismus/ R eye dev laterally, +Chronic periorbital edema            Ears- Hearing, canals-normal, TMs clear            Nose- turbinate edema, no-Septal dev, mucus, polyps, erosion, perforation              Throat- Mallampati II , mucosa clear , drainage- none, tonsils- atrophic, clear w/o swelling. Much dental repair. Neck- flexible , trachea midline, no stridor , thyroid nl, carotid no bruit Chest - symmetrical excursion , unlabored           Heart/CV- RRR , no murmur , no gallop  , no rub, nl s1 s2                           - JVD- none , edema- none, stasis changes- none, varices- none           Lung-  clear , cough- none , dullness-none, rub- none           Chest wall-  Abd- Br/ Gen/ Rectal- Not done, not indicated Extrem- cyanosis- none, clubbing, none, atrophy- none, strength- nl Neuro- grossly intact to observation

## 2024-06-24 ENCOUNTER — Ambulatory Visit: Admitting: Internal Medicine

## 2024-06-24 ENCOUNTER — Encounter: Payer: Self-pay | Admitting: Internal Medicine

## 2024-06-24 VITALS — BP 126/78 | HR 80 | Temp 97.3°F | Ht 63.0 in | Wt 216.0 lb

## 2024-06-24 DIAGNOSIS — J3089 Other allergic rhinitis: Secondary | ICD-10-CM

## 2024-06-24 DIAGNOSIS — J329 Chronic sinusitis, unspecified: Secondary | ICD-10-CM

## 2024-06-24 NOTE — Patient Instructions (Signed)
 Order- Apply for Dupixent   dx   Chronic rhinitis, Sinusitis, elevated IgE  Continued meds   Please call if we can help

## 2024-06-29 ENCOUNTER — Encounter: Payer: Self-pay | Admitting: Obstetrics and Gynecology

## 2024-06-29 ENCOUNTER — Telehealth: Payer: Self-pay

## 2024-06-29 ENCOUNTER — Other Ambulatory Visit (HOSPITAL_COMMUNITY): Payer: Self-pay

## 2024-06-29 ENCOUNTER — Ambulatory Visit: Admitting: Obstetrics and Gynecology

## 2024-06-29 VITALS — BP 126/84 | HR 83 | Ht 63.0 in | Wt 214.0 lb

## 2024-06-29 DIAGNOSIS — Z01419 Encounter for gynecological examination (general) (routine) without abnormal findings: Secondary | ICD-10-CM

## 2024-06-29 DIAGNOSIS — J4489 Other specified chronic obstructive pulmonary disease: Secondary | ICD-10-CM

## 2024-06-29 NOTE — Patient Instructions (Signed)
 Calcium in Foods Calcium is a mineral in the body. Of all the minerals in your body, you have the most calcium. Most of the body's calcium supply is stored in bones and teeth. Calcium helps many parts of the body work, including: Blood and blood vessels. Nerves. Hormones. Muscles. Bones and teeth. When your calcium stores are low, you may be at risk for low bone mass, bone loss, and broken bones. When you get enough calcium, it helps to support strong bones and teeth throughout your life. Calcium is especially important for: Children during growth spurts. Females during adolescence. Females who are pregnant or breastfeeding. Females after their menstrual cycle stops (postmenopausal). Females whose menstrual cycle has stopped because of an eating disorder or regular intense exercise. People who can't eat or digest dairy products. People who eat a vegan diet. Recommended daily amounts of calcium: Females (ages 87 to 55): 1,000 mg per day. Females (ages 35 and older): 1,200 mg per day. Males (ages 53 to 45): 1,000 mg per day. Males (ages 43 and older): 1,200 mg per day. Females (ages 88 to 28): 1,300 mg per day. Males (ages 41 to 56): 1,300 mg per day. General information Eat foods that are high in calcium. Try to get most of your calcium from food. Some people may benefit from taking calcium supplements. Check with your health care provider or an expert in healthy eating called a dietitian before starting any calcium supplements. Calcium supplements may interact with certain medicines. Too much calcium may cause other health problems, such as trouble pooping and kidney stones. For the body to absorb calcium, it needs vitamin D. Sources of vitamin D include: Skin exposure to direct sunlight. Foods, such as egg yolks, liver, mushrooms, saltwater fish, and fortified milk. Vitamin D supplements. Check with your provider or dietitian before starting any vitamin D supplements. The amount of  calcium that is absorbed in the body varies with type of food. Talk to a dietitian about what foods are best for you, especially if you are eat a vegan diet or don't eat dairy. What foods are high in calcium?  Foods that are high in calcium contain more than 100 milligrams per serving. Fruits Fortified orange juice or other fruit juice, 300 mg per 8 oz (237 mL) serving. Vegetables Collard greens, 260 mg per 1 cup (130 g) serving, cooked. Kale, 180 mg per 1 cup (118 g) serving, cooked. Bok choy, 180 mg per 1 cup (170 g) serving, cooked Grains Fortified frozen waffles, 200 mg in 2 waffles. Oatmeal, 180 mg in 1 cup (234 g) serving, cooked. Fortified white bread, 175 mg per slice. Meats and other proteins Sardines, canned with bones, 350 mg per 3.75 oz (92 g) serving. Salmon, canned with bones, 168 mg per 3 oz (85 g) serving. Canned shrimp, 125 mg per 3 oz (85 g) serving. Baked beans, 120 mg per 1 cup (266 g) serving. Tofu, firm, made with calcium sulfate, 861 mg per  cup (126 g) serving. Dairy Yogurt, plain, low-fat, 448 mg per 1 cup (245 g) serving Nonfat milk, 300 mg per 1 cup (245 g) serving. American cheese, 145 mg per 1 oz (21 g) serving or 1 slice. Cheddar cheese, 200 mg per 1 oz (28 g) serving or 1 slice. Cottage cheese 2%, 125 mg per  cup (113 g) serving. Fortified soy, rice, or almond milk, 300 mg per 1 cup (237 mL) serving. Mozzarella, part skim, 210 mg per 1 oz (21 g) serving. The items listed  above may not be a complete list of foods high in calcium. Actual amounts of calcium may be different depending on processing. Contact a dietitian for more information. What foods are lower in calcium? Foods that are lower in calcium contain 50 mg or less per serving. Fruits Apple, 1 medium, about 6 mg. Banana, 1 medium, about 12 mg. Vegetables Lettuce, 19 mg per 1 cup (35 g) serving. Tomato, 1 small, about 11 mg. Grains Rice, white, 8 mg per  cup (79 g) serving. Boiled  potatoes, 14 mg per 1 cup (160 g) serving. White bread, 6 mg per slice. Meats and other proteins Egg, 24 mg per 1 egg (50 g). Red meat, 7 mg per 4 oz (80 g) serving. Chicken, 17 mg per 4 oz (113 g) serving. Fish, cod, or trout, 20 mg per 4 oz (140 g) serving. Dairy Cream cheese, regular, 14 mg per 1 Tbsp (15 g) serving. Brie cheese, 50 mg per 1 oz (32 g) serving. The items listed above may not be a complete list of foods lower in calcium. Actual amounts of calcium may be different depending on processing. Contact a dietitian for more information. This information is not intended to replace advice given to you by your health care provider. Make sure you discuss any questions you have with your health care provider. Document Revised: 03/29/2023 Document Reviewed: 03/29/2023 Elsevier Patient Education  2024 Elsevier Inc.  EXERCISE AND DIET:  We recommended that you start or continue a regular exercise program for good health. Regular exercise means any activity that makes your heart beat faster and makes you sweat.  We recommend exercising at least 30 minutes per day at least 3 days a week, preferably 4 or 5.  We also recommend a diet low in fat and sugar.  Inactivity, poor dietary choices and obesity can cause diabetes, heart attack, stroke, and kidney damage, among others.    ALCOHOL AND SMOKING:  Women should limit their alcohol intake to no more than 7 drinks/beers/glasses of wine (combined, not each!) per week. Moderation of alcohol intake to this level decreases your risk of breast cancer and liver damage. And of course, no recreational drugs are part of a healthy lifestyle.  And absolutely no smoking or even second hand smoke. Most people know smoking can cause heart and lung diseases, but did you know it also contributes to weakening of your bones? Aging of your skin?  Yellowing of your teeth and nails?  CALCIUM AND VITAMIN D:  Adequate intake of calcium and Vitamin D are recommended.  The  recommendations for exact amounts of these supplements seem to change often, but generally speaking 600 mg of calcium (either carbonate or citrate) and 800 units of Vitamin D per day seems prudent. Certain women may benefit from higher intake of Vitamin D.  If you are among these women, your doctor will have told you during your visit.    PAP SMEARS:  Pap smears, to check for cervical cancer or precancers,  have traditionally been done yearly, although recent scientific advances have shown that most women can have pap smears less often.  However, every woman still should have a physical exam from her gynecologist every year. It will include a breast check, inspection of the vulva and vagina to check for abnormal growths or skin changes, a visual exam of the cervix, and then an exam to evaluate the size and shape of the uterus and ovaries.  And after 77 years of age, a rectal exam  is indicated to check for rectal cancers. We will also provide age appropriate advice regarding health maintenance, like when you should have certain vaccines, screening for sexually transmitted diseases, bone density testing, colonoscopy, mammograms, etc.   MAMMOGRAMS:  All women over 11 years old should have a yearly mammogram. Many facilities now offer a "3D" mammogram, which may cost around $50 extra out of pocket. If possible,  we recommend you accept the option to have the 3D mammogram performed.  It both reduces the number of women who will be called back for extra views which then turn out to be normal, and it is better than the routine mammogram at detecting truly abnormal areas.    COLONOSCOPY:  Colonoscopy to screen for colon cancer is recommended for all women at age 8.  We know, you hate the idea of the prep.  We agree, BUT, having colon cancer and not knowing it is worse!!  Colon cancer so often starts as a polyp that can be seen and removed at colonscopy, which can quite literally save your life!  And if your first  colonoscopy is normal and you have no family history of colon cancer, most women don't have to have it again for 10 years.  Once every ten years, you can do something that may end up saving your life, right?  We will be happy to help you get it scheduled when you are ready.  Be sure to check your insurance coverage so you understand how much it will cost.  It may be covered as a preventative service at no cost, but you should check your particular policy.

## 2024-06-29 NOTE — Telephone Encounter (Signed)
 Received notification from [employer] regarding a prior authorization for DUPIXENT. Authorization has been APPROVED from 06/29/24 to 07/14/25. Approval letter sent to scan center.  Per test claim, copay for 28 days supply is $100  Patient can fill through [facility]: [phone]   Authorization # [id number] Phone # [phone]   Will contact pt to see if she's ok with copay

## 2024-06-29 NOTE — Telephone Encounter (Signed)
 Received Dupixent new start paperwork. Submitted a Prior Authorization request to [employer] for DUPIXENT via CoverMyMeds. Will update once we receive a response.  Key: [id number]

## 2024-06-29 NOTE — Progress Notes (Signed)
 78 y.o. G53P2002 Married African American female here for a breast and pelvic exam.    No GYN concerns today.     From [place] .   2 daughters and 3 grandchildren.    PCP: Cyrena Gwenn SQUIBB, MD (Inactive)   Patient's last menstrual period was 05/10/1990.           Sexually active: Yes.    The current method of family planning is status post hysterectomy.    Menopausal hormone therapy:  n/a Exercising: Yes.    Walking  Smoker:  no  OB History     Gravida  2   Para  2   Term  2   Preterm      AB      Living  2      SAB      IAB      Ectopic      Multiple      Live Births  2           HEALTH MAINTENANCE: Last 2 paps: 2011 neg  History of abnormal Pap or positive HPV:  no Mammogram:  10/23/23 Breast Density Cat B, BIRADS Cat 1 neg  Colonoscopy:  2016 Bone Density:  12/10/22  Result  normal    Immunization History  Administered Date(s) Administered   Fluzone Influenza virus vaccine,trivalent (IIV3), split virus 04/15/2015, 04/15/2016   INFLUENZA, HIGH DOSE SEASONAL PF 05/06/2016, 05/22/2017, 04/14/2018, 05/18/2018, 04/27/2019, 04/04/2020   Influenza Split 05/27/2011, 04/10/2012, 04/14/2013, 04/14/2017   Influenza Whole 04/14/2009, 06/15/2010   Influenza,inj,Quad PF,6+ Mos 05/09/2014, 05/01/2015   PFIZER(Purple Top)SARS-COV-2 Vaccination 08/10/2019, 08/31/2019, 04/24/2020   Pneumococcal Conjugate-13 10/11/2014   Pneumococcal Polysaccharide-23 10/13/2015   Pneumococcal-Unspecified 10/14/2014      reports that she has never smoked. She has never used smokeless tobacco. She reports that she does not drink alcohol and does not use drugs.  Past Medical History:  Diagnosis Date   Acid reflux    Allergic rhinitis, cause unspecified    Arthritis    Asthma    Celiac disease    Chronic airway obstruction, not elsewhere classified    Family history of adverse reaction to anesthesia    patient's brother was 'slow' to wake up   GERD (gastroesophageal  reflux disease)    Hypertension    Osteopenia 07/2015   T score -1.5 FRAX 4%/0.5%   Other chronic sinusitis    Other hyperalimentation    Primary osteoarthritis of hip    Right   Unspecified asthma(493.90)    Urticaria    Vertigo     Past Surgical History:  Procedure Laterality Date   ABDOMINAL HYSTERECTOMY  1991   ABDOMINAL HYSTERECTOMY  leiomyomata   BREAST BIOPSY Left    BREAST SURGERY     Biopsy-benign   CATARACT EXTRACTION     RIGHT EYE   CESAREAN SECTION     CHOLECYSTECTOMY N/A 10/31/2014   Procedure: LAPAROSCOPIC CHOLECYSTECTOMY;  Surgeon: Donnice Bury, MD;  Location: [facility] OR;  Service: General;  Laterality: N/A;   DILATION AND CURETTAGE OF UTERUS     EYE SURGERY     DETACHED RETINA RIGHT EYE...    TOTAL ABDOMINAL HYSTERECTOMY W/ BILATERAL SALPINGOOPHORECTOMY     TOTAL HIP ARTHROPLASTY Right 09/02/2017   Procedure: TOTAL HIP ARTHROPLASTY ANTERIOR APPROACH;  Surgeon: Beverley Evalene BIRCH, MD;  Location: [facility] OR;  Service: Orthopedics;  Laterality: Right;    Current Outpatient Medications  Medication Sig Dispense Refill   acetaminophen  (TYLENOL ) 500 MG tablet Take 1  tablet (500 mg total) by mouth every 6 (six) hours as needed. 30 tablet 0   albuterol  (VENTOLIN  HFA) 108 (90 Base) MCG/ACT inhaler USE 2 PUFFS BY MOUTH EVERY 6 HOURS AS NEEDED FOR WHEEZING AND SHORTNESS OF BREATH 18 g 0   amLODipine  (NORVASC ) 2.5 MG tablet Take 2.5 mg by mouth daily.  3   Cholecalciferol (VITAMIN D3) 2000 units capsule Take 2,000 Units by mouth daily.      Cyanocobalamin  (VITAMIN B 12 PO) Take by mouth.     fexofenadine (ALLEGRA) 180 MG tablet Take 180 mg by mouth at bedtime.     fluticasone  (FLONASE ) 50 MCG/ACT nasal spray Use 2 sprays in each nostril twice daily as needed for congestion 16 mL 3   irbesartan  (AVAPRO ) 150 MG tablet Take 150 mg by mouth daily.  1   rosuvastatin  (CRESTOR ) 10 MG tablet Take 10 mg by mouth daily.     spironolactone  (ALDACTONE ) 25 MG tablet Take 25 mg by mouth  daily.  0   KLOR-CON  M20 20 MEQ tablet Take 20 mEq by mouth daily.  (Patient not taking: Reported on 06/29/2024)  3   omeprazole (PRILOSEC) 40 MG capsule Take 40 mg by mouth daily as needed (for acid reflux or heartburn).  (Patient not taking: Reported on 06/29/2024)  1   triamcinolone  (KENALOG) 0.025 % cream Apply 1 application topically 2 (two) times daily. (Patient not taking: Reported on 06/29/2024)     No current facility-administered medications for this visit.    ALLERGIES: Shellfish allergy , Atorvastatin, Montelukast  sodium, and Wheat  Family History  Problem Relation Age of Onset   Heart failure Mother    Heart disease Mother    Pancreatic cancer Father    Cancer Father 49       PANCREATIC (DECEASED)   Diabetes Brother        DEACESED.SABRA HAD A KIDNEY TRANSPLANT   Stroke Brother 66       DECEASED   Breast cancer Sister 66    Review of Systems  All other systems reviewed and are negative.   PHYSICAL EXAM:  BP 126/84 (BP Location: Left Arm, Patient Position: Sitting)   Pulse 83   Ht 5' 3 (1.6 m)   Wt 214 lb (97.1 kg)   LMP 05/10/1990   SpO2 96%   BMI 37.91 kg/m     General appearance: alert, cooperative and appears stated age Head: normocephalic, without obvious abnormality, atraumatic Neck: no adenopathy, supple, symmetrical, trachea midline and thyroid normal to inspection and palpation Lungs: clear to auscultation bilaterally Breasts: normal appearance, no masses or tenderness, No nipple retraction or dimpling, No nipple discharge or bleeding, No axillary adenopathy Heart: regular rate and rhythm Abdomen: vertical midline incision.  Abdomen is soft, non-tender; no masses, no organomegaly Extremities: extremities normal, atraumatic, no cyanosis or edema Skin: skin color, texture, turgor normal. No rashes or lesions Lymph nodes: cervical, supraclavicular, and axillary nodes normal. Neurologic: grossly normal  Pelvic: External genitalia:  no lesions               No abnormal inguinal nodes palpated.              Urethra:  normal appearing urethra with no masses, tenderness or lesions              Bartholins and Skenes: normal                 Vagina: normal appearing vagina with normal color and discharge, no lesions  Cervix: absent              Pap taken: no Bimanual Exam:  Uterus:  absent              Adnexa: no mass, fullness, tenderness              Rectal exam: yes.  Confirms.              Anus:  normal sphincter tone, no lesions  Chaperone was present for exam:  Kari HERO, CMA  ASSESSMENT: Encounter for breast and pelvic exam.  Status post TAH/BSO for fibroids.   PLAN: Mammogram screening discussed. Self breast awareness reviewed. Pap and HRV collected:  no.  Not indicated ASCCP guidelines reviewed. Guidelines for Calcium , Vitamin D , regular exercise program including cardiovascular and weight bearing exercise. Medication refills:  NA Labs with PCP.  Follow up:  2 years and as needed.

## 2024-07-02 ENCOUNTER — Encounter: Payer: Self-pay | Admitting: Internal Medicine

## 2024-07-10 ENCOUNTER — Other Ambulatory Visit: Payer: Self-pay | Admitting: Internal Medicine

## 2024-07-19 ENCOUNTER — Other Ambulatory Visit (HOSPITAL_COMMUNITY): Payer: Self-pay

## 2024-07-19 NOTE — Telephone Encounter (Signed)
 Test claim was still processing for $100 copay in 2026. Called pt and she is ok with paying the copay. Discussed enrolling in M3P so she would not be responsible for all of it upfront.   Pt stated she would feel comfortable waiting a few weeks before starting, towards end of January. Advised pt she should receive call from [place] to schedule her appointment. Sent message to pt detailing what we discussed, pt verbalized understanding to all.

## 2024-07-27 NOTE — Telephone Encounter (Signed)
 Pt enrolled in Asthma grant through [employer]:  BIN: [id number] PCN: [id number] Group: [id number] ID: [id number]

## 2024-07-30 ENCOUNTER — Ambulatory Visit

## 2024-07-30 DIAGNOSIS — J4489 Other specified chronic obstructive pulmonary disease: Secondary | ICD-10-CM

## 2024-07-30 MED ORDER — DUPIXENT 300 MG/2ML ~~LOC~~ SOAJ
SUBCUTANEOUS | 0 refills | Status: AC
  Filled 2024-08-02: qty 8, 28d supply, fill #0

## 2024-07-30 NOTE — Addendum Note (Signed)
 Addended by: Charlene Cowdrey L on: 07/30/2024 12:19 PM   Modules accepted: Orders

## 2024-07-30 NOTE — Telephone Encounter (Signed)
 See pharmacotherapy note 07/30/24. Initial counseling complete. First injection scheduled 08/10/24.

## 2024-08-02 ENCOUNTER — Other Ambulatory Visit: Payer: Self-pay

## 2024-08-02 ENCOUNTER — Other Ambulatory Visit (HOSPITAL_COMMUNITY): Payer: Self-pay

## 2024-08-02 NOTE — Progress Notes (Signed)
 Specialty Pharmacy Initial Fill Coordination Note  Bonnie Ellison is a 79 y.o. female contacted today regarding initial fill of specialty medication(s) Dupilumab  (Dupixent )   Patient requested Courier to Provider Office   Delivery date: 08/04/24   Verified address: [address], [place], [place] [address]   Medication will be filled on: 08/03/24   Patient is aware of $0 copayment.

## 2024-08-03 ENCOUNTER — Other Ambulatory Visit: Payer: Self-pay

## 2024-08-10 ENCOUNTER — Other Ambulatory Visit

## 2024-08-10 NOTE — Progress Notes (Signed)
 See clinical note 07/30/24 - patient was educated in detail on Dupixent  and scheduled for first injection in clinic on 08/17/24.   Aleck Puls, PharmD, BCPS, CPP Clinical Pharmacist  [facility]  [facility]

## 2024-08-17 ENCOUNTER — Other Ambulatory Visit

## 2024-08-18 ENCOUNTER — Ambulatory Visit: Payer: Self-pay

## 2024-08-18 ENCOUNTER — Ambulatory Visit

## 2024-08-18 VITALS — BP 136/74 | HR 83 | Temp 98.1°F | Ht 62.5 in | Wt 214.4 lb

## 2024-08-18 DIAGNOSIS — J4489 Other specified chronic obstructive pulmonary disease: Secondary | ICD-10-CM

## 2024-08-18 DIAGNOSIS — J329 Chronic sinusitis, unspecified: Secondary | ICD-10-CM

## 2024-08-18 MED ORDER — AEROCHAMBER PLUS FLO-VU MISC: 0 refills | Status: AC

## 2024-08-18 NOTE — Telephone Encounter (Signed)
 FYI Only or Action Required?: FYI only for provider: appointment scheduled on 08/18/24.  Patient is followed in Pulmonology for chronic sinusitits, last seen on 06/24/2024 by Neysa Reggy BIRCH, MD.  Called Nurse Triage reporting Cough.  Symptoms began 08/16/24.  Interventions attempted: Rescue inhaler.  Symptoms are: unchanged.  Triage Disposition: See Physician Within 24 Hours  Patient/caregiver understands and will follow disposition?: yes     Reason for Disposition  [1] Known COPD or other severe lung disease (i.e., bronchiectasis, cystic fibrosis, lung surgery) AND [2] symptoms getting worse (i.e., increased sputum purulence or amount, increased breathing difficulty  Answer Assessment - Initial Assessment Questions 1. ONSET: When did the cough begin?      Monday few days ago  2. SEVERITY: How bad is the cough today?      Worse with exertion, deep cough 3. SPUTUM: Describe the color of your sputum (e.g., none, dry cough; clear, white, yellow, green)     clear 4. HEMOPTYSIS: Are you coughing up any blood? If Yes, ask: How much? (e.g., flecks, streaks, tablespoons, etc.)     no 5. DIFFICULTY BREATHING: Are you having difficulty breathing? If Yes, ask: How bad is it? (e.g., mild, moderate, severe)      no 6. FEVER: Do you have a fever? If Yes, ask: What is your temperature, how was it measured, and when did it start?     no 7. CARDIAC HISTORY: Do you have any history of heart disease? (e.g., heart attack, congestive heart failure)      HTN 8. LUNG HISTORY: Do you have any history of lung disease?  (e.g., pulmonary embolus, asthma, emphysema)     COPD asthma  9. PE RISK FACTORS: Do you have a history of blood clots? (or: recent major surgery, recent prolonged travel, bedridden)     N/a 10. OTHER SYMPTOMS: Do you have any other symptoms? (e.g., runny nose, wheezing, chest pain)       Last night tightness center of back with coughing- better with rest,  clearing throat -tight 11. PREGNANCY: Is there any chance you are pregnant? When was your last menstrual period?       N/a 12. TRAVEL: Have you traveled out of the country in the last month? (e.g., travel history, exposures)       N/a  Protocols used: Cough - Acute Productive-A-AH

## 2024-08-18 NOTE — Patient Instructions (Addendum)
 It was good to see you today.  I am sorry you aren't feeling the best.  I believe your cough and shortness of breath is caused by your chronic sinusitis.  For mucus control do:   Saline rinse twice daily, morning and night   Flonase  two sprays per nostril twice daily, morning and night    You can use saline rinse in between for breakthrough congestion  Use Flonase  twice daily even when you aren't having symptoms.  You can use warm compresses and steam to help with sinus pain and congestion.  Continue taking antihistamine.  I have ordered a spacer to be picked up at your pharmacy.  Here is a video link with instructions for spacer use:  [email or web address]  You are scheduled for tomorrow with the pharmacist to discuss Dupixent  administration.  I hope Dupixent  will help to better control your symptoms. Follow up in April with Dr. Theodoro as scheduled.  Follow up sooner if you develop a fever, increased shortness of breath, and discolored secretions (yellow, tan, blood tinged).

## 2024-08-18 NOTE — Progress Notes (Addendum)
 "  Established Patient Pulmonology Office Visit   Subjective:  Patient ID: Bonnie Ellison, female    DOB: April 22, 1946  MRN: [id number]  Referred by: No ref. provider found  CC:  Chief Complaint  Patient presents with   Acute Visit    Cough/sob    HPI Bonnie Ellison is a 79 y.o. female with cough  Last seen by Dr. Neysa 06/24/2024 for follow up of chronic sinusitis, rhinitis, chronic obstructive asthma, and obesity hypoventilation. She is here today complaining of cough and shortness of breath that began Monday.   Discussed the use of AI scribe software for clinical note transcription with the patient, who gave verbal consent to proceed.  History of Present Illness Bonnie Ellison is a 79 year old female with chronic sinusitis and asthma who presents with increased coughing and sinus symptoms.  She was last seen by Dr. Neysa on 06/24/2024 for follow up of chronic sinusitis, rhinitis, chronic obstructive asthma, and obesity hypoventilation. Chronic sinusitis and allergic rhinitis managed with Flonase , Allegra, and as needed nasal saline lavage. Chronic obstructive asthma managed with albuterol  inhaler which she denies using regularly. IgE levels elevated in September, Dupixent  applied for in December and patient has an appointment 08/19/2024 with pharmacy to review medication administration. She is scheduled to establish with Dr. Theodoro in April.   She has experienced increased coughing and sinus symptoms since Monday. The cough began as clear and non-productive but intensified last night, causing shortness of breath and chest tightness. Albuterol  inhaler use provided some relief, especially when lying down, but the cough returned in the morning. She has only had to use her inhaler once. She has been using Allegra, Flonase  once daily, and saline lavage for symptom management. Steam from a shower temporarily alleviated her symptoms. She denies fever, discolored secretions, or wheezing.    She recently picked up a new albuterol  inhaler, which did not provide the usual relief, possibly due to a different manufacturer or because she had been experiencing symptoms for the last two days before using inhaler. Prior to this episode, she used her albuterol  inhaler infrequently, about once a month. She denies any recent increase in the use of her albuterol  inhaler, except for the current episode. She was prescribed Dupixent  for chronic obstructive asthma, she is scheduled to see the pharmacist tomorrow to review administration.     Review of Systems  Constitutional:  Negative for chills, fever and malaise/fatigue.  HENT:  Positive for congestion and sinus pain. Negative for ear pain and sore throat.   Eyes:  Negative for blurred vision, double vision and discharge.  Respiratory:  Positive for cough, sputum production (clear) and shortness of breath. Negative for wheezing.   Cardiovascular:  Negative for chest pain, palpitations and leg swelling.  Gastrointestinal:  Negative for constipation, diarrhea, nausea and vomiting.  Genitourinary:  Negative for flank pain, frequency and urgency.  Musculoskeletal:  Negative for falls, joint pain and myalgias.  Skin:  Negative for itching and rash.  Neurological:  Negative for dizziness, weakness and headaches.  Endo/Heme/Allergies:  Positive for environmental allergies. Negative for polydipsia. Does not bruise/bleed easily.  Psychiatric/Behavioral: Negative.      Allergies: Shellfish allergy , Atorvastatin, Montelukast  sodium, and Wheat Current Medications[1] Past Medical History:  Diagnosis Date   Acid reflux    Allergic rhinitis, cause unspecified    Arthritis    Asthma    Celiac disease    Chronic airway obstruction, not elsewhere classified    Family history of adverse reaction  to anesthesia    patient's brother was 'slow' to wake up   GERD (gastroesophageal reflux disease)    Hypertension    Osteopenia 07/2015   T score -1.5  FRAX 4%/0.5%   Other chronic sinusitis    Other hyperalimentation    Primary osteoarthritis of hip    Right   Unspecified asthma(493.90)    Urticaria    Vertigo    Past Surgical History:  Procedure Laterality Date   ABDOMINAL HYSTERECTOMY  1991   ABDOMINAL HYSTERECTOMY  leiomyomata   BREAST BIOPSY Left    BREAST SURGERY     Biopsy-benign   CATARACT EXTRACTION     RIGHT EYE   CESAREAN SECTION     CHOLECYSTECTOMY N/A 10/31/2014   Procedure: LAPAROSCOPIC CHOLECYSTECTOMY;  Surgeon: Donnice Bury, MD;  Location: [facility] OR;  Service: General;  Laterality: N/A;   DILATION AND CURETTAGE OF UTERUS     EYE SURGERY     DETACHED RETINA RIGHT EYE...    TOTAL ABDOMINAL HYSTERECTOMY W/ BILATERAL SALPINGOOPHORECTOMY     TOTAL HIP ARTHROPLASTY Right 09/02/2017   Procedure: TOTAL HIP ARTHROPLASTY ANTERIOR APPROACH;  Surgeon: Beverley Evalene BIRCH, MD;  Location: [facility] OR;  Service: Orthopedics;  Laterality: Right;   Family History  Problem Relation Age of Onset   Heart failure Mother    Heart disease Mother    Pancreatic cancer Father    Cancer Father 69       PANCREATIC (DECEASED)   Diabetes Brother        DEACESED.SABRA HAD A KIDNEY TRANSPLANT   Stroke Brother 98       DECEASED   Breast cancer Sister 22   Social History   Socioeconomic History   Marital status: Married    Spouse name: Not on file   Number of children: Not on file   Years of education: Not on file   Highest education level: Not on file  Occupational History   Occupation: retired-clerical work  Tobacco Use   Smoking status: Never   Smokeless tobacco: Never  Vaping Use   Vaping status: Never Used  Substance and Sexual Activity   Alcohol use: No    Alcohol/week: 0.0 standard drinks of alcohol   Drug use: No   Sexual activity: Yes    Birth control/protection: Surgical, Post-menopausal    Comment: HYST-1st intercourse 79 yo-Fewer than 5 partners, Less than 5, after 16, no STD, no abnormal pap, no cancer, no DES  Other  Topics Concern   Not on file  Social History Narrative   Not on file   Social Drivers of Health   Tobacco Use: Low Risk (08/18/2024)   Patient History    Smoking Tobacco Use: Never    Smokeless Tobacco Use: Never    Passive Exposure: Not on file  Financial Resource Strain: Not on file  Food Insecurity: Not on file  Transportation Needs: Not on file  Physical Activity: Not on file  Stress: Not on file  Social Connections: Not on file  Intimate Partner Violence: Not on file  Depression (EYV7-0): Not on file  Alcohol Screen: Not on file  Housing: Not on file  Utilities: Not on file  Health Literacy: Not on file       Objective:  LMP 05/10/1990    Physical Exam Constitutional:      Appearance: Normal appearance. She is obese.  HENT:     Head: Normocephalic and atraumatic.     Nose: Congestion present.     Right Turbinates: Enlarged  and swollen.     Left Turbinates: Enlarged and swollen.     Right Sinus: Maxillary sinus tenderness and frontal sinus tenderness present.     Left Sinus: Maxillary sinus tenderness and frontal sinus tenderness present.     Mouth/Throat:     Mouth: Mucous membranes are moist.     Pharynx: Posterior oropharyngeal erythema present.  Eyes:     Extraocular Movements: Extraocular movements intact.     Conjunctiva/sclera: Conjunctivae normal.     Pupils: Pupils are equal, round, and reactive to light.  Cardiovascular:     Rate and Rhythm: Normal rate and regular rhythm.     Pulses: Normal pulses.     Heart sounds: Normal heart sounds.  Pulmonary:     Effort: Pulmonary effort is normal.     Breath sounds: Normal breath sounds.  Abdominal:     General: Abdomen is flat. Bowel sounds are normal.     Palpations: Abdomen is soft.  Musculoskeletal:        General: Normal range of motion.     Cervical back: Normal range of motion and neck supple.  Skin:    General: Skin is warm and dry.     Capillary Refill: Capillary refill takes less than 2  seconds.  Neurological:     General: No focal deficit present.     Mental Status: She is alert and oriented to person, place, and time.  Psychiatric:        Mood and Affect: Mood normal.        Behavior: Behavior normal.    Diagnostic Review:  03/25/2024 IgE:  Component Ref Range & Units (hover) 4 mo ago 10 yr ago  IgE (Immunoglobulin E), Serum 670 High  709.3 High  R, CM   03/25/2024 CBC:     Component Value Date/Time   WBC 6.3 03/25/2024 1545   RBC 4.39 03/25/2024 1545   HGB 12.8 03/25/2024 1545   HCT 38.3 03/25/2024 1545   PLT 190.0 03/25/2024 1545   MCV 87.3 03/25/2024 1545   MCH 28.8 08/19/2017 1051   MCHC 33.5 03/25/2024 1545   RDW 14.3 03/25/2024 1545   LYMPHSABS 1.8 03/25/2024 1545   MONOABS 0.8 03/25/2024 1545   EOSABS 0.2 03/25/2024 1545   BASOSABS 0.0 03/25/2024 1545   08/19/2017 Last metabolic panel Lab Results  Component Value Date   GLUCOSE 97 08/19/2017   NA 141 08/19/2017   K 3.8 08/19/2017   CL 105 08/19/2017   CO2 24 08/19/2017   BUN 10 08/19/2017   CREATININE 0.64 08/19/2017   GFRNONAA >60 08/19/2017   CALCIUM  9.5 08/19/2017   PROT 4.5 (L) 11/14/2014   ALBUMIN 2.2 (L) 11/14/2014   BILITOT 1.0 11/14/2014   ALKPHOS 45 11/14/2014   AST 50 (H) 11/14/2014   ALT 41 11/14/2014   ANIONGAP 12 08/19/2017       Assessment & Plan:   Assessment & Plan Chronic sinusitis, unspecified location Recent exacerbation likely viral with postnasal drip and possible allergy  flare. - Increase Flonase  to two sprays each nostril twice daily. - Perform saline rinses before Flonase , morning and night. - Use saline rinses as needed, max four to five times daily. - Consider humidification and warm compresses on sinuses. - Add Mucinex if mucus thickens. - Monitor for fever, discolored secretions, or increased shortness of breath. - Follow up if no improvement in symptoms or you develop fever, increased shortness of breath, or discolored secretions.   Chronic  obstructive asthma (HCC) Orders:  Spacer/Aero-Holding Chambers (AEROCHAMBER PLUS) Device; Please dispense 1 spacer Well-controlled. Recent exacerbation likely due to sinusitis-related postnasal drip. Albuterol  inhaler may be less effective due to manufacturer variation or sinusitis irritation. - Ordered spacer for albuterol  inhaler. I have provided her with printed instructions and link to video instructions.  - Use albuterol  as needed for shortness of breath or chest tightness. - Monitor for increased albuterol  use.  - Start Dupixent  as scheduled with pharmacist assistance. - Follow up with Dr. Pawar as scheduled.   I spent 30 minutes dedicated to the care of this patient on the date of this encounter to include pre-visit review of records, face-to-face time with the patient discussing conditions above, post visit ordering of testing, clinical documentation with the electronic health record, making appropriate referrals as documented, and communicating necessary information to the patient's healthcare team.   No follow-ups on file.   Wells CHRISTELLA [place], FNP     [1]  Current Outpatient Medications:    acetaminophen  (TYLENOL ) 500 MG tablet, Take 1 tablet (500 mg total) by mouth every 6 (six) hours as needed., Disp: 30 tablet, Rfl: 0   albuterol  (VENTOLIN  HFA) 108 (90 Base) MCG/ACT inhaler, USE 2 PUFFS BY MOUTH EVERY 6 HOURS AS NEEDED FOR WHEEZING AND SHORTNESS OF BREATH, Disp: 18 g, Rfl: 1   amLODipine  (NORVASC ) 2.5 MG tablet, Take 2.5 mg by mouth daily., Disp: , Rfl: 3   Cholecalciferol (VITAMIN D3) 2000 units capsule, Take 2,000 Units by mouth daily. , Disp: , Rfl:    Cyanocobalamin  (VITAMIN B 12 PO), Take by mouth., Disp: , Rfl:    Dupilumab  (DUPIXENT ) 300 MG/2ML SOAJ, Inject contents of two pens (600mg ) in the skin on day 0 in clinic. Then inject contents of one pen (300mg ) in the skin on day 14 and every 14 days thereafter. Courier to pulm: [address], [place] [address].  Appt on 08/10/24., Disp: 8 mL, Rfl: 0   fexofenadine (ALLEGRA) 180 MG tablet, Take 180 mg by mouth at bedtime., Disp: , Rfl:    fluticasone  (FLONASE ) 50 MCG/ACT nasal spray, Use 2 sprays in each nostril twice daily as needed for congestion, Disp: 16 mL, Rfl: 3   irbesartan  (AVAPRO ) 150 MG tablet, Take 150 mg by mouth daily., Disp: , Rfl: 1   KLOR-CON  M20 20 MEQ tablet, Take 20 mEq by mouth daily.  (Patient not taking: Reported on 06/29/2024), Disp: , Rfl: 3   omeprazole (PRILOSEC) 40 MG capsule, Take 40 mg by mouth daily as needed (for acid reflux or heartburn).  (Patient not taking: Reported on 06/29/2024), Disp: , Rfl: 1   rosuvastatin  (CRESTOR ) 10 MG tablet, Take 10 mg by mouth daily., Disp: , Rfl:    spironolactone  (ALDACTONE ) 25 MG tablet, Take 25 mg by mouth daily., Disp: , Rfl: 0   triamcinolone  (KENALOG) 0.025 % cream, Apply 1 application topically 2 (two) times daily. (Patient not taking: Reported on 06/29/2024), Disp: , Rfl:   "

## 2024-08-18 NOTE — Assessment & Plan Note (Signed)
 Orders:   Spacer/Aero-Holding Chambers (AEROCHAMBER PLUS) Device; Please dispense 1 spacer Well-controlled. Recent exacerbation likely due to sinusitis-related postnasal drip. Albuterol  inhaler may be less effective due to manufacturer variation or sinusitis irritation. - Ordered spacer for albuterol  inhaler. I have provided her with printed instructions and link to video instructions.  - Use albuterol  as needed for shortness of breath or chest tightness. - Monitor for increased albuterol  use.  - Start Dupixent  as scheduled with pharmacist assistance. - Follow up with Dr. Pawar as scheduled.

## 2024-08-18 NOTE — Telephone Encounter (Signed)
" ° ° °  Message from Gaffney B sent at 08/18/2024  9:41 AM EST  Reason for Triage:  Complain of coughing, phlem (clear), tightness in back. Denies breathing issues.   Attempted to contact patient regarding symptoms- no answer- left message to call [facility] back "

## 2024-08-19 ENCOUNTER — Ambulatory Visit

## 2024-08-19 NOTE — Progress Notes (Unsigned)
 "  HPI Patient presents today to [facility] to see pharmacy team for Dupixent  new start.  Past medical history includes ***  Dr. Neysa in December  Number of hospitalizations in past year: Number of ***COPD/asthma exacerbations in past year:   Respiratory Medications Current regimen: *** - not using albuterol  frequently, not even monthly    Tried in past: *** Patient reports {Adherence challenges yes no:3044014::adherence challenges,no known adherence challenges}  OBJECTIVE Allergies[1]  Outpatient Encounter Medications as of 08/19/2024  Medication Sig   acetaminophen  (TYLENOL ) 500 MG tablet Take 1 tablet (500 mg total) by mouth every 6 (six) hours as needed.   albuterol  (VENTOLIN  HFA) 108 (90 Base) MCG/ACT inhaler USE 2 PUFFS BY MOUTH EVERY 6 HOURS AS NEEDED FOR WHEEZING AND SHORTNESS OF BREATH   amLODipine  (NORVASC ) 2.5 MG tablet Take 2.5 mg by mouth daily.   Cholecalciferol (VITAMIN D3) 2000 units capsule Take 2,000 Units by mouth daily.    Cyanocobalamin  (VITAMIN B 12 PO) Take by mouth.   Dupilumab  (DUPIXENT ) 300 MG/2ML SOAJ Inject contents of two pens (600mg ) in the skin on day 0 in clinic. Then inject contents of one pen (300mg ) in the skin on day 14 and every 14 days thereafter. Courier to pulm: [address], [place] [address]. Appt on 08/10/24.   fexofenadine (ALLEGRA) 180 MG tablet Take 180 mg by mouth at bedtime.   fluticasone  (FLONASE ) 50 MCG/ACT nasal spray Use 2 sprays in each nostril twice daily as needed for congestion   irbesartan  (AVAPRO ) 150 MG tablet Take 150 mg by mouth daily.   rosuvastatin  (CRESTOR ) 10 MG tablet Take 10 mg by mouth daily.   Spacer/Aero-Holding Chambers (AEROCHAMBER PLUS) Device Please dispense 1 spacer   spironolactone  (ALDACTONE ) 25 MG tablet Take 25 mg by mouth daily.   No facility-administered encounter medications on file as of 08/19/2024.     Immunization History  Administered Date(s) Administered   Fluzone  Influenza virus vaccine,trivalent (IIV3), split virus 04/15/2015, 04/15/2016   INFLUENZA, HIGH DOSE SEASONAL PF 05/06/2016, 05/22/2017, 04/14/2018, 05/18/2018, 04/27/2019, 04/04/2020, 04/18/2023, 04/29/2024   Influenza Split 05/27/2011, 04/10/2012, 04/14/2013, 04/14/2017   Influenza Whole 04/14/2009, 06/15/2010   Influenza,inj,Quad PF,6+ Mos 05/09/2014, 05/01/2015   Moderna Sars-Covid-2 Vaccination 08/10/2019, 08/31/2019   PFIZER(Purple Top)SARS-COV-2 Vaccination 08/10/2019, 08/31/2019, 04/24/2020   Pneumococcal Conjugate-13 10/11/2014   Pneumococcal Polysaccharide-23 10/13/2015   Pneumococcal-Unspecified 10/14/2014   Tdap 01/09/2024     PFTs     No data to display           Eosinophils Most recent blood eosinophil count was *** cells/microL taken on ***.   IgE: *** on ***   Assessment   Biologics training for dupilumab  (Dupixent )  Goals of therapy: Mechanism: human monoclonal IgG4 antibody that inhibits interleukin-4 and interleukin-13 cytokine-induced responses, including release of proinflammatory cytokines, chemokines, and IgE Reviewed that Dupixent  is add-on medication and patient must continue maintenance inhaler regimen. Response to therapy: may take 4 months to determine efficacy. Discussed that patients generally feel improvement sooner than 4 months.  Side effects: injection site reaction (6-18%), antibody development (5-16%), ophthalmic conjunctivitis (2-16%), transient blood eosinophilia (1-2%)  Dose: {dupixentdose:25708}  Administration/Storage:  Reviewed administration sites of thigh or abdomen (at least 2-3 inches away from abdomen). Reviewed the upper arm is only appropriate if caregiver is administering injection  Do not shake pen/syringe as this could lead to product foaming or precipitation. Do not use if solution is discolored or contains particulate matter or if window on prefilled pen  is yellow (indicates pen has been used).  Reviewed storage of  medication in refrigerator. Reviewed that Dupixent  can be stored at room temperature in unopened carton for up to 14 days.  Access: Approval of Dupixent  through: {specialtycoverage:25706} Patient enrolled into copay card program to help with copay assistance.  Patient self-administered Dupixent  300mg /45ml x 2 (total dose 600mg ) in right lower abdomen and left lower abdomen using [facility]-supplied medication  Dupixent  300mg /83mL autoinjector pen NDC: [phone] Lot: [id number] Expiration: 2026-10-13  Patient monitored for 30 minutes for adverse reaction.  Patient tolerated ***.  Injection site noted. {injectionreaction:30756}  Medication Reconciliation  A drug regimen assessment was performed, including review of allergies, interactions, disease-state management, dosing and immunization history. Medications were reviewed with the patient, including name, instructions, indication, goals of therapy, potential side effects, importance of adherence, and safe use.  Drug interaction(s): ***  PLAN Continue Dupixent  300mg  every 14 days.  Next dose is due *** and every 14 days thereafter. Rx sent to: [facility]: [phone] .  Patient provided with pharmacy phone number and advised to call later this week to schedule shipment to home. Patient provided with copay card information to provide to pharmacy if quoted copay exceeds $5 per month. Continue maintenance inhaler regimen of: ***  All questions encouraged and answered.  Instructed patient to reach out with any further questions or concerns.  Thank you for allowing pharmacy to participate in this patient's care.  This appointment required *** minutes of patient care (this includes precharting, chart review, review of results, face-to-face care, etc.).  Aleck Puls, PharmD, BCPS, CPP Clinical Pharmacist  [facility]  [facility]     [1]  Allergies Allergen Reactions   Shellfish  Allergy  Anaphylaxis and Other (See Comments)    throat swelling, hives   Atorvastatin Other (See Comments)    Leg cramps   Montelukast  Sodium Other (See Comments)    nightmares   Wheat Itching   "

## 2024-08-19 NOTE — Patient Instructions (Signed)
 Your next Dupixent  dose is due on 09/02/24, 09/16/24, and every 14 days thereafter  This medication is stored in the refrigerator. Please note, you should remove the drug from the refrigerator AT LEAST 30 minutes prior to injection to ensure the injection comes down to room temperature before injecting the medication.   If needed, this medication can be stored at room temperature for no longer than 14 days.   If you miss a dose, you should take it as soon as you remember it.   As a reminder, some side effects include injection site reaction, headache, and joint ache. If you are struggling with side effects, please call our office.   It can take several weeks to see the potential benefit of the medication. Please continue your other medications.   CONTINUE all other medications as prescribed. Dupixent  does NOT replace other medications.  Your prescription will be shipped from  [facility]: [phone].   Someone will call to schedule shipment and confirm address. They will mail your medication to your home.  You will need to be seen by your provider in 3 to 4 months to assess how Dupixent  is working for you. You have a follow-up appointment scheduled on 10/27/24 with Dr. Pawar.  Stay up to date on all routine vaccines: influenza, pneumonia, COVID19, Shingles  How to manage an injection site reaction: Remember the 5 C's: COUNTER - leave on the counter at least 30 minutes but up to overnight to bring medication to room temperature. This may help prevent stinging COLD - place something cold (like an ice gel pack or cold water bottle) on the injection site just before cleansing with alcohol. This may help reduce pain CLARITIN  - use Claritin  (generic name is loratadine ) for the first two weeks of treatment or the day of, the day before, and the day after injecting. This will help to minimize injection site reactions CORTISONE CREAM - apply if injection site is irritated and  itching CALL ME - if injection site reaction is bigger than the size of your fist, looks infected, blisters, or if you develop hives

## 2024-10-27 ENCOUNTER — Encounter
